# Patient Record
Sex: Female | Born: 1935 | Race: White | Hispanic: No | Marital: Married | Smoking: Former smoker
Health system: Southern US, Community
[De-identification: ages and names within clinical notes are randomized; demographics above are authoritative.]

## PROBLEM LIST (undated history)

## (undated) DIAGNOSIS — R251 Tremor, unspecified: Secondary | ICD-10-CM

## (undated) DIAGNOSIS — R413 Other amnesia: Secondary | ICD-10-CM

## (undated) DIAGNOSIS — M545 Low back pain, unspecified: Secondary | ICD-10-CM

## (undated) DIAGNOSIS — M48061 Spinal stenosis, lumbar region without neurogenic claudication: Principal | ICD-10-CM

## (undated) DIAGNOSIS — G5700 Lesion of sciatic nerve, unspecified lower limb: Secondary | ICD-10-CM

## (undated) DIAGNOSIS — E785 Hyperlipidemia, unspecified: Secondary | ICD-10-CM

## (undated) DIAGNOSIS — I10 Essential (primary) hypertension: Secondary | ICD-10-CM

## (undated) DIAGNOSIS — H269 Unspecified cataract: Secondary | ICD-10-CM

## (undated) DIAGNOSIS — R531 Weakness: Secondary | ICD-10-CM

## (undated) DIAGNOSIS — M797 Fibromyalgia: Secondary | ICD-10-CM

## (undated) HISTORY — DX: Low back pain, unspecified: M54.50

## (undated) HISTORY — PX: APPENDECTOMY: SHX54

## (undated) HISTORY — DX: Low back pain: M54.5

## (undated) HISTORY — DX: Tremor, unspecified: R25.1

## (undated) HISTORY — PX: EYE SURGERY: SHX253

## (undated) HISTORY — DX: Other amnesia: R41.3

## (undated) HISTORY — DX: Weakness: R53.1

## (undated) HISTORY — DX: Lesion of sciatic nerve, unspecified lower limb: G57.00

## (undated) HISTORY — PX: COLONOSCOPY W/ POLYPECTOMY: SHX1380

## (undated) HISTORY — PX: CATARACT EXTRACTION W/ INTRAOCULAR LENS  IMPLANT, BILATERAL: SHX1307

## (undated) HISTORY — DX: Fibromyalgia: M79.7

## (undated) HISTORY — DX: Unspecified cataract: H26.9

## (undated) HISTORY — DX: Spinal stenosis, lumbar region without neurogenic claudication: M48.061

---
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
## 1961-07-26 HISTORY — PX: ABDOMINAL HYSTERECTOMY: SHX81

## 1972-07-26 HISTORY — PX: COLON SURGERY: SHX602

## 1985-07-26 HISTORY — PX: OTHER SURGICAL HISTORY: SHX169

## 1988-07-26 HISTORY — PX: SMALL INTESTINE SURGERY: SHX150

## 1997-10-24 ENCOUNTER — Other Ambulatory Visit: Admission: RE | Admit: 1997-10-24 | Discharge: 1997-10-24 | Payer: Self-pay | Admitting: *Deleted

## 1997-11-14 ENCOUNTER — Other Ambulatory Visit: Admission: RE | Admit: 1997-11-14 | Discharge: 1997-11-14 | Payer: Self-pay | Admitting: *Deleted

## 1998-02-13 ENCOUNTER — Other Ambulatory Visit: Admission: RE | Admit: 1998-02-13 | Discharge: 1998-02-13 | Payer: Self-pay | Admitting: Internal Medicine

## 1998-02-13 ENCOUNTER — Other Ambulatory Visit: Admission: RE | Admit: 1998-02-13 | Discharge: 1998-02-13 | Payer: Self-pay | Admitting: *Deleted

## 2000-07-26 HISTORY — PX: COLON SURGERY: SHX602

## 2001-05-23 ENCOUNTER — Encounter (INDEPENDENT_AMBULATORY_CARE_PROVIDER_SITE_OTHER): Payer: Self-pay | Admitting: *Deleted

## 2001-05-23 ENCOUNTER — Ambulatory Visit (HOSPITAL_COMMUNITY): Admission: RE | Admit: 2001-05-23 | Discharge: 2001-05-23 | Payer: Self-pay | Admitting: Gastroenterology

## 2003-10-07 ENCOUNTER — Ambulatory Visit (HOSPITAL_COMMUNITY): Admission: RE | Admit: 2003-10-07 | Discharge: 2003-10-07 | Payer: Self-pay | Admitting: Specialist

## 2005-10-14 ENCOUNTER — Encounter: Admission: RE | Admit: 2005-10-14 | Discharge: 2005-11-22 | Payer: Self-pay | Admitting: *Deleted

## 2005-11-23 ENCOUNTER — Encounter: Admission: RE | Admit: 2005-11-23 | Discharge: 2006-02-21 | Payer: Self-pay | Admitting: *Deleted

## 2006-02-11 ENCOUNTER — Emergency Department (HOSPITAL_COMMUNITY): Admission: EM | Admit: 2006-02-11 | Discharge: 2006-02-11 | Payer: Self-pay | Admitting: Emergency Medicine

## 2007-06-27 ENCOUNTER — Ambulatory Visit (HOSPITAL_BASED_OUTPATIENT_CLINIC_OR_DEPARTMENT_OTHER): Admission: RE | Admit: 2007-06-27 | Discharge: 2007-06-27 | Payer: Self-pay | Admitting: Urology

## 2007-07-27 HISTORY — PX: KNEE SURGERY: SHX244

## 2007-08-18 ENCOUNTER — Ambulatory Visit: Payer: Self-pay | Admitting: Sports Medicine

## 2007-08-18 DIAGNOSIS — M19041 Primary osteoarthritis, right hand: Secondary | ICD-10-CM | POA: Insufficient documentation

## 2007-08-18 DIAGNOSIS — M19042 Primary osteoarthritis, left hand: Secondary | ICD-10-CM

## 2007-08-18 DIAGNOSIS — I10 Essential (primary) hypertension: Secondary | ICD-10-CM

## 2007-08-18 DIAGNOSIS — M216X9 Other acquired deformities of unspecified foot: Secondary | ICD-10-CM

## 2007-08-18 DIAGNOSIS — Z860100 Personal history of colon polyps, unspecified: Secondary | ICD-10-CM | POA: Insufficient documentation

## 2007-08-18 DIAGNOSIS — M2170 Unequal limb length (acquired), unspecified site: Secondary | ICD-10-CM

## 2007-10-30 ENCOUNTER — Inpatient Hospital Stay (HOSPITAL_COMMUNITY): Admission: AD | Admit: 2007-10-30 | Discharge: 2007-11-02 | Payer: Self-pay | Admitting: Orthopedic Surgery

## 2010-03-18 ENCOUNTER — Encounter: Admission: RE | Admit: 2010-03-18 | Discharge: 2010-03-18 | Payer: Self-pay | Admitting: Orthopedic Surgery

## 2010-07-17 ENCOUNTER — Encounter
Admission: RE | Admit: 2010-07-17 | Discharge: 2010-07-17 | Payer: Self-pay | Source: Home / Self Care | Attending: Orthopedic Surgery | Admitting: Orthopedic Surgery

## 2010-07-26 HISTORY — PX: LUMBAR LAMINECTOMY: SHX95

## 2010-08-19 ENCOUNTER — Encounter
Admission: RE | Admit: 2010-08-19 | Discharge: 2010-08-19 | Payer: Self-pay | Source: Home / Self Care | Attending: Orthopedic Surgery | Admitting: Orthopedic Surgery

## 2010-10-29 ENCOUNTER — Other Ambulatory Visit: Payer: Self-pay | Admitting: Orthopedic Surgery

## 2010-10-29 DIAGNOSIS — M48061 Spinal stenosis, lumbar region without neurogenic claudication: Secondary | ICD-10-CM

## 2010-11-04 ENCOUNTER — Ambulatory Visit
Admission: RE | Admit: 2010-11-04 | Discharge: 2010-11-04 | Disposition: A | Discharge status: Home / Self Care | Payer: Self-pay | Source: Ambulatory Visit | Attending: Orthopedic Surgery | Admitting: Orthopedic Surgery

## 2010-11-04 DIAGNOSIS — M48061 Spinal stenosis, lumbar region without neurogenic claudication: Secondary | ICD-10-CM

## 2010-12-08 NOTE — Op Note (Signed)
NAMECARLINA, [name]                   ACCOUNT NO.:  [id number]   MEDICAL RECORD NO.:  [id number]          PATIENT TYPE:  AMB   LOCATION:  [facility]                         FACILITY:  [facility]   PHYSICIAN:  Jamison Neighbor, M.D.  DATE OF BIRTH:  [birth year]   DATE OF PROCEDURE:  06/27/2007  DATE OF DISCHARGE:                               OPERATIVE REPORT   PREOPERATIVE DIAGNOSES:  1. Interstitial cystitis.  2. Urgency incontinence.   POSTOPERATIVE DIAGNOSES:  1. Interstitial cystitis.  2. Urgency incontinence.   PROCEDURE:  Cystoscopy, urethral calibration, hydrodistention of bladder  Marcaine and Pyridium installation, Marcaine and Kenalog injection,  Botox injections x2.   SURGEON:  Jamison Neighbor, M.D.   ANESTHESIA:  General.   COMPLICATIONS:  None.   DRAINS:  None.   BRIEF HISTORY:  This 77 year old female has had more problems with  interstitial cystitis as well as urgency incontinence.  The patient is  interested in seeing what could be done to help with the poorly  controlled urgency.  She has not responded well to IC type therapy in  terms of control of her frequency.  Anticholinergics have not worked  well and the patient was given three options.  The first was to be  considered for InterStim.  The second was to be considered for  percutaneous tibial nerve stimulation and third was to try Botox.  The  patient was opposed to the possibility of having an InterStim implant  placed.  She liked idea of percutaneous tibial nerve stimulation but  this is currently not covered by her insurance carrier and we could not  get her into a clinical trial.  The patient, however is interested in a  Botox injections.  She knows that this will have to be done at the same  time as the hydrodistention.  She is aware of potential risk for  retention.  She is willing to wear a catheter and do self-  catheterization if need be.  Full informed consent was obtained.   PROCEDURE:  After successful  induction of general anesthesia, the  patient was placed in the dorsal position, prepped with Betadine and  draped in the usual sterile fashion.  Careful bimanual examination  revealed a very modest cystocele, modest rectocele with no real vault  prolapse.  No enterocele and pretty good support for urethra.  There was  nothing suggestive of urethral diverticulum or other irregularities.  Urethra was calibrated to 32-French with female urethral sounds with no  evidence of stenosis or stricture.  The cystoscope was inserted.  The  bladder was carefully inspected.  No tumors or stones could be seen.  Both ureteral orifices normal configuration and location.  Hydrodistention of bladder was performed.  The bladder was distended at  a pressure of 100 cm of water for five minutes.  When the bladder was  drained, glomerulations could be seen throughout the bladder.  There was  one area that was slightly ulcerated.  The patient also had white patchy  areas consistent with fibrous tissue from longstanding bladder problems.  The patient had nothing  that was suspicious for carcinoma in situ or  that required biopsy.  The patient then underwent Botox injections x2  with total of 20 injections scattered throughout the bladder including  the trigone.  The bladder was drained.  A mixture of Marcaine and  Pyridium was left in the bladder Marcaine and Kenalog were injected  periurethrally.  The patient tolerated the procedure well and was taken  to recovery room in good condition.  She will be sent home with Lorcet  Plus, Pyridium Plus and doxycycline and return to see [place] in follow-up.  Will make sure that she can urinate normally before discharge and if  need be she can go home with a leg bag and catheter.      Jamison Neighbor, M.D.  Electronically Signed     RJE/MEDQ  D:  06/27/2007  T:  06/27/2007  Job:  [id number]

## 2010-12-08 NOTE — Op Note (Signed)
NAMEAGAPE, HARDIMAN                   ACCOUNT NO.:  [id number]   MEDICAL RECORD NO.:  [id number]          PATIENT TYPE:  INP   LOCATION:  2550                         FACILITY:  [facility]   PHYSICIAN:  Mila Homer. Sherlean Foot, M.D. DATE OF BIRTH:  [birth year]   DATE OF PROCEDURE:  10/30/2007  DATE OF DISCHARGE:                               OPERATIVE REPORT   SURGEON:  Mila Homer. Sherlean Foot, M.D.   ASSISTANT:  Legrand Pitts. Duffy, P.A.   ANESTHESIA:  Spinal.   PREOPERATIVE DIAGNOSIS:  Left knee osteoarthritis.   POSTOPERATIVE DIAGNOSIS:  Left knee osteoarthritis.   PROCEDURE:  Left total knee arthroplasty.   INDICATIONS FOR PROCEDURE:  The patient is a 77 year old white female  who failed conservative measures for osteoarthritis of the left knee.  Informed consent was obtained.   DESCRIPTION OF PROCEDURE:  The patient laid supine and administered  spinal anesthesia.  A Foley catheter placed and the left leg was prepped  and prepped in the usual sterile fashion.  The extremity was  exsanguinated with the Esmarch and tourniquet inflated to 350 mmHg.  A  #10 blade was used to make a midline incision approximately 6 inches  in  length.  A new blade was used to make a medial parapatellar arthrotomy  and perform a synovectomy.  The deep MCL was elevated off the medial  crest of the tibia, but not going much distally since this was a valgus  knee.  I then everted the patella and reamed down to 12 mm because it  was 21 mm thick and drilled three lug holes.  With 35 mm template in  place, we created a 21-mm thickness.  Then went into flexion and  subluxed the patella laterally.  I then used the intermedullary system  on the tibia to make a perpendicular cut to the anatomic axis of the  tibia.  I used the intramedullary system on the femur to make a 4 degree  valgus cut on the distal femur.  I marked out the epicondylar angle and  the posterior condylar angle measured 3 degrees.  I sized to a size E  pinned  it to the 3 degrees external rotation holes.  I then made the  anterior-posterior chamfer cuts through the E cutting block.  I then  placed a lamina spreader in the knee and removed the ACL, PCL, medial  and lateral menisci and posterior condylar osteophytes.  I then placed  the 2-mm spacer block in the knee and then finished the femur with a  size E finishing block, tibial size 5, tibial tray drilled and keeled.  Then trialed with a size 5 tibial sizer, femur size thin insert size 35.  Patella had good flexion/extension gap, balanced, and angle back to 125  degrees to 130 degrees and copiously irrigated.  I removed the trial  components and copiously irrigated.  I then cemented in the components,  removed excess cement with the leg in extension and I snapped in the  real  polyethylene and allowed the cement harden in extension.  I left a  Hemovac  coming out superolateral  deep to the arthrotomy.  I left a pain  catheter coming out superomedially and superficial to the arthrotomy.  I  let the tourniquet down and obtained hemostasis.  I copiously irrigated  again.  I closed the arthrotomy with figure-of-eight #1 Vicryl sutures,  deep soft tissue with buried 0 Vicryl suture, subcuticular with 2-0  Vicryl stitch and skin staples.  Dressed with Xeroform dressings,  sponges, Webril and TED stocking.   COMPLICATIONS:  None.   DRAINS:  One Hemovac and one pain catheter.   ESTIMATED BLOOD LOSS:  300 mL.   TOURNIQUET TIME:  45 minutes.           ______________________________  Mila Homer Sherlean Foot, M.D.     SDL/MEDQ  D:  10/30/2007  T:  10/30/2007  Job:  [id number]

## 2010-12-11 NOTE — Procedures (Signed)
[place]  Patient:    Kristie [name], Kristie [name] Visit Number: [id number] MRN: [id number]          Service Type: END Location: ENDO Attending Physician:  Charna Elizabeth Dictated by:   Anselmo Rod, M.D. Proc. Date: 05/23/01 Admit Date:  05/23/2001   CC:         Lilia Pro, M.D.   Procedure Report  DATE OF BIRTH:  04-Jul-1929.  PROCEDURE:  Colonoscopy with snare polypectomy x 2.  ENDOSCOPIST:  Anselmo Rod, M.D.  INSTRUMENT USED:  Olympus video colonoscope (pediatric).  INDICATION FOR PROCEDURE:  Personal history of polyps in a 77 year old white female.  Rule out recurrent polyps.  PREPROCEDURE PREPARATION:  Informed consent was procured from the patient. The patient was fasted for eight hours prior to the procedure and prepped with a bottle of magnesium citrate and a gallon of NuLytely the night prior to the procedure.  PREPROCEDURE PHYSICAL:  VITAL SIGNS:  The patient had stable vital signs.  NECK:  Supple.  CHEST:  Clear to auscultation.  S1, S2 regular.  ABDOMEN:  Soft with normal bowel sounds.  DESCRIPTION OF PROCEDURE:  The patient was placed in the left lateral decubitus position and sedated with 100 mcg of fentanyl and 10 mg of Versed intravenously.  Once the patient was adequately sedate and maintained on low-flow oxygen and continuous cardiac monitoring, the Olympus video colonoscope was advanced from the rectum to the cecum with slight difficulty secondary to a very tortuous colon.  Small internal hemorrhoids were appreciated on retroflexion in the rectum.  A small polyp was snared from 30 cm, and another sessile polyp was snared from the proximal right colon.  There was extensive left-sided diverticulosis.  No large masses or polyps were seen.  IMPRESSION: 1. A small, nonbleeding internal hemorrhoid. 2. Left-sided diverticulosis. 3. Two small polyps snared from the colon, one from the left colon and one    from  the right colon.  RECOMMENDATIONS: 1. Await pathology results. 2. Repeat colorectal cancer screening depending on the pathology results. 3. A high-fiber diet. 4. Outpatient follow-up on a p.r.n. basis. Dictated by:   Anselmo Rod, M.D. Attending Physician:  Charna Elizabeth DD:  05/23/01 TD:  05/24/01 Job: [id number]

## 2010-12-11 NOTE — Discharge Summary (Signed)
NAMEADRAINE, BIFFLE                   ACCOUNT NO.:  [id number]   MEDICAL RECORD NO.:  [id number]          PATIENT TYPE:  INP   LOCATION:  5040                         FACILITY:  [facility]   PHYSICIAN:  Erskine Squibb B. Su Hilt, P.A.  DATE OF BIRTH:  08/12/1928   DATE OF ADMISSION:  10/30/2007  DATE OF DISCHARGE:  11/02/2007                               DISCHARGE SUMMARY   ADMITTING SURGEON:  Dr. Georgena Spurling M.D.   FINAL DIAGNOSIS:  End-stage osteoarthritis, left knee.   PROCEDURE WHILE IN HOSPITAL:  Left total knee arthroplasty.   HISTORY OF PRESENT ILLNESS:  The patient is a 77 year old woman who  presents with a 2-3 year history of gradually increasing left knee pain.  No preceding injury.  Pain is constant ache which varies in severity  from short to diffuse about the joint.  No radiation.  Increased pain  with intensive stairs or prolonged use, and is adequate-to-extremely  stiff after long periods of sitting.  Decreased pain with increased  range of motion, Mobic, and rest.  The patient has reported positive  catching and near giving way as well as night pain that prevents sleep.  Cortisone gave temporary relief to the injection.  She also uses a cane  secondary to foot drop on the right leg.  After discussing risks versus  benefits of the surgery, the patient wishes to proceed with a total knee  arthroplasty for her left knee.   ALLERGIES:  The patient's medical history is significant for allergies  to DEMEROL.   PAST MEDICAL HISTORY:  She has previous medical history for,  1. Hypertension.  2. Fibromyalgia.  3. Osteopenia.  4. Anxiety.  5. Heart murmur.  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  7. Overactive bladder.   PAST SURGICAL HISTORY:  1. Tonsillectomy.  2. Thyroid cyst.  3. Hysterectomy.  4. Appendectomy.  5. Colon polyp.  6. Schwannoma.  7. Bowel obstruction.  8. Colon polyps.  9. Cataracts.  10.No complications from  surgery.   MEDICATIONS UPON ADMISSION:  1. Neurontin.  2. Mobic.  3. Zoloft.  4. Atenolol.  5. Norvasc.  6. Ambien.  7. Fosamax.  8. VESIcare.  9. Aricept.   SOCIAL HISTORY:  The patient last smoked 1 pack per day in the 1980s.  The patient has approximately 2 alcohol beverages per day.  She does not  use any other substances.   FAMILY HISTORY:  She reports family history of mother who died in her  64s with a history of heart disease, hypertension, and breast cancer.  Father died in his 31s with a history of Alzheimer.   REVIEW OF SYSTEMS:  Positive for blurred vision, cataracts, glasses,  high blood pressure, heart murmur, cancer, and partial paralysis due to  the schwannoma.   PREOPERATIVE LABS:  Including CBC, CMET, chest x-ray, EKG, PT, and PTT  were within normal limits with exception of hemoglobin of 15.2.   HOSPITAL COURSE:  On the day of admission, the patient was taken to the  operating  room at [facility] where she underwent a left total knee  arthroplasty using NexGen components from [employer] and #5 Stem tibia, a  35, 9.0-mm thickness all poly patella, a size E to the left femoral  component with a 10 mm posterior stabilized spacer.  All components  cemented.  The patient was placed on perioperative antibiotics.  She was  placed on postoperative Lovenox for prophylaxis of DVT.  She was placed  on a  Marcaine pain pump for pain control and physical therapy was begun in  the PACU using CPM.  Postop day #1, the patient was afebrile.  Hemoglobin 11.9.  Other vitals stable.  The patient complained of pain,  well controlled by pain medication.  Her Hemovac was discontinued.  Wound was clean and dry.  Marcaine pump continued for pain control and  was otherwise stable.  After her total knee, physical therapy was begun  in earnest with the physical therapist and occupational therapist.  Postoperative day 2, the patient continued to report moderate pain.  A  Foley was  discontinued without difficulty.  Hemoglobin 10.9, T-max  100.1, respirations 18, pulse 103, blood pressure 135/77.  Dressing was  changed.  Marcaine pump was discontinued.  Calf remained soft and  nontender.  She is otherwise neurovascularly intact at her baseline with  a chronic right foot drop.  Postoperative day #3, the patient's pain had  decreased to 2.  Was tolerating her diet.  T-max 101.6 but had wrenched  down to 98.6, pulse of 84, hemoglobin 10.0, WBC 7.3.  The patient was  otherwise alert and oriented with clean wound.  Calf was soft and  nontender.  She was neurovascularly intact with the exception of chronic  right foot drop.  She had made steady physical therapy progress and was  transferring independently and safely ambulating with only minimal  assist using her walker.  She was judged to be safe to return to her  home, so she was discharged home under the care of her family.  She will  continue with physical therapy which will be provided by her home health  agency.  She will continue to use her CPM and should be set up for home  use.  She will use a walker for ambulation and activity weightbearing as  tolerated with total knee precautions.  She will continue to use TED  hose and was encouraged to ambulate and exercise regularly.   MEDICATIONS AT THE TIME OF DISCHARGE:  1. VESIcare 10 mg p.o. daily.  2. Sertraline 50 mg one-half tablet daily.  3. Alendronate 70 mg every week.  4. Benicar 20 mg daily  5. Amlodipine 5 mg daily  6. Gabapentin 800 mg 3 times a day.  7. Aricept 10 mg daily.  8. Zolpidem 10 mg one-half tablet nightly.  9. Meloxicam 15 mg one-half tablet daily.  10.Vitamin D once weekly 50,000 units.  11.Primadophilus daily.  12.Multivitamins daily.  13.Tums Ultra as necessary.  14.She was given prescriptions for Lovenox 40 mg subcu daily as well      as Vicodin 1 p.o. q.4-6 h. p.r.n. pain.   At the time of discharge, she was weightbearing as tolerated.   Dressing  changes daily.  She should return to see Dr. Sherlean Foot in 2 week's time for  followup check or sooner should she have any increase in drainage that  is purulent or odoriferous, any type of pain that is not well controlled  by oral pain medications.  Dressing changes daily.  Diet is regular.  The patient will continue with CPM until she is bending easily, 0-90.  She will continue with home health PT.   FINAL DIAGNOSIS AT DISCHARGE:  End-stage arthritis of the left knee.   PROCEDURE WHILE IN HOSPITAL:  Left total knee arthroplasty.      Laural Benes. Judithe Modest  D:  11/27/2007  T:  11/28/2007  Job:  [id number]

## 2011-01-12 ENCOUNTER — Other Ambulatory Visit: Payer: Self-pay | Admitting: Orthopedic Surgery

## 2011-01-12 DIAGNOSIS — M48061 Spinal stenosis, lumbar region without neurogenic claudication: Secondary | ICD-10-CM

## 2011-01-18 ENCOUNTER — Other Ambulatory Visit: Payer: Medicare Other

## 2011-02-01 ENCOUNTER — Ambulatory Visit
Admission: RE | Admit: 2011-02-01 | Discharge: 2011-02-01 | Disposition: A | Discharge status: Home / Self Care | Payer: Medicare Other | Source: Ambulatory Visit | Attending: Orthopedic Surgery | Admitting: Orthopedic Surgery

## 2011-02-01 DIAGNOSIS — M48061 Spinal stenosis, lumbar region without neurogenic claudication: Secondary | ICD-10-CM

## 2011-04-20 LAB — COMPREHENSIVE METABOLIC PANEL
AST: 22
Calcium: 9.6
Chloride: 110
Creatinine, Ser: 0.78
Glucose, Bld: 96
Potassium: 4.1
Sodium: 143

## 2011-04-20 LAB — BASIC METABOLIC PANEL
Calcium: 8.6
Calcium: 8.6
Chloride: 102
Creatinine, Ser: 0.66
Creatinine, Ser: 0.81
GFR calc Af Amer: >60
GFR calc Af Amer: >60
GFR calc Af Amer: >60
GFR calc non Af Amer: >60
Potassium: 3.4 — ABNORMAL LOW
Sodium: 136
Sodium: 141

## 2011-04-20 LAB — DIFFERENTIAL
Basophils Absolute: 0
Basophils Relative: 1
Eosinophils Absolute: 0.1
Eosinophils Relative: 1
Lymphocytes Relative: 39
Lymphs Abs: 2.2
Monocytes Relative: 10
Neutro Abs: 2.8

## 2011-04-20 LAB — CBC
HCT: 28.9 — ABNORMAL LOW
HCT: 35.2 — ABNORMAL LOW
Hemoglobin: 10 — ABNORMAL LOW
Hemoglobin: 10.9 — ABNORMAL LOW
Hemoglobin: 15.2 — ABNORMAL HIGH
MCHC: 33.9
MCV: 92.5
MCV: 92.6
Platelets: 146 — ABNORMAL LOW
Platelets: 186
RBC: 3.14 — ABNORMAL LOW
RBC: 3.45 — ABNORMAL LOW
RDW: 13.3
RDW: 13.4
RDW: 13.7
RDW: 13.8

## 2011-04-20 LAB — CROSSMATCH

## 2011-04-20 LAB — PROTIME-INR
INR: 0.9
Prothrombin Time: 12.4

## 2011-04-20 LAB — URINALYSIS, ROUTINE W REFLEX MICROSCOPIC
Hgb urine dipstick: NEGATIVE
Ketones, ur: NEGATIVE
Nitrite: NEGATIVE
Protein, ur: NEGATIVE

## 2011-04-20 LAB — URINE CULTURE

## 2011-04-20 LAB — ABO/RH: ABO/RH(D): O POS

## 2011-05-03 LAB — POCT I-STAT 4, (NA,K, GLUC, HGB,HCT)
Glucose, Bld: 107 — ABNORMAL HIGH
Hemoglobin: 15.6 — ABNORMAL HIGH
Operator id: 114531
Sodium: 139

## 2011-05-05 ENCOUNTER — Other Ambulatory Visit: Payer: Self-pay | Admitting: Orthopedic Surgery

## 2011-05-05 DIAGNOSIS — M545 Low back pain: Secondary | ICD-10-CM

## 2011-05-05 DIAGNOSIS — M47819 Spondylosis without myelopathy or radiculopathy, site unspecified: Secondary | ICD-10-CM

## 2011-05-10 ENCOUNTER — Ambulatory Visit
Admission: RE | Admit: 2011-05-10 | Discharge: 2011-05-10 | Disposition: A | Discharge status: Home / Self Care | Payer: Medicare Other | Source: Ambulatory Visit | Attending: Orthopedic Surgery | Admitting: Orthopedic Surgery

## 2011-05-10 DIAGNOSIS — M545 Low back pain: Secondary | ICD-10-CM

## 2011-05-10 DIAGNOSIS — M47819 Spondylosis without myelopathy or radiculopathy, site unspecified: Secondary | ICD-10-CM

## 2011-05-10 MED ORDER — METHYLPREDNISOLONE ACETATE 40 MG/ML INJ SUSP (RADIOLOG
120.0000 mg | Freq: Once | INTRAMUSCULAR | Status: AC
  Administered 2011-05-10: 120 mg via EPIDURAL

## 2011-05-10 MED ORDER — IOHEXOL 180 MG/ML  SOLN
1.0000 mL | Freq: Once | INTRAMUSCULAR | Status: AC | PRN
  Administered 2011-05-10: 1 mL via EPIDURAL

## 2011-09-15 DIAGNOSIS — N3941 Urge incontinence: Secondary | ICD-10-CM | POA: Insufficient documentation

## 2012-04-13 ENCOUNTER — Encounter (HOSPITAL_COMMUNITY): Payer: Self-pay | Admitting: *Deleted

## 2012-04-13 ENCOUNTER — Emergency Department (HOSPITAL_COMMUNITY): Payer: Medicare Other

## 2012-04-13 ENCOUNTER — Inpatient Hospital Stay (HOSPITAL_COMMUNITY)
Admission: EM | Admit: 2012-04-13 | Discharge: 2012-04-16 | DRG: 536 | Disposition: A | Discharge status: Home Health | Payer: Medicare Other | Attending: Internal Medicine | Admitting: Internal Medicine

## 2012-04-13 DIAGNOSIS — M24873 Other specific joint derangements of unspecified ankle, not elsewhere classified: Secondary | ICD-10-CM

## 2012-04-13 DIAGNOSIS — R110 Nausea: Secondary | ICD-10-CM

## 2012-04-13 DIAGNOSIS — W010XXA Fall on same level from slipping, tripping and stumbling without subsequent striking against object, initial encounter: Secondary | ICD-10-CM | POA: Diagnosis present

## 2012-04-13 DIAGNOSIS — G609 Hereditary and idiopathic neuropathy, unspecified: Secondary | ICD-10-CM | POA: Diagnosis present

## 2012-04-13 DIAGNOSIS — E785 Hyperlipidemia, unspecified: Secondary | ICD-10-CM

## 2012-04-13 DIAGNOSIS — Y998 Other external cause status: Secondary | ICD-10-CM

## 2012-04-13 DIAGNOSIS — I10 Essential (primary) hypertension: Secondary | ICD-10-CM

## 2012-04-13 DIAGNOSIS — M2170 Unequal limb length (acquired), unspecified site: Secondary | ICD-10-CM

## 2012-04-13 DIAGNOSIS — M1990 Unspecified osteoarthritis, unspecified site: Secondary | ICD-10-CM

## 2012-04-13 DIAGNOSIS — E876 Hypokalemia: Secondary | ICD-10-CM

## 2012-04-13 DIAGNOSIS — Z860100 Personal history of colon polyps, unspecified: Secondary | ICD-10-CM

## 2012-04-13 DIAGNOSIS — M216X9 Other acquired deformities of unspecified foot: Secondary | ICD-10-CM

## 2012-04-13 DIAGNOSIS — S32599A Other specified fracture of unspecified pubis, initial encounter for closed fracture: Secondary | ICD-10-CM

## 2012-04-13 DIAGNOSIS — Z87891 Personal history of nicotine dependence: Secondary | ICD-10-CM

## 2012-04-13 DIAGNOSIS — Z9089 Acquired absence of other organs: Secondary | ICD-10-CM

## 2012-04-13 DIAGNOSIS — Y92009 Unspecified place in unspecified non-institutional (private) residence as the place of occurrence of the external cause: Secondary | ICD-10-CM

## 2012-04-13 DIAGNOSIS — S32509A Unspecified fracture of unspecified pubis, initial encounter for closed fracture: Principal | ICD-10-CM

## 2012-04-13 HISTORY — DX: Hyperlipidemia, unspecified: E78.5

## 2012-04-13 HISTORY — DX: Essential (primary) hypertension: I10

## 2012-04-13 LAB — CBC
MCHC: 34.3 g/dL (ref 30.0–36.0)
Platelets: 198 10*3/uL (ref 150–400)
RDW: 13.8 % (ref 11.5–15.5)
WBC: 9.9 10*3/uL (ref 4.0–10.5)

## 2012-04-13 LAB — BASIC METABOLIC PANEL
BUN: 20 mg/dL (ref 6–23)
Chloride: 101 mEq/L (ref 96–112)
GFR calc Af Amer: >90 mL/min (ref >90)
GFR calc non Af Amer: 79 mL/min — ABNORMAL LOW (ref >90)
Potassium: 3.3 mEq/L — ABNORMAL LOW (ref 3.5–5.1)
Sodium: 140 mEq/L (ref 135–145)

## 2012-04-13 MED ORDER — HYDROCODONE-ACETAMINOPHEN 5-325 MG PO TABS
1.0000 | ORAL_TABLET | Freq: Once | ORAL | Status: AC
  Administered 2012-04-13: 1 via ORAL
  Filled 2012-04-13: qty 1

## 2012-04-13 NOTE — ED Provider Notes (Signed)
History     CSN: [id number]  Arrival date & time 04/13/12  2156   First MD Initiated Contact with Patient 04/13/12 2158      Chief Complaint  Patient presents with  . Fall    (Consider location/radiation/quality/duration/timing/severity/associated sxs/prior treatment) HPI Comments: 78 year old female w a hx of HTN & OA  presents to the emergency department with a chief complaint of right hip pain status post mechanical fall.  Incident occurred approximately one hour ago when patient noticed stepped falling to the right.  Point of impact was right hip.  Patient denies any pain at rest but reports pain with weightbearing.  Patient ambulates at baseline with a cane and states that she is unable to do this since the fall.  Patient denies hitting her head, loss of consciousness, change in vision, chest pain, difficulty breathing, shortness of breath, numbness or tingling of her lower cavities, neck pain, or back pain. Pt reports taking a baby Asprin daily and occasionally tylenol for pain, but denies being on any blood thinners. Note: patient has a history of right foot paralysis.   Patient is a 78 y.o. female presenting with fall. The history is provided by the patient.  Fall Pertinent negatives include no fever and no headaches.    No past medical history on file.  No past surgical history on file.  No family history on file.  History  Substance Use Topics  . Smoking status: Not on file  . Smokeless tobacco: Not on file  . Alcohol Use: No    OB History    Grav Para Term Preterm Abortions TAB SAB Ect Mult Living                  Review of Systems  Constitutional: Negative for fever, diaphoresis and activity change.  HENT: Negative for congestion and neck pain.   Respiratory: Negative for cough.   Genitourinary: Negative for dysuria.  Musculoskeletal: Positive for gait problem. Negative for myalgias.  Skin: Negative for color change and wound.  Neurological: Negative for  headaches.  All other systems reviewed and are negative.    Allergies  Review of patient's allergies indicates no known allergies.  Home Medications   Current Outpatient Rx  Name Route Sig Dispense Refill  . ACETAMINOPHEN 325 MG PO TABS Oral Take 325 mg by mouth every 6 (six) hours as needed. For pain    . RISAQUAD PO CAPS Oral Take 1 capsule by mouth daily.    Marland Kitchen AMLODIPINE BESYLATE 10 MG PO TABS Oral Take 10 mg by mouth daily.    . ASPIRIN 81 MG PO CHEW Oral Chew 81 mg by mouth daily.    . ATENOLOL 100 MG PO TABS Oral Take 100 mg by mouth daily.    Marland Kitchen VITAMIN D 1000 UNITS PO TABS Oral Take 5,000 Units by mouth daily.    . DESMOPRESSIN ACETATE 0.2 MG PO TABS Oral Take 0.2 mg by mouth daily.    . DONEPEZIL HCL 23 MG PO TABS Oral Take 23 mg by mouth at bedtime.    Marland Kitchen GABAPENTIN 800 MG PO TABS Oral Take 800 mg by mouth 3 (three) times daily.    . IBUPROFEN 200 MG PO TABS Oral Take 200 mg by mouth every 6 (six) hours as needed. For pain    . LOSARTAN POTASSIUM 50 MG PO TABS Oral Take 50 mg by mouth daily.    . MELOXICAM 15 MG PO TABS Oral Take 7.5 mg by mouth daily.    Marland Kitchen  MIRABEGRON ER 25 MG PO TB24 Oral Take 25 mg by mouth daily.    . ADULT MULTIVITAMIN W/MINERALS CH Oral Take 1 tablet by mouth daily.    . SERTRALINE HCL 50 MG PO TABS Oral Take 50 mg by mouth daily.    Marland Kitchen SIMVASTATIN 10 MG PO TABS Oral Take 10 mg by mouth at bedtime.    . SOLIFENACIN SUCCINATE 10 MG PO TABS Oral Take by mouth daily.    Marland Kitchen ZOLPIDEM TARTRATE 10 MG PO TABS Oral Take 10 mg by mouth at bedtime as needed. For sleep      BP 120/70  Pulse 70  Temp 97.9 F (36.6 C) (Oral)  Resp 18  SpO2 95%  Physical Exam  Nursing note and vitals reviewed. Constitutional: She appears well-developed and well-nourished. No distress.  HENT:  Head: Normocephalic and atraumatic.  Eyes: Conjunctivae normal and EOM are normal.  Neck: Normal range of motion. Neck supple.  Cardiovascular:       Intact distal pulses, capillary  refill < 3 seconds  Musculoskeletal:       Pain with right leg eversion/inversion, no ttp of hips bilaterally or coccyx. All other extremities with normal ROM  Neurological:       No sensory deficit  Skin: She is not diaphoretic.       Skin intact, no tenting    ED Course  Procedures (including critical care time)   Labs Reviewed  CBC  BASIC METABOLIC PANEL   Dg Hip Complete Right  04/13/2012  *RADIOLOGY REPORT*  Clinical Data: Fall.  Right hip pain.  RIGHT HIP - COMPLETE 2+ VIEW  Comparison: With  Findings: The patient has acute or right superior and inferior pubic rami fractures.  No other fracture is identified.  Left worse than right hip osteoarthritis is seen.  Postoperative change lower lumbar spine and right pelvis noted.  IMPRESSION:  1.  Acute right superior and inferior pubic rami fractures. 2.  Left worse than right hip osteoarthritis.   Original Report Authenticated By: Bernadene Bell. Maricela Curet, M.D.     No diagnosis found.   MDM  Fall, right sided hip pain Rami Fractures  78yo F presenting with right hip pain s/p mechanical fall earlier this evening. Pt ambulates at baseline with cane, but is unable to do so d/t pain with weight bearing. She denies pain when sitting. Pt unable to ambulate and will be admitted for PT/OT. The patient appears reasonably stabilized for admission considering the current resources, flow, and capabilities available in the ED at this time, and I doubt any other [facility] requiring further screening and/or treatment in the ED prior to admission. Pt seen with Dr. Weldon Inches who agrees with plan to admit.   PCP: Dr. Thayer Headings  Orthopedic: Dr. Shella Spearing, [place] 04/13/12 2328

## 2012-04-13 NOTE — ED Notes (Signed)
Pt is from home, pt has paralysis in right foot and wears a brace. Pt fell and landed on right hip. Pt was unable to bear weight at the scene. Pain is 2/10. VSS. CBG 108

## 2012-04-13 NOTE — ED Provider Notes (Signed)
Medical screening examination/treatment/procedure(s) were conducted as a shared visit with non-physician practitioner(s) and myself.  I personally evaluated the patient during the encounter Walks with cane.  Stumbled and fell.  C/o pelvic pain.   Can't walk now.   xr + right sup and inf pubic rami fxs.   Will admit for pain control   [name], MD 04/13/12 2315

## 2012-04-14 ENCOUNTER — Encounter (HOSPITAL_COMMUNITY): Payer: Self-pay | Admitting: Internal Medicine

## 2012-04-14 DIAGNOSIS — E876 Hypokalemia: Secondary | ICD-10-CM | POA: Diagnosis present

## 2012-04-14 DIAGNOSIS — E785 Hyperlipidemia, unspecified: Secondary | ICD-10-CM

## 2012-04-14 DIAGNOSIS — S32509A Unspecified fracture of unspecified pubis, initial encounter for closed fracture: Secondary | ICD-10-CM

## 2012-04-14 DIAGNOSIS — I10 Essential (primary) hypertension: Secondary | ICD-10-CM

## 2012-04-14 MED ORDER — ACETAMINOPHEN 325 MG PO TABS
650.0000 mg | ORAL_TABLET | Freq: Four times a day (QID) | ORAL | Status: DC | PRN
  Administered 2012-04-14 – 2012-04-16 (×5): 650 mg via ORAL
  Filled 2012-04-14 (×5): qty 2

## 2012-04-14 MED ORDER — RISAQUAD PO CAPS
1.0000 | ORAL_CAPSULE | Freq: Every day | ORAL | Status: DC
  Administered 2012-04-14 – 2012-04-16 (×3): 1 via ORAL
  Filled 2012-04-14 (×3): qty 1

## 2012-04-14 MED ORDER — DESMOPRESSIN ACETATE 0.2 MG PO TABS
0.2000 mg | ORAL_TABLET | Freq: Every day | ORAL | Status: DC
Start: 2012-04-14 — End: 2012-04-16
  Administered 2012-04-14 – 2012-04-16 (×3): 0.2 mg via ORAL
  Filled 2012-04-14 (×3): qty 1

## 2012-04-14 MED ORDER — VITAMIN D3 25 MCG (1000 UNIT) PO TABS
5000.0000 [IU] | ORAL_TABLET | Freq: Every day | ORAL | Status: DC
  Administered 2012-04-14 – 2012-04-16 (×3): 5000 [IU] via ORAL
  Filled 2012-04-14 (×3): qty 5

## 2012-04-14 MED ORDER — ZOLPIDEM TARTRATE 10 MG PO TABS
10.0000 mg | ORAL_TABLET | Freq: Every day | ORAL | Status: DC
  Administered 2012-04-14 – 2012-04-15 (×2): 10 mg via ORAL
  Filled 2012-04-14 (×2): qty 1

## 2012-04-14 MED ORDER — TRAMADOL HCL 50 MG PO TABS
50.0000 mg | ORAL_TABLET | Freq: Four times a day (QID) | ORAL | Status: DC | PRN
  Administered 2012-04-14 – 2012-04-16 (×5): 50 mg via ORAL
  Filled 2012-04-14 (×5): qty 1

## 2012-04-14 MED ORDER — SODIUM CHLORIDE 0.9 % IV SOLN
INTRAVENOUS | Status: DC
Start: 2012-04-14 — End: 2012-04-16

## 2012-04-14 MED ORDER — AMLODIPINE BESYLATE 10 MG PO TABS
10.0000 mg | ORAL_TABLET | Freq: Every day | ORAL | Status: DC
  Administered 2012-04-15: 10 mg via ORAL
  Filled 2012-04-14 (×2): qty 1

## 2012-04-14 MED ORDER — SERTRALINE HCL 50 MG PO TABS
50.0000 mg | ORAL_TABLET | Freq: Every day | ORAL | Status: DC
Start: 2012-04-14 — End: 2012-04-16
  Administered 2012-04-14 – 2012-04-16 (×3): 50 mg via ORAL
  Filled 2012-04-14 (×3): qty 1

## 2012-04-14 MED ORDER — SIMVASTATIN 10 MG PO TABS
10.0000 mg | ORAL_TABLET | Freq: Every day | ORAL | Status: DC
  Administered 2012-04-14 – 2012-04-15 (×2): 10 mg via ORAL
  Filled 2012-04-14 (×4): qty 1

## 2012-04-14 MED ORDER — LOSARTAN POTASSIUM 50 MG PO TABS
50.0000 mg | ORAL_TABLET | Freq: Every day | ORAL | Status: DC
  Administered 2012-04-15: 50 mg via ORAL
  Filled 2012-04-14 (×2): qty 1

## 2012-04-14 MED ORDER — DOCUSATE SODIUM 100 MG PO CAPS
100.0000 mg | ORAL_CAPSULE | Freq: Two times a day (BID) | ORAL | Status: DC
  Administered 2012-04-14 – 2012-04-16 (×4): 100 mg via ORAL
  Filled 2012-04-14 (×4): qty 1

## 2012-04-14 MED ORDER — POTASSIUM CHLORIDE CRYS ER 20 MEQ PO TBCR
40.0000 meq | EXTENDED_RELEASE_TABLET | Freq: Once | ORAL | Status: AC
  Administered 2012-04-14: 40 meq via ORAL
  Filled 2012-04-14: qty 2

## 2012-04-14 MED ORDER — MIRABEGRON ER 25 MG PO TB24
25.0000 mg | ORAL_TABLET | Freq: Every day | ORAL | Status: DC
  Administered 2012-04-14 – 2012-04-16 (×3): 25 mg via ORAL
  Filled 2012-04-14 (×3): qty 1

## 2012-04-14 MED ORDER — INFLUENZA VIRUS VACC SPLIT PF IM SUSP
0.5000 mL | INTRAMUSCULAR | Status: AC
  Administered 2012-04-15: 0.5 mL via INTRAMUSCULAR
  Filled 2012-04-14: qty 0.5

## 2012-04-14 MED ORDER — ADULT MULTIVITAMIN W/MINERALS CH
1.0000 | ORAL_TABLET | Freq: Every day | ORAL | Status: DC
  Administered 2012-04-14 – 2012-04-16 (×3): 1 via ORAL
  Filled 2012-04-14 (×3): qty 1

## 2012-04-14 MED ORDER — ACETAMINOPHEN 650 MG RE SUPP: 650.0000 mg | Freq: Four times a day (QID) | RECTAL | Status: DC | PRN

## 2012-04-14 MED ORDER — DONEPEZIL HCL 23 MG PO TABS
23.0000 mg | ORAL_TABLET | Freq: Every day | ORAL | Status: DC
  Administered 2012-04-14 – 2012-04-15 (×2): 23 mg via ORAL
  Filled 2012-04-14 (×3): qty 1

## 2012-04-14 MED ORDER — GABAPENTIN 800 MG PO TABS
800.0000 mg | ORAL_TABLET | Freq: Three times a day (TID) | ORAL | Status: DC
  Administered 2012-04-14 – 2012-04-15 (×5): 800 mg via ORAL
  Filled 2012-04-14 (×6): qty 1

## 2012-04-14 MED ORDER — ATENOLOL 100 MG PO TABS
100.0000 mg | ORAL_TABLET | Freq: Every day | ORAL | Status: DC
  Administered 2012-04-14 – 2012-04-16 (×3): 100 mg via ORAL
  Filled 2012-04-14 (×3): qty 1

## 2012-04-14 MED ORDER — AMLODIPINE BESYLATE 10 MG PO TABS
10.0000 mg | ORAL_TABLET | Freq: Every day | ORAL | Status: DC
  Filled 2012-04-14: qty 1

## 2012-04-14 MED ORDER — SODIUM CHLORIDE 0.9 % IV SOLN
INTRAVENOUS | Status: AC
  Administered 2012-04-14: 20 mL/h via INTRAVENOUS

## 2012-04-14 MED ORDER — ASPIRIN 81 MG PO CHEW
81.0000 mg | CHEWABLE_TABLET | Freq: Every day | ORAL | Status: DC
  Administered 2012-04-14 – 2012-04-16 (×3): 81 mg via ORAL
  Filled 2012-04-14 (×3): qty 1

## 2012-04-14 MED ORDER — LOSARTAN POTASSIUM 50 MG PO TABS
50.0000 mg | ORAL_TABLET | Freq: Every day | ORAL | Status: DC
  Filled 2012-04-14: qty 1

## 2012-04-14 MED ORDER — DARIFENACIN HYDROBROMIDE ER 7.5 MG PO TB24
7.5000 mg | ORAL_TABLET | Freq: Every day | ORAL | Status: DC
  Administered 2012-04-14 – 2012-04-16 (×3): 7.5 mg via ORAL
  Filled 2012-04-14 (×3): qty 1

## 2012-04-14 MED ORDER — ONDANSETRON HCL 4 MG/2ML IJ SOLN: 4.0000 mg | Freq: Four times a day (QID) | INTRAMUSCULAR | Status: DC | PRN

## 2012-04-14 MED ORDER — ZOLPIDEM TARTRATE 5 MG PO TABS
5.0000 mg | ORAL_TABLET | Freq: Every evening | ORAL | Status: DC | PRN
  Administered 2012-04-14: 5 mg via ORAL
  Filled 2012-04-14: qty 1

## 2012-04-14 MED ORDER — ZOLPIDEM TARTRATE 10 MG PO TABS: 10.0000 mg | ORAL_TABLET | Freq: Every evening | ORAL | Status: DC | PRN

## 2012-04-14 MED ORDER — HYDROCODONE-ACETAMINOPHEN 5-325 MG PO TABS
1.0000 | ORAL_TABLET | ORAL | Status: DC | PRN
  Administered 2012-04-14: 2 via ORAL
  Filled 2012-04-14: qty 2

## 2012-04-14 MED ORDER — ONDANSETRON HCL 4 MG PO TABS
4.0000 mg | ORAL_TABLET | Freq: Four times a day (QID) | ORAL | Status: DC | PRN
  Filled 2012-04-14: qty 0.5

## 2012-04-14 NOTE — ED Provider Notes (Signed)
Medical screening examination/treatment/procedure(s) were conducted as a shared visit with non-physician practitioner(s) and myself.  I personally evaluated the patient during the encounter  [name], MD 04/14/12 1506

## 2012-04-14 NOTE — Progress Notes (Signed)
[facility] PROGRESS NOTE  CAISLEY BAXENDALE ZOX:[id number] DOB: 04/10/1929 DOA: 04/13/2012 PCP: Sheila Oats, MD  Assessment/Plan: Active Problems:  HYPERTENSION  Hyperlipidemia  Pubic bone fracture  1. Right superior and inferior pubic rami fracture sustained after mechanical fall: Discussed with orthopedic M.D. Sallye Lat Salley Slaughter, MD who look that patient's imaging studies and recommended weightbearing as tolerated, pain controlled and outpatient followup with him on discharge. Plan was discussed with patient and family and they are agreeable. We'll get PT OT to evaluate. 2. Hypertension: Resume home meds. Controlled. 3. Hyperlipidemia: Continue home meds 4. Hypokalemia: By mouth repleted last night. Follow BMP in am. 5. History of schwannoma and right foot drop and peripheral neuropathy.  Code Status: Full Family Communication: Discussed with patient, her spouse and her daughter Dr. Misty Stanley Hand at the bedside, updated care and answered questions. Disposition Plan: Home   Brief narrative: 78 year-old female with history of hypertension hyperlipidemia and history of schwannoma status post surgery with chronic right lower extremity weakness had a fall at her house today after tripping. Patient denies hitting her head or losing consciousness. Patient was brought to the ER and x-rays reveal acute fracture of the right superior and inferior pubic rami  Consultants:  Orthopedics: Dr. Salvatore Marvel  Procedures:  None  Antibiotics:  None  HPI/Subjective: Right hip/groin pain which is minimal when she lies still but worse with movement or attempting to sit or stand. This seems better than last night. Unable to sleep last night because she received half the dose of Ambien that she usually takes.  Objective: Filed Vitals:   04/14/12 0600 04/14/12 0649 04/14/12 1400 04/14/12 1515  BP: 117/73  142/75   Pulse: 74  72 71  Temp: 98.7 F (37.1 C)  98.6 F (37 C)   TempSrc:        Resp: 16  16   Height:  5\' 8"  (1.727 m)    Weight:  83.915 kg (185 lb)    SpO2: 95%  96% 97%    Intake/Output Summary (Last 24 hours) at 04/14/12 1755 Last data filed at 04/14/12 1704  Gross per 24 hour  Intake    480 ml  Output      1 ml  Net    479 ml   Filed Weights   04/14/12 0649  Weight: 83.915 kg (185 lb)    Exam:   General exam: Moderately built and nourished female, lying comfortably supine in bed and is in no obvious distress.  Respiratory system: Clear to auscultation. No increased work of breathing.  Cardiovascular system: S1 and S2 heard, regular rate and rhythm. No JVD, murmurs or pedal edema.  Gastrointestinal system: Abdomen is nondistended, soft and nontender. Normal bowel sounds heard.  Central nervous system: Alert and oriented. No focal neurological deficits.  Extremities: Chronic right foot drop. Right lower extremity movement is limited secondary to right hip/groin pain. Good peripheral pulses felt. Other limbs are grade 5 x 5 power.  Data Reviewed: Basic Metabolic Panel:  Lab 04/13/12 8119  NA 140  K 3.3*  CL 101  CO2 25  GLUCOSE 112*  BUN 20  CREATININE 0.70  CALCIUM 9.4  MG --  PHOS --   Liver Function Tests: No results found for this basename: AST:5,ALT:5,ALKPHOS:5,BILITOT:5,PROT:5,ALBUMIN:5 in the last 168 hours No results found for this basename: LIPASE:5,AMYLASE:5 in the last 168 hours No results found for this basename: AMMONIA:5 in the last 168 hours CBC:  Lab 04/13/12 2318  WBC 9.9  NEUTROABS --  HGB 15.4*  HCT 44.9  MCV 91.4  PLT 198   Cardiac Enzymes: No results found for this basename: CKTOTAL:5,CKMB:5,CKMBINDEX:5,TROPONINI:5 in the last 168 hours BNP (last 3 results) No results found for this basename: PROBNP:3 in the last 8760 hours CBG: No results found for this basename: GLUCAP:5 in the last 168 hours  No results found for this or any previous visit (from the past 240 hour(s)).   Studies: Dg Hip Complete  Right  April 14, 2012  *RADIOLOGY REPORT*  Clinical Data: Fall.  Right hip pain.  RIGHT HIP - COMPLETE 2+ VIEW  Comparison: With  Findings: The patient has acute or right superior and inferior pubic rami fractures.  No other fracture is identified.  Left worse than right hip osteoarthritis is seen.  Postoperative change lower lumbar spine and right pelvis noted.  IMPRESSION:  1.  Acute right superior and inferior pubic rami fractures. 2.  Left worse than right hip osteoarthritis.   Original Report Authenticated By: Bernadene Bell. D'ALESSIO, M.D.     Scheduled Meds:    . sodium chloride   Intravenous STAT  . acidophilus  1 capsule Oral Daily  . amLODipine  10 mg Oral Daily  . aspirin  81 mg Oral Daily  . atenolol  100 mg Oral Daily  . cholecalciferol  5,000 Units Oral Daily  . darifenacin  7.5 mg Oral Daily  . desmopressin  0.2 mg Oral Daily  . docusate sodium  100 mg Oral BID  . donepezil  23 mg Oral QHS  . gabapentin  800 mg Oral TID  . HYDROcodone-acetaminophen  1 tablet Oral Once  . influenza  inactive virus vaccine  0.5 mL Intramuscular Tomorrow-1000  . losartan  50 mg Oral Daily  . mirabegron ER  25 mg Oral Daily  . multivitamin with minerals  1 tablet Oral Daily  . potassium chloride  40 mEq Oral Once  . sertraline  50 mg Oral Daily  . simvastatin  10 mg Oral QHS  . zolpidem  10 mg Oral QHS   Continuous Infusions:    . sodium chloride         Copelyn Widmer  [facility] Pager [phone]. If 8PM-8AM, please contact night-coverage at [email or web address], password [facility] 04/14/2012, 5:55 PM  LOS: 1 day

## 2012-04-14 NOTE — H&P (Signed)
Kristie [name] is an 78 y.o. female.  Patient was seen and examined on April 14, 2012 at 12:30 AM. PCP - Dr. Shary Decamp. Orthopedic - Dr. Sherry Ruffing with [facility].  Chief Complaint: Fall with right hip pain. HPI: 78 year-old female with history of hypertension hyperlipidemia and history of schwannoma status post surgery with chronic right lower extremity weakness had a fall at her house today after tripping. Patient denies hitting her head or losing consciousness. Patient was brought to the ER and x-rays reveal acute fracture of the right superior and inferior pubic rami. Patient has been admitted for further management as patient has severe pain on moving. Patient otherwise denies any chest pain or shortness of breath nausea vomiting or abdominal pain or diarrhea. Patient's pain is controlled with Vicodin tablet.  Past Medical History  Diagnosis Date  . Hypertension   . Hyperlipidemia     Past Surgical History  Procedure Date  . Appendectomy   . Abdominal hysterectomy   . Tonsillectomy   . Knee surgery     Family History  Problem Relation Age of Onset  . Breast cancer Mother   . Dementia Father    Social History:  reports that she has quit smoking. She does not have any smokeless tobacco history on file. She reports that she does not drink alcohol. Her drug history not on file.  Allergies: No Known Allergies   (Not in a hospital admission)  Results for orders placed during the hospital encounter of 04/13/12 (from the past 48 hour(s))  CBC     Status: Abnormal   Collection Time   04/13/12 11:18 PM      Component Value Range Comment   WBC 9.9  4.0 - 10.5 K/uL    RBC 4.91  3.87 - 5.11 MIL/uL    Hemoglobin 15.4 (*) 12.0 - 15.0 g/dL    HCT 40.9  81.1 - 91.4 %    MCV 91.4  78.0 - 100.0 fL    MCH 31.4  26.0 - 34.0 pg    MCHC 34.3  30.0 - 36.0 g/dL    RDW 78.2  95.6 - 21.3 %    Platelets 198  150 - 400 K/uL   BASIC METABOLIC PANEL     Status:  Abnormal   Collection Time   04/13/12 11:18 PM      Component Value Range Comment   Sodium 140  135 - 145 mEq/L    Potassium 3.3 (*) 3.5 - 5.1 mEq/L    Chloride 101  96 - 112 mEq/L    CO2 25  19 - 32 mEq/L    Glucose, Bld 112 (*) 70 - 99 mg/dL    BUN 20  6 - 23 mg/dL    Creatinine, Ser 0.86  0.50 - 1.10 mg/dL    Calcium 9.4  8.4 - 57.8 mg/dL    GFR calc non Af Amer 79 (*) >90 mL/min    GFR calc Af Amer >90  >90 mL/min    Dg Hip Complete Right  04/13/2012  *RADIOLOGY REPORT*  Clinical Data: Fall.  Right hip pain.  RIGHT HIP - COMPLETE 2+ VIEW  Comparison: With  Findings: The patient has acute or right superior and inferior pubic rami fractures.  No other fracture is identified.  Left worse than right hip osteoarthritis is seen.  Postoperative change lower lumbar spine and right pelvis noted.  IMPRESSION:  1.  Acute right superior and inferior pubic rami fractures. 2.  Left worse than right hip osteoarthritis.   Original Report Authenticated By: Bernadene Bell. Maricela Curet, M.D.     Review of Systems  Constitutional: Negative.   HENT: Negative.   Eyes: Negative.   Respiratory: Negative.   Cardiovascular: Negative.   Gastrointestinal: Negative.   Genitourinary: Negative.   Musculoskeletal:       Fall with right hip pain.  Skin: Negative.   Neurological: Negative.   Endo/Heme/Allergies: Negative.     Blood pressure 128/73, pulse 83, temperature 97.9 F (36.6 C), temperature source Oral, resp. rate 16, SpO2 96.00%. Physical Exam  Constitutional: She is oriented to person, place, and time. She appears well-developed and well-nourished. No distress.  HENT:  Head: Normocephalic and atraumatic.  Right Ear: External ear normal.  Left Ear: External ear normal.  Nose: Nose normal.  Mouth/Throat: Oropharynx is clear and moist. No oropharyngeal exudate.  Eyes: Conjunctivae normal are normal. Pupils are equal, round, and reactive to light.  Neck: Normal range of motion. Neck supple.    Cardiovascular: Normal rate and regular rhythm.   Respiratory: Effort normal and breath sounds normal. No respiratory distress. She has no wheezes.  GI: Soft. Bowel sounds are normal. She exhibits no distension. There is no tenderness. There is no rebound.  Musculoskeletal:       Pain on moving right lower extremity.  Neurological: She is alert and oriented to person, place, and time.       Has chronic right lower extremity weakness from schwannoma   Skin: Skin is warm and dry. She is not diaphoretic.     Assessment/Plan #1. Right-sided superior and inferior pubic rami fracture after mechanical fall - continue with Vicodin when necessary for pain relief. Consult physical therapy. Patient's orthopedic Dr. Dr. Sherry Ruffing may be consulted in a.m. for further opinion. #2. Hypertension - continue present medications. #3. Hyperlipidemia - continue present medications. #4. History of schwannoma with chronic right lower extremity weakness.  Patient is originally scheduled to move to independent living facility[facility] within the next few days when patient had this fall.  CODE STATUS - full code.  Maribelle Hopple N. 04/14/2012, 12:53 AM

## 2012-04-14 NOTE — Evaluation (Signed)
Physical Therapy Evaluation Patient Details Name: Kristie [name] MRN: [id number] DOB: 01-30-1929 Today's Date: 04/14/2012 Time: 2956-2130 PT Time Calculation (min): 46 min  PT Assessment / Plan / Recommendation Clinical Impression  Pt is an 78 y.o. female s/p double pubic rami fx.  Patient reports significant pain with mobility.  Patient demonstrates deficits in functional mobility at this time patient is unable to transfer or ambulate without assist secondary to pain, weakness, sensory deficits, and prior co morbidities.  Patients hx of falls and current fx's present concerns for patients safety with ambulation and stair negotiation. At this time feel patient will require w/c for mobilty and will not ba able to navigate stairs up to bedroom.  Patient will need arrangements for bed on 1st floor in order to ensure safety and function.  Pt will benefit from continued skilled physical therapy to address deficits stated in functional mobility at this time. Rec continued home PT at discharge. Will continue to see acutely to work on transfers and activity tolerance to minimize burden of care in preparation for discharge.    PT Assessment  Patient needs continued PT services    Follow Up Recommendations  Home health PT;Supervision/Assistance - 24 hour    Barriers to Discharge Inaccessible home environment Stairs to enter home, bed on 2nd story    Equipment Recommendations  Hospital bed;Wheelchair (measurements);Other (comment) (Pt cannot get up to second floor bedrooom at currrent level.)    Recommendations for Other Services     Frequency Min 4X/week    Precautions / Restrictions Precautions Precautions: Fall Required Braces or Orthoses: Other Brace/Splint Other Brace/Splint: Pt has AFO for right foot drop Restrictions Weight Bearing Restrictions: No Other Position/Activity Restrictions: WBAT   Pertinent Vitals/Pain 9/10 with movement and WBing      Mobility  Bed Mobility Bed Mobility:  Supine to Sit;Sit to Supine Supine to Sit: HOB elevated;1: +2 Total assist Supine to Sit: Patient Percentage: 60% Sitting - Scoot to Edge of Bed: 1: +2 Total assist;3: Mod assist Sitting - Scoot to Edge of Bed: Patient Percentage: 60% Sit to Supine: HOB flat;1: +2 Total assist Sit to Supine: Patient Percentage: 60% Details for Bed Mobility Assistance: Assist for hip rotation, trunk support and RLE movement Transfers Transfers: Sit to Stand;Stand to Sit Sit to Stand: 1: +2 Total assist;From bed;With upper extremity assist Sit to Stand: Patient Percentage: 60% Stand to Sit: 1: +2 Total assist;4: Min assist;To bed Details for Transfer Assistance: Patient required assist to stand and VCs for hand placement  Ambulation/Gait Ambulation/Gait Assistance: 1: +2 Total assist;4: Min assist Ambulation/Gait: Patient Percentage: 80% Ambulation Distance (Feet): 4 Feet Assistive device: Rolling walker Ambulation/Gait Assistance Details: Assist for advancement of RLE and UE support with bilateral guarding Gait Pattern: Step-to pattern;Decreased stride length;Decreased stance time - right;Right foot flat;Shuffle;Antalgic;Narrow base of support General Gait Details: Patient required max encouragement to bear weight through RLE.  Patient has difficulty with ambulation and is unable to advance RLE without assist. Stairs: No    Exercises     PT Diagnosis: Difficulty walking;Abnormality of gait;Generalized weakness;Acute pain  PT Problem List: Decreased strength;Decreased range of motion;Decreased activity tolerance;Decreased balance;Decreased mobility;Pain PT Treatment Interventions: DME instruction;Gait training;Functional mobility training;Therapeutic activities;Therapeutic exercise;Wheelchair mobility training   PT Goals Acute Rehab PT Goals PT Goal Formulation: With patient/family Time For Goal Achievement: 04/18/12 Potential to Achieve Goals: Good Pt will Transfer Bed to Chair/Chair to Bed: with  min assist PT Transfer Goal: Bed to Chair/Chair to Bed - Progress: Goal set today  Visit Information  Last PT Received On: 04/14/12 Assistance Needed: +2    Subjective Data  Subjective: i'm in a lot of pain Patient Stated Goal: to go home   Prior Functioning  Home Living Lives With: Spouse Available Help at Discharge: Family;Available 24 hours/day (Spouse ) Type of Home: House Home Access: Stairs to enter [employer] of Steps: 2 Entrance Stairs-Rails: None Home Layout: Able to live on main level with bedroom/bathroom Bathroom Shower/Tub: Health visitor: Standard Home Adaptive Equipment: Bedside commode/3-in-1;Walker - rolling;Straight cane Prior Function Level of Independence: Independent with assistive device(s) Able to Take Stairs?: Yes Driving: Yes Vocation: Retired Dominant Hand: Right    Cognition  Overall Cognitive Status: Appears within functional limits for tasks assessed/performed Arousal/Alertness: Awake/alert Orientation Level: Appears intact for tasks assessed;Oriented X4 / Intact Behavior During Session: [facility] for tasks performed    Extremity/Trunk Assessment Right Upper Extremity Assessment RUE ROM/Strength/Tone: Within functional levels RUE Sensation: History of peripheral neuropathy RUE Coordination: WFL - gross/fine motor Left Upper Extremity Assessment LUE ROM/Strength/Tone: Within functional levels LUE Sensation: History of peripheral neuropathy LUE Coordination: WFL - gross/fine motor Right Lower Extremity Assessment RLE ROM/Strength/Tone: Deficits;Unable to fully assess;Due to pain RLE Sensation: Deficits RLE Sensation Deficits: proprioceptive deficits and right foot paralysis RLE Coordination: Deficits RLE Coordination Deficits: drop foot RLE Left Lower Extremity Assessment LLE ROM/Strength/Tone: [facility] for tasks assessed;Deficits;Due to pain Trunk Assessment Trunk Assessment: Normal   Balance Balance Balance Assessed:  Yes Static Sitting Balance Static Sitting - Balance Support: Right upper extremity supported;Left upper extremity supported Static Sitting - Level of Assistance: 5: Stand by assistance Static Standing Balance Static Standing - Balance Support: Right upper extremity supported;Left upper extremity supported Static Standing - Level of Assistance: 3: Mod assist  End of Session PT - End of Session Equipment Utilized During Treatment: Gait belt Activity Tolerance: Patient limited by pain Patient left: in bed;with call bell/phone within reach;with family/visitor present Nurse Communication: Mobility status  GP     Fabio Asa 04/14/2012, 4:14 PM Charlotte Crumb, PT DPT  [phone]

## 2012-04-14 NOTE — Evaluation (Signed)
Occupational Therapy Evaluation Patient Details Name: Kristie [name] MRN: [id number] DOB: [birth year] Today's Date: 04/14/2012 Time: 9147-8295 OT Time Calculation (min): 48 min  OT Assessment / Plan / Recommendation Clinical Impression  Pleasant 78 yr old female admitted after fall resulting in pelvic fracture.  Currently reports significant pain in her right pelvis resutling in moderate impairments with selfcare and mobility.  Feel she will benefit from acute OT to help increase overall activity tolerance and independence with basic selfcare tasks and functional transfers.  Feel based on current need of assistance for bed mobility, transfers and selfcare that pt will need 24 hour assist at discharge.  If discharge home recommend HHOT to continue working torward modified independent level. .  If pt progresses slowly may need short term SNF for rehab prior to home as well.    OT Assessment  Patient needs continued OT Services    Follow Up Recommendations  Home health OT    Barriers to Discharge Inaccessible home environment;Decreased caregiver support Pt may need greater assist than husband can perform.  Equipment Recommendations  Hospital bed;Wheelchair (measurements);Other (comment) (Pt cannot get up to second floor bedrooom at currrent level.)       Frequency  Min 2X/week    Precautions / Restrictions Precautions Precautions: Fall Required Braces or Orthoses: Other Brace/Splint Other Brace/Splint: Pt has AFO for right foot drop Restrictions Weight Bearing Restrictions: No Other Position/Activity Restrictions: WBAT   Pertinent Vitals/Pain Pain 4/10 at rest in the right pelvis, increasing to 9/10 with transitions    ADL  Eating/Feeding: Simulated;Independent Where Assessed - Eating/Feeding: Bed level Grooming: Simulated;Set up Where Assessed - Grooming: Unsupported sitting Upper Body Bathing: Simulated;Set up Where Assessed - Upper Body Bathing: Unsupported sitting Lower Body  Bathing: Simulated;+2 Total assistance Lower Body Bathing: Patient Percentage: 40% Where Assessed - Lower Body Bathing: Supported sit to stand Upper Body Dressing: Simulated;Set up Where Assessed - Upper Body Dressing: Unsupported sitting Lower Body Dressing: Performed;+2 Total assistance Lower Body Dressing: Patient Percentage: 40% Where Assessed - Lower Body Dressing: Supported sit to stand Toilet Transfer: Simulated;+2 Total assistance Toilet Transfer: Patient Percentage: 60% Toilet Transfer Method: Surveyor, minerals: Other (comment) (At EOB pt declined need to toilet.) Toileting - Clothing Manipulation and Hygiene: Simulated;+2 Total assistance Toileting - Clothing Manipulation and Hygiene: Patient Percentage: 40% Where Assessed - Glass blower/designer Manipulation and Hygiene: Other (comment) (sit to stand at EOB) Tub/Shower Transfer Method: Not assessed Equipment Used: Rolling walker;Gait belt Transfers/Ambulation Related to ADLs: Pp currently needs total +2 (pt 40%) for sit to stand and taking a few steps forward and backwards at the edge of the bed with the RW. ADL Comments: Pt with increased pain limiting all mobility including transitions from supine to sitting.  Pt needed HOB elevated and 2 therapists assisting to tansition to EOB, perform sit to stand, and to take a few steps.  Exhibits limited flexibility for LB selfcare and will likely need AE for dressing tasks.    OT Diagnosis: Generalized weakness;Acute pain  OT Problem List: Decreased strength;Decreased activity tolerance;Decreased knowledge of use of DME or AE;Impaired balance (sitting and/or standing);Pain OT Treatment Interventions: Self-care/ADL training;DME and/or AE instruction;Balance training;Patient/family education;Therapeutic activities   OT Goals Acute Rehab OT Goals OT Goal Formulation: With patient ADL Goals Pt Will Perform Lower Body Bathing: with min assist;Sit to stand from bed;with  adaptive equipment ADL Goal: Lower Body Bathing - Progress: Goal set today Pt Will Perform Lower Body Dressing: with min assist;Sit to stand from bed;with  adaptive equipment ADL Goal: Lower Body Dressing - Progress: Goal set today Pt Will Transfer to Toilet: 3-in-1;with DME;with min assist;Stand pivot transfer ADL Goal: Toilet Transfer - Progress: Goal set today Pt Will Perform Toileting - Clothing Manipulation: with min assist;Sitting on 3-in-1 or toilet;Standing ADL Goal: Toileting - Clothing Manipulation - Progress: Goal set today Pt Will Perform Toileting - Hygiene: Sit to stand from 3-in-1/toilet;with min assist ADL Goal: Toileting - Hygiene - Progress: Goal set today  Visit Information  Last OT Received On: 04/14/12 Assistance Needed: +2    Subjective Data  Subjective: "I usually handle pain pretty well." Patient Stated Goal: Pt wants to go home if possilbe but is open to short term rehab.   Prior Functioning  Vision/Perception  Home Living Lives With: Spouse Type of Home: House Home Access: Stairs to enter Secretary/administrator of Steps: 2 Entrance Stairs-Rails: None Home Layout: Able to live on main level with bedroom/bathroom Bathroom Shower/Tub: Health visitor: Standard Home Adaptive Equipment: Bedside commode/3-in-1;Walker - rolling;Straight cane Prior Function Level of Independence: Independent with assistive device(s) Able to Take Stairs?: Yes Driving: Yes Dominant Hand: Right   Vision - Assessment Eye Alignment: Within Functional Limits Vision Assessment: Vision not tested Perception Perception: Within Functional Limits Praxis Praxis: Intact  Cognition  Overall Cognitive Status: Appears within functional limits for tasks assessed/performed Arousal/Alertness: Awake/alert Orientation Level: Appears intact for tasks assessed;Oriented X4 / Intact Behavior During Session: [facility] for tasks performed    Extremity/Trunk Assessment Right Upper  Extremity Assessment RUE ROM/Strength/Tone: Within functional levels RUE Sensation: History of peripheral neuropathy RUE Coordination: WFL - gross/fine motor Left Upper Extremity Assessment LUE ROM/Strength/Tone: Within functional levels LUE Sensation: History of peripheral neuropathy LUE Coordination: WFL - gross/fine motor Trunk Assessment Trunk Assessment: Normal   Mobility  Shoulder Instructions  Bed Mobility Bed Mobility: Supine to Sit;Sit to Supine Supine to Sit: HOB elevated;1: +2 Total assist Supine to Sit: Patient Percentage: 60% Sit to Supine: HOB flat;1: +2 Total assist Sit to Supine: Patient Percentage: 60% Transfers Transfers: Sit to Stand Sit to Stand: 1: +2 Total assist;From bed;With upper extremity assist Sit to Stand: Patient Percentage: 60%          Balance Balance Balance Assessed: Yes Static Sitting Balance Static Sitting - Balance Support: Right upper extremity supported;Left upper extremity supported Static Sitting - Level of Assistance: 5: Stand by assistance Static Standing Balance Static Standing - Balance Support: Right upper extremity supported;Left upper extremity supported Static Standing - Level of Assistance: 3: Mod assist   End of Session OT - End of Session Equipment Utilized During Treatment: Gait belt Activity Tolerance: Patient limited by pain;Patient limited by fatigue Patient left: in bed;with call bell/phone within reach;with family/visitor present Nurse Communication: Mobility status  GO Functional Assessment Tool Used: clinical judgement Functional Limitation: Self care Self Care Current Status (W0981): At least 80 percent but less than 100 percent impaired, limited or restricted Self Care Goal Status (X9147): At least 1 percent but less than 20 percent impaired, limited or restricted   Angad Nabers OTR/L Pager number [phone] 04/14/2012, 4:13 PM

## 2012-04-15 DIAGNOSIS — R110 Nausea: Secondary | ICD-10-CM | POA: Diagnosis not present

## 2012-04-15 DIAGNOSIS — E876 Hypokalemia: Secondary | ICD-10-CM

## 2012-04-15 LAB — BASIC METABOLIC PANEL
CO2: 26 mEq/L (ref 19–32)
Calcium: 9.2 mg/dL (ref 8.4–10.5)
Creatinine, Ser: 0.5 mg/dL (ref 0.50–1.10)
GFR calc non Af Amer: 88 mL/min — ABNORMAL LOW (ref >90)
Glucose, Bld: 151 mg/dL — ABNORMAL HIGH (ref 70–99)

## 2012-04-15 LAB — URINALYSIS, ROUTINE W REFLEX MICROSCOPIC
Glucose, UA: 100 mg/dL — AB
Hgb urine dipstick: NEGATIVE
Ketones, ur: NEGATIVE mg/dL
Protein, ur: NEGATIVE mg/dL

## 2012-04-15 MED ORDER — BISACODYL 10 MG RE SUPP
10.0000 mg | Freq: Every day | RECTAL | Status: DC | PRN
  Administered 2012-04-15: 10 mg via RECTAL
  Filled 2012-04-15: qty 1

## 2012-04-15 MED ORDER — GABAPENTIN 400 MG PO CAPS
800.0000 mg | ORAL_CAPSULE | Freq: Three times a day (TID) | ORAL | Status: DC
  Administered 2012-04-15 – 2012-04-16 (×2): 800 mg via ORAL
  Filled 2012-04-15 (×4): qty 2

## 2012-04-15 NOTE — Progress Notes (Signed)
   CARE MANAGEMENT NOTE 04/15/2012  Patient:  Kristie [name], Kristie [name]   Account Number:  [id number]  Date Initiated:  04/14/2012  Documentation initiated by:  Ronny Flurry  Subjective/Objective Assessment:   Mechanial fall at home    acute fracture of the right superior and inferior pubic rami     Action/Plan:   lives at home with husband, Kristie [name]   Anticipated DC Date:  04/15/2012   Anticipated DC Plan:  HOME W HOME HEALTH SERVICES      DC Planning Services  CM consult      [facility] Choice  HOME HEALTH   Choice offered to / List presented to:  C-1 Patient   DME arranged  HOSPITAL BED  WHEELCHAIR - MANUAL  OTHER - SEE COMMENT      DME agency  [facility]     HH arranged  HH-2 PT  HH-3 OT      [facility] agency  [facility]   Status of service:  In process, will continue to follow Medicare Important Message given?   (If response is "NO", the following Medicare IM given date fields will be blank) Date Medicare IM given:   Date Additional Medicare IM given:    Discharge Disposition:    Per UR Regulation:    If discussed at Long Length of Stay Meetings, dates discussed:    Comments:  04/15/2012 1100 NCM spoke to pt and husband. States she prefers [place] for [facility]. States she needs wheelchair and hospital bed for home. PT recommended DME for home. Orders and required Medicare documentation to qualify for DME faxed to [facility]. Spoke to Kristie [name], rep with [employer]. They will contact pt or family member to schedule a delivery time for DME. NCM made aware pt scheduled for d/c home on 04/16/2012. Will provided list of private duty agencies to family. Will leave note for MD. Pt is requesting an HH aide to assist with bathing. Isidoro Donning RN CCM Case Mgmt phone [phone]   Patient is originally scheduled to move to independent living facility[facility] within the next few days when patient had this fall.

## 2012-04-15 NOTE — Progress Notes (Signed)
PT PROGRESS NOTE   04/15/12 1100  PT Visit Information  Last PT Received On 04/15/12  Assistance Needed +1  PT Time Calculation  PT Start Time 1146  PT Stop Time 1229  PT Time Calculation (min) 43 min  Subjective Data  Subjective I am discouraged about how poorly I did yesterday  Patient Stated Goal To go home.   Precautions  Precautions Fall  Required Braces or Orthoses Other Brace/Splint  Other Brace/Splint Pt has AFO for right foot drop  Restrictions  Weight Bearing Restrictions No  Other Position/Activity Restrictions WBAT  Cognition  Overall Cognitive Status Appears within functional limits for tasks assessed/performed  Arousal/Alertness Awake/alert  Orientation Level Appears intact for tasks assessed;Oriented X4 / Intact  Behavior During Session [facility] for tasks performed  Bed Mobility  Bed Mobility Supine to Sit;Sitting - Scoot to Edge of Bed  Supine to Sit 4: Min assist;HOB flat  Supine to Sit: Patient Percentage 90%  Sitting - Scoot to Edge of Bed 4: Min assist  Sitting - Scoot to Carmen of Bed: Patient Percentage 90%  Details for Bed Mobility Assistance Step by step cues for proper technique. Instructed pt to use leg lifter.  Min assist for R LE.   Transfers  Transfers Stand Pivot Transfers;Sit to Stand;Stand to Sit  Sit to Stand 4: Min assist;From bed;With upper extremity assist  Sit to Stand: Patient Percentage 80%  Stand to Sit 3: Mod assist;To chair/3-in-1;With upper extremity assist  Stand Pivot Transfers 4: Min assist  Details for Transfer Assistance Cueing for technique, assist to initiate standing and control descent to chair. Cues for hand placement and R LE positioning to minimize pain.   Ambulation/Gait  Ambulation/Gait Assistance 3: Mod assist  Ambulation/Gait: Patient Percentage 80%  Ambulation Distance (Feet) 8 Feet  Assistive device Rolling walker  Ambulation/Gait Assistance Details Cues for gait sequencing and use of RW.  Pt limited by pain in R hip.     Gait Pattern Step-to pattern;Decreased stride length;Decreased stance time - right;Right foot flat;Shuffle;Antalgic;Narrow base of support  General Gait Details Pt required less encouragement to bear weight on R LE and able to advance bilateral LE without assistance.   Stairs No  Balance  Balance Assessed No  PT - End of Session  Equipment Utilized During Treatment Gait belt  Activity Tolerance Patient tolerated treatment well  Patient left in chair;with call bell/phone within reach;with family/visitor present  Nurse Communication Mobility status  PT - Assessment/Plan  Comments on Treatment Session educated pt and family on proper bed mobility, transfer and gait techniques.  Pt pain increases with sudden movements of RLE.   Instructed family to provide assistance without causing pain.  Pt tolerated today's session very well and was encouraged about her ability to return to home.    PT Plan Discharge plan remains appropriate;Frequency remains appropriate  PT Frequency Min 4X/week  Follow Up Recommendations Home health PT;Supervision/Assistance - 24 hour  Equipment Recommended Hospital bed;Wheelchair (measurements);Other (comment)  Acute Rehab PT Goals  PT Goal Formulation With patient/family  Time For Goal Achievement 04/18/12  Potential to Achieve Goals Good  Pt will Transfer Bed to Chair/Chair to Bed with min assist  PT Transfer Goal: Bed to Chair/Chair to Bed - Progress Progressing toward goal  Pt will Ambulate 1 - 15 feet;with min assist;with rolling walker  PT Goal: Ambulate - Progress Goal set today  Pt will Go Up / Down Stairs 1-2 stairs;with mod assist;with rolling walker  PT Goal: Up/Down Stairs - Progress Goal  set today  PT General Charges  $$ ACUTE PT VISIT 1 Procedure  PT Treatments  $Gait Training 8-22 mins  $Therapeutic Activity 23-37 mins   Prestina Raigoza L. Tannis Burstein DPT [phone]

## 2012-04-15 NOTE — Progress Notes (Signed)
[facility] PROGRESS NOTE  Kristie [name] XBJ:[id number] DOB: 1929/06/19 DOA: 04/13/2012 PCP: Sheila Oats, MD  Assessment/Plan: Active Problems:  HYPERTENSION  Hyperlipidemia  Pubic bone fracture  Hypokalemia  1. Right superior and inferior pubic rami fracture sustained after mechanical fall: Discussed with orthopedic M.D. Sallye Lat Salley Slaughter, MD who looked at patient's imaging studies and recommended weightbearing as tolerated, pain control and outpatient followup with him on discharge. Plan was discussed with patient and family and they are agreeable. PT and OT have evaluated her and recommend home health PT and OT, wheelchair and hospital bed. Pain is slightly better. 2. Nausea:? Secondary to pain medications. No vomiting. Monitor. Diet as tolerated. UA unremarkable. 3. Hypertension: Resumed home meds. Controlled. 4. Hypokalemia: Repleted. 5. Hyperlipidemia: Continue home meds 6. History of schwannoma and right foot drop and peripheral neuropathy.  Code Status: Full Family Communication: Discussed with patient, her spouse and her daughter Dr. Misty Stanley Hand at the bedside (9/20 and 9/21), updated care and answered questions. Disposition Plan: Home on 9/22   Brief narrative: 78 year-old female with history of hypertension hyperlipidemia and history of schwannoma status post surgery with chronic right lower extremity weakness had a fall at her house today after tripping. Patient denies hitting her head or losing consciousness. Patient was brought to the ER and x-rays reveal acute fracture of the right superior and inferior pubic rami  Consultants:  Orthopedics: Dr. Salvatore Marvel  Procedures:  None  Antibiotics:  None  HPI/Subjective: Right hip pain is slightly better. Nausea since early this morning. No vomiting. Did not eat much at breakfast. Overall does not feel too well. Passing flatus. No BMs. No abdominal pain.  Objective: Filed Vitals:   04/14/12 1515 04/14/12  2021 04/15/12 0952 04/15/12 1336  BP:  133/69 150/76 135/62  Pulse: 71 69 74 65  Temp:  98 F (36.7 C) 97.7 F (36.5 C) 98.2 F (36.8 C)  TempSrc:  Oral Oral   Resp:  15 18 18   Height:      Weight:      SpO2: 97% 99% 96% 95%    Intake/Output Summary (Last 24 hours) at 04/15/12 1535 Last data filed at 04/14/12 1704  Gross per 24 hour  Intake    240 ml  Output      0 ml  Net    240 ml   Filed Weights   04/14/12 0649  Weight: 83.915 kg (185 lb)    Exam:   General exam: Moderately built and nourished female, lying comfortably supine in bed and is in no obvious distress. Pleasant and cheerful  Respiratory system: Clear to auscultation. No increased work of breathing.  Cardiovascular system: S1 and S2 heard, regular rate and rhythm. No JVD, murmurs or pedal edema.  Gastrointestinal system: Abdomen is nondistended, soft and nontender. Normal bowel sounds heard.  Central nervous system: Alert and oriented. No focal neurological deficits.  Extremities: Chronic right foot drop. Right lower extremity movement is limited secondary to right hip/groin pain. Good peripheral pulses felt. Other limbs are grade 5 x 5 power.  Data Reviewed: Basic Metabolic Panel:  Lab 04/15/12 3086 04/13/12 2318  NA 136 140  K 3.5 3.3*  CL 103 101  CO2 26 25  GLUCOSE 151* 112*  BUN 16 20  CREATININE 0.50 0.70  CALCIUM 9.2 9.4  MG -- --  PHOS -- --   Liver Function Tests: No results found for this basename: AST:5,ALT:5,ALKPHOS:5,BILITOT:5,PROT:5,ALBUMIN:5 in the last 168 hours No results found for this basename: LIPASE:5,AMYLASE:5  in the last 168 hours No results found for this basename: AMMONIA:5 in the last 168 hours CBC:  Lab 04/13/12 2318  WBC 9.9  NEUTROABS --  HGB 15.4*  HCT 44.9  MCV 91.4  PLT 198   Cardiac Enzymes: No results found for this basename: CKTOTAL:5,CKMB:5,CKMBINDEX:5,TROPONINI:5 in the last 168 hours BNP (last 3 results) No results found for this basename:  PROBNP:3 in the last 8760 hours CBG: No results found for this basename: GLUCAP:5 in the last 168 hours  No results found for this or any previous visit (from the past 240 hour(s)).   Studies: Dg Hip Complete Right  04/13/2012  *RADIOLOGY REPORT*  Clinical Data: Fall.  Right hip pain.  RIGHT HIP - COMPLETE 2+ VIEW  Comparison: With  Findings: The patient has acute or right superior and inferior pubic rami fractures.  No other fracture is identified.  Left worse than right hip osteoarthritis is seen.  Postoperative change lower lumbar spine and right pelvis noted.  IMPRESSION:  1.  Acute right superior and inferior pubic rami fractures. 2.  Left worse than right hip osteoarthritis.   Original Report Authenticated By: Bernadene Bell. D'ALESSIO, M.D.     Scheduled Meds:    . acidophilus  1 capsule Oral Daily  . amLODipine  10 mg Oral QHS  . aspirin  81 mg Oral Daily  . atenolol  100 mg Oral Daily  . cholecalciferol  5,000 Units Oral Daily  . darifenacin  7.5 mg Oral Daily  . desmopressin  0.2 mg Oral Daily  . docusate sodium  100 mg Oral BID  . donepezil  23 mg Oral QHS  . gabapentin  800 mg Oral TID  . influenza  inactive virus vaccine  0.5 mL Intramuscular Tomorrow-1000  . losartan  50 mg Oral QHS  . mirabegron ER  25 mg Oral Daily  . multivitamin with minerals  1 tablet Oral Daily  . sertraline  50 mg Oral Daily  . simvastatin  10 mg Oral QHS  . zolpidem  10 mg Oral QHS  . DISCONTD: amLODipine  10 mg Oral Daily  . DISCONTD: losartan  50 mg Oral Daily   Continuous Infusions:    . sodium chloride         Tkai Serfass  [facility] Pager [phone]. If 8PM-8AM, please contact night-coverage at [email or web address], password [facility] 04/15/2012, 3:35 PM  LOS: 2 days

## 2012-04-16 MED ORDER — TRAMADOL HCL 50 MG PO TABS: 50.0000 mg | ORAL_TABLET | Freq: Four times a day (QID) | ORAL | Status: DC | PRN

## 2012-04-16 MED ORDER — DSS 100 MG PO CAPS: 100.0000 mg | ORAL_CAPSULE | Freq: Two times a day (BID) | ORAL | Status: DC

## 2012-04-16 NOTE — Discharge Summary (Signed)
Physician Discharge Summary  Kristie [name] UJW:[id number] DOB: 1929-02-01 DOA: 04/13/2012  PCP: Shary Decamp, M.D.  Primary Neurologist: Dr. Avie Echevaria  Admit date: 04/13/2012 Discharge date: 04/16/2012  Recommendations for Outpatient Follow-up:  1. Followup with Dr. Salvatore Marvel, Orthopedics in one week from hospital discharge. Family is advised to call on Monday for an appointment.  Discharge Diagnoses:  Active Problems:  HYPERTENSION  Hyperlipidemia  Pubic bone fracture  Hypokalemia  Nausea   Discharge Condition: Improved and stable.  Diet recommendation: Heart healthy diet.  Filed Weights   04/14/12 0649  Weight: 83.915 kg (185 lb)    History of present illness:  78 year-old female with history of hypertension hyperlipidemia and history of schwannoma status post surgery with chronic right lower extremity weakness had a fall at her house today after tripping. Patient denies hitting her head or losing consciousness. Patient was brought to the ER and x-rays reveal acute fracture of the right superior and inferior pubic rami   Hospital Course:  1. Right superior and inferior pubic rami fracture sustained after mechanical fall: Discussed with orthopedic M.D. Sallye Lat Salley Slaughter, MD who looked at patient's imaging studies and recommended weightbearing as tolerated, pain control and outpatient followup with him on discharge. Plan was discussed with patient and family and they were agreeable. PT and OT have evaluated her and recommend home health PT and OT, wheelchair and hospital bed. Pain has improved. 2. Nausea:? Secondary to pain medications. Resolved. UA unremarkable. 3. Hypertension: Resumed home meds. Controlled. 4. Hypokalemia: Repleted. 5. Hyperlipidemia: Continue home meds 6. History of schwannoma and right foot drop and peripheral neuropathy. Patient follows with Dr. Avie Echevaria.  Procedures:  None  Consultations:  Phone consultation with Dr. Salvatore Marvel,  Orthopedics  Discharge Exam:  Complaints Patient indicates that her right hip pain is significantly better. No further nausea. Tolerated diet well. Had a BM. No other complaints.   Filed Vitals:   04/15/12 0952 04/15/12 1336 04/15/12 2007 04/16/12 0628  BP: 150/76 135/62 153/86 148/68  Pulse: 74 65 100 90  Temp: 97.7 F (36.5 C) 98.2 F (36.8 C) 98.5 F (36.9 C) 98.1 F (36.7 C)  TempSrc: Oral  Oral Oral  Resp: 18 18 18 16   Height:      Weight:      SpO2: 96% 95% 95% 97%    General exam: Moderately built and nourished female, lying comfortably supine in bed and is in no obvious distress. Pleasant and cheerful  Respiratory system: Clear to auscultation. No increased work of breathing.  Cardiovascular system: S1 and S2 heard, regular rate and rhythm. No JVD, murmurs or pedal edema.  Gastrointestinal system: Abdomen is nondistended, soft and nontender. Normal bowel sounds heard.  Central nervous system: Alert and oriented. No focal neurological deficits.  Extremities: Chronic right foot drop. Able to move her right lower extremity well with minimal pain. Good peripheral pulses felt. Other limbs are grade 5 x 5 power.  Discharge Instructions      Discharge Orders    Future Orders Please Complete By Expires   Diet - low sodium heart healthy      Discharge instructions      Comments:   Weight bearing as tolerated on Right Leg.   Increase activity slowly      Call MD for:  severe uncontrolled pain          Medication List     As of 04/16/2012 11:54 AM    STOP taking these medications  ibuprofen 200 MG tablet   Commonly known as: ADVIL,MOTRIN      TAKE these medications         acetaminophen 325 MG tablet   Commonly known as: TYLENOL   Take 325 mg by mouth every 6 (six) hours as needed. For pain      acidophilus Caps   Take 1 capsule by mouth daily.      amLODipine 10 MG tablet   Commonly known as: NORVASC   Take 10 mg by mouth daily.      ARICEPT 23  MG Tabs tablet   Generic drug: donepezil   Take 23 mg by mouth at bedtime.      aspirin 81 MG chewable tablet   Chew 81 mg by mouth daily.      atenolol 100 MG tablet   Commonly known as: TENORMIN   Take 100 mg by mouth daily.      cholecalciferol 1000 UNITS tablet   Commonly known as: VITAMIN D   Take 5,000 Units by mouth daily.      desmopressin 0.2 MG tablet   Commonly known as: DDAVP   Take 0.2 mg by mouth daily.      DSS 100 MG Caps   Take 100 mg by mouth 2 (two) times daily.      gabapentin 800 MG tablet   Commonly known as: NEURONTIN   Take 800 mg by mouth 3 (three) times daily.      losartan 50 MG tablet   Commonly known as: COZAAR   Take 50 mg by mouth daily.      meloxicam 15 MG tablet   Commonly known as: MOBIC   Take 7.5 mg by mouth daily.      multivitamin with minerals Tabs   Take 1 tablet by mouth daily.      MYRBETRIQ 25 MG Tb24   Generic drug: mirabegron ER   Take 25 mg by mouth daily.      sertraline 50 MG tablet   Commonly known as: ZOLOFT   Take 50 mg by mouth daily.      simvastatin 10 MG tablet   Commonly known as: ZOCOR   Take 10 mg by mouth at bedtime.      solifenacin 10 MG tablet   Commonly known as: VESICARE   Take by mouth daily.      traMADol 50 MG tablet   Commonly known as: ULTRAM   Take 1 tablet (50 mg total) by mouth every 6 (six) hours as needed for pain.      zolpidem 10 MG tablet   Commonly known as: AMBIEN   Take 10 mg by mouth at bedtime as needed. For sleep        Follow-up Information    Follow up with [facility]. (Home Health Physical Therapy and Occupational Therapy)    Contact information:   [phone]      Follow up with Nilda Simmer, MD. Schedule an appointment as soon as possible for a visit in 1 week.   Contact information:   [facility] [address] [place] [address] [phone]           The results of significant diagnostics from this  hospitalization (including imaging, microbiology, ancillary and laboratory) are listed below for reference.    Significant Diagnostic Studies: Dg Hip Complete Right  04/13/2012  *RADIOLOGY REPORT*  Clinical Data: Fall.  Right hip pain.  RIGHT HIP - COMPLETE 2+ VIEW  Comparison: With  Findings: The patient has acute or right superior and inferior pubic rami fractures.  No other fracture is identified.  Left worse than right hip osteoarthritis is seen.  Postoperative change lower lumbar spine and right pelvis noted.  IMPRESSION:  1.  Acute right superior and inferior pubic rami fractures. 2.  Left worse than right hip osteoarthritis.   Original Report Authenticated By: Bernadene Bell. Maricela Curet, M.D.     Microbiology: No results found for this or any previous visit (from the past 240 hour(s)).   Labs: Basic Metabolic Panel:  Lab 04/15/12 0960 04/13/12 2318  NA 136 140  K 3.5 3.3*  CL 103 101  CO2 26 25  GLUCOSE 151* 112*  BUN 16 20  CREATININE 0.50 0.70  CALCIUM 9.2 9.4  MG -- --  PHOS -- --   Liver Function Tests: No results found for this basename: AST:5,ALT:5,ALKPHOS:5,BILITOT:5,PROT:5,ALBUMIN:5 in the last 168 hours No results found for this basename: LIPASE:5,AMYLASE:5 in the last 168 hours No results found for this basename: AMMONIA:5 in the last 168 hours CBC:  Lab 04/13/12 2318  WBC 9.9  NEUTROABS --  HGB 15.4*  HCT 44.9  MCV 91.4  PLT 198   Cardiac Enzymes: No results found for this basename: CKTOTAL:5,CKMB:5,CKMBINDEX:5,TROPONINI:5 in the last 168 hours BNP: BNP (last 3 results) No results found for this basename: PROBNP:3 in the last 8760 hours CBG: No results found for this basename: GLUCAP:5 in the last 168 hours  Time coordinating discharge: less than 30 minutes  Signed:  HONGALGI,ANAND  [facility] 04/16/2012, 11:54 AM

## 2012-04-16 NOTE — Progress Notes (Signed)
   CARE MANAGEMENT NOTE 04/16/2012  Patient:  Kristie [name], Kristie [name]   Account Number:  [id number]  Date Initiated:  04/14/2012  Documentation initiated by:  Ronny Flurry  Subjective/Objective Assessment:   Mechanial fall at home    acute fracture of the right superior and inferior pubic rami     Action/Plan:   lives at home with husband, Milas Gain   Anticipated DC Date:  04/15/2012   Anticipated DC Plan:  HOME W HOME HEALTH SERVICES      DC Planning Services  CM consult      [facility] Choice  HOME HEALTH   Choice offered to / List presented to:  C-1 Patient   DME arranged  HOSPITAL BED  WHEELCHAIR - MANUAL  OTHER - SEE COMMENT      DME agency  [facility]     HH arranged  HH-2 PT  HH-3 OT  HH-4 NURSE'S AIDE      HH agency  [facility]   Status of service:  Completed, signed off Medicare Important Message given?   (If response is "NO", the following Medicare IM given date fields will be blank) Date Medicare IM given:   Date Additional Medicare IM given:    Discharge Disposition:  HOME W HOME HEALTH SERVICES  Per UR Regulation:    If discussed at Long Length of Stay Meetings, dates discussed:    Comments:  04/16/2013 1500 Faxed order for [facility] aide to [place]. Left message on vm, for rep Jacki Cones to add aide to [facility]. Made rep aware pt was d/c home today.  Provided family with list of private duty agencies. Pt states she was getting up better and moving around. Isidoro Donning RN CCM Case Mgmt phone [phone]   04/15/2012 1630 Faxed confirmation of orders paperwork for DME to [facility]. Faxed orders for [facility] and F2F to [facility]. Isidoro Donning RN CCM Case Mgmt phone [phone]  04/15/2012 1100 NCM spoke to pt and husband. States she prefers [place] for [facility]. States she needs wheelchair and hospital bed for home. PT recommended DME for home. Orders and required Medicare documentation to qualify for DME faxed to [facility]. Spoke to [place], rep with [facility]. They will contact pt  or family member to schedule a delivery time for DME. NCM made aware pt scheduled for d/c home on 04/16/2012. Will provided list of private duty agencies to family. Will leave note for MD. Pt is requesting an HH aide to assist with bathing. Isidoro Donning RN CCM Case Mgmt phone [phone]   Patient is originally scheduled to move to independent living facility, [facility] within the next few days when patient had this fall.

## 2012-04-16 NOTE — Progress Notes (Signed)
Physical Therapy Treatment Patient Details Name: Kristie [name] MRN: [id number] DOB: January 03, 1929 Today's Date: 04/16/2012 Time: 4098-1191 PT Time Calculation (min): 47 min  PT Assessment / Plan / Recommendation Comments on Treatment Session  Pt's husband and daughter present for entire session.  All feel prepared for DC home today    Follow Up Recommendations  Home health PT;Supervision/Assistance - 24 hour    Barriers to Discharge        Equipment Recommendations  Hospital bed;Wheelchair (measurements);Other (comment)    Recommendations for Other Services    Frequency Min 4X/week   Plan Discharge plan remains appropriate;Frequency remains appropriate    Precautions / Restrictions Precautions Precautions: Fall Required Braces or Orthoses: Other Brace/Splint Other Brace/Splint: Pt has AFO for right foot drop Restrictions Weight Bearing Restrictions: No Other Position/Activity Restrictions: WBAT   Pertinent Vitals/Pain 4/10 RLE pain with mobility    Mobility  Bed Mobility Bed Mobility: Supine to Sit;Sit to Supine Supine to Sit: 5: Supervision;Other (comment);HOB elevated (with use of leg lifter) Sitting - Scoot to Edge of Bed: 4: Min assist Sit to Supine: 4: Min assist;HOB flat (Assist for RLE) Details for Bed Mobility Assistance:  (CUes for easiest technique) Transfers Transfers: Sit to Stand;Stand to Sit;Stand Pivot Transfers Sit to Stand: 4: Min assist;With upper extremity assist;From bed Stand to Sit: 5: Supervision;With upper extremity assist;To bed;To chair/3-in-1 Stand Pivot Transfers: 4: Min guard Details for Transfer Assistance: Cues for technique and placement of hands and LE's Ambulation/Gait Ambulation/Gait Assistance: 4: Min guard Ambulation Distance (Feet): 15 Feet Assistive device: Rolling walker Gait Pattern: Step-to pattern;Decreased stride length Gait velocity: decreased speed General Gait Details: much improved from previous days.   Stairs:  No Wheelchair Mobility Wheelchair Mobility: No (Instructed pt/family on WC use)    Exercises     PT Diagnosis:    PT Problem List:   PT Treatment Interventions:     PT Goals Acute Rehab PT Goals PT Goal Formulation: With patient/family Time For Goal Achievement: 04/18/12 Potential to Achieve Goals: Good Pt will Transfer Bed to Chair/Chair to Bed: with min assist PT Transfer Goal: Bed to Chair/Chair to Bed - Progress: Met Pt will Ambulate: 1 - 15 feet;with min assist;with rolling walker PT Goal: Ambulate - Progress: Met Pt will Go Up / Down Stairs: 1-2 stairs;with mod assist;with rolling walker PT Goal: Up/Down Stairs - Progress: Not met  Visit Information  Last PT Received On: 04/16/12 Assistance Needed: +1    Subjective Data  Subjective: I'm doing so muxh better   Cognition  Overall Cognitive Status: Appears within functional limits for tasks assessed/performed Arousal/Alertness: Awake/alert Orientation Level: Appears intact for tasks assessed;Oriented X4 / Intact Behavior During Session: [facility] for tasks performed    Balance     End of Session PT - End of Session Equipment Utilized During Treatment: Gait belt;Right ankle foot orthosis Activity Tolerance: Patient tolerated treatment well Patient left: in bed;with call bell/phone within reach;with family/visitor present   GP     [name] 04/16/2012, 12:00 PM [name], PT  [phone] 04/16/2012

## 2012-10-09 ENCOUNTER — Other Ambulatory Visit: Payer: Self-pay

## 2013-01-04 ENCOUNTER — Telehealth: Payer: Self-pay | Admitting: Neurology

## 2013-01-04 NOTE — Telephone Encounter (Signed)
Patient is calling to make her annual f/u.  Prior patient of Dr. Imagene Gurney.  I'll be glad to schedule if I know which Neurologist or NP to schedule her with.

## 2013-01-04 NOTE — Telephone Encounter (Signed)
Printed out to go to scheduling.

## 2013-02-15 ENCOUNTER — Other Ambulatory Visit: Payer: Self-pay | Admitting: Diagnostic Neuroimaging

## 2013-02-15 NOTE — Telephone Encounter (Signed)
FOrmer Love patient assigned to Dr Terrace Arabia.  Has appt in Oct

## 2013-04-24 ENCOUNTER — Encounter: Payer: Self-pay | Admitting: Neurology

## 2013-04-24 DIAGNOSIS — R531 Weakness: Secondary | ICD-10-CM | POA: Insufficient documentation

## 2013-04-27 ENCOUNTER — Ambulatory Visit (INDEPENDENT_AMBULATORY_CARE_PROVIDER_SITE_OTHER): Payer: Medicare Other | Admitting: Neurology

## 2013-04-27 ENCOUNTER — Encounter: Payer: Self-pay | Admitting: Neurology

## 2013-04-27 VITALS — BP 138/81 | HR 63 | Ht 68.0 in | Wt 187.0 lb

## 2013-04-27 DIAGNOSIS — R269 Unspecified abnormalities of gait and mobility: Secondary | ICD-10-CM | POA: Insufficient documentation

## 2013-04-27 DIAGNOSIS — M216X9 Other acquired deformities of unspecified foot: Secondary | ICD-10-CM

## 2013-04-27 DIAGNOSIS — M21371 Foot drop, right foot: Secondary | ICD-10-CM | POA: Insufficient documentation

## 2013-04-27 MED ORDER — DONEPEZIL HCL 10 MG PO TABS: 10.0000 mg | ORAL_TABLET | Freq: Two times a day (BID) | ORAL | Status: DC

## 2013-04-27 NOTE — Progress Notes (Signed)
[facility]  PATIENT: Kristie [name] DOB: 1929/02/27  HISTORICAL  Kristie [name] is a 79 years old right-handed Caucasian female, patient of Dr. Sandria Manly, followup for right foot drop, lumbar radiculopathy, gait difficulty mild memory trouble,  She had a past medical history of right pelvic schwannoma, had surgery in 1988, she has suffered right distal leg weakness, atrophy postsurgically, She also had a history of lumbar stenosis, underwent L4-5 decompression surgery by Dr Laural Benes at [facility], she has mild left foot drop from the surgery,  at baseline, she ambulated with her right foot orthotics, which works very well for her, using cane or walker, she denies significant low back pain,   She also had a history of mild memory trouble, she lives [facility] at [facility], still driving, she tends to forget people's, book's name, still independent of living, her mother suffered Alzheimer's disease, she has been taking Aricept at 23 mg every day, there was no significant change of her memory complaints   Per record, CAT scan of the pelvic showed stable CT of the pelvis,  no evidence of recurrent tumor.  Laboratory evaluation previously showed a normal protein electrophoresis, ACE, TSH, CBC, CMP, B12, heavy-metal,    today she comes seen wants to refill her Aricept, she is also taking gabapentin 800 mg 3 times a day for bilateral lower extremity paresthesia, which was refilled by her primary care doctor  REVIEW OF SYSTEMS: Full 14 system review of systems performed and notable only for achy muscles, weakness, insomnia  ALLERGIES: No Known Allergies  HOME MEDICATIONS: Outpatient Prescriptions Prior to Visit  Medication Sig Dispense Refill  . acidophilus (RISAQUAD) CAPS Take 1 capsule by mouth daily.      Marland Kitchen amLODipine (NORVASC) 10 MG tablet Take 10 mg by mouth daily.      Marland Kitchen ARICEPT 23 MG TABS tablet TAKE 1 TABLET ONCE DAILY.  30 tablet  3  . aspirin 81 MG chewable tablet  Chew 81 mg by mouth daily.      Marland Kitchen atenolol (TENORMIN) 100 MG tablet Take 100 mg by mouth daily.      . cholecalciferol (VITAMIN D) 1000 UNITS tablet Take 5,000 Units by mouth daily.      Marland Kitchen gabapentin (NEURONTIN) 800 MG tablet Take 800 mg by mouth 3 (three) times daily.      Marland Kitchen losartan (COZAAR) 50 MG tablet Take 50 mg by mouth daily.      . meloxicam (MOBIC) 15 MG tablet Take 7.5 mg by mouth daily.      . mirabegron ER (MYRBETRIQ) 25 MG TB24 Take 25 mg by mouth daily.      . Multiple Vitamin (MULTIVITAMIN WITH MINERALS) TABS Take 1 tablet by mouth daily.      . sertraline (ZOLOFT) 50 MG tablet Take 50 mg by mouth daily.      . simvastatin (ZOCOR) 10 MG tablet Take 10 mg by mouth at bedtime.      . solifenacin (VESICARE) 10 MG tablet Take by mouth daily.      Marland Kitchen zolpidem (AMBIEN) 10 MG tablet Take 10 mg by mouth at bedtime as needed. For sleep      . acetaminophen (TYLENOL) 325 MG tablet Take 325 mg by mouth every 6 (six) hours as needed. For pain      . desmopressin (DDAVP) 0.2 MG tablet Take 0.2 mg by mouth daily.      Marland Kitchen docusate sodium 100 MG CAPS Take 100 mg by mouth 2 (  two) times daily.      . traMADol (ULTRAM) 50 MG tablet Take 1 tablet (50 mg total) by mouth every 6 (six) hours as needed for pain.  20 tablet  0   No facility-administered medications prior to visit.    PAST MEDICAL HISTORY: Past Medical History  Diagnosis Date  . Hypertension   . Hyperlipidemia   . Memory loss   . Low back pain   . Tremor   . Weakness   . Lesion of sciatic nerve     PAST SURGICAL HISTORY: Past Surgical History  Procedure Laterality Date  . Appendectomy    . Abdominal hysterectomy    . Tonsillectomy    . Knee surgery      FAMILY HISTORY: Family History  Problem Relation Age of Onset  . Breast cancer Mother   . Dementia Father     SOCIAL HISTORY:    PHYSICAL EXAM   Filed Vitals:   04/27/13 1533  BP: 138/81  Pulse: 63  Height: 5\' 8"  (1.727 m)  Weight: 187 lb (84.823 kg)    Body mass index is 28.44 kg/(m^2).   Generalized: In no acute distress  Neck: Supple, no carotid bruits   Cardiac: Regular rate rhythm  Pulmonary: Clear to auscultation bilaterally  Musculoskeletal: No deformity  Neurological examination  Mentation: Alert oriented to time, place, history taking, and causual conversation. Montreal cognitive assessment (MOCA) was 30/30  Cranial nerve II-XII: Pupils were equal round reactive to light extraocular movements were full, visual field were full on confrontational test. facial sensation and strength were normal. hearing was intact to finger rubbing bilaterally. Uvula tongue midline.  head turning and shoulder shrug and were normal and symmetric.Tongue protrusion into cheek strength was normal.  Motor: Right ankle dorsiflexion 0, plantar flexion 0, left ankle dorsiflexion 4, proximal lower extremity motor strength is normal  Sensory:  length dependent decreased  fine touch, pinprick, to midshin level,.  Coordination: Normal finger to nose, heel-to-shin bilaterally there was no truncal ataxia  Gait:  dragging right leg, mild left foot drop  Romberg signs: Negative  Deep tendon reflexes: Brachioradialis 2/2, biceps 2/2, triceps 2/2, patellar 2/2, Achilles absent, plantar responses were flexor bilaterally.   DIAGNOSTIC DATA (LABS, IMAGING, TESTING) - I reviewed patient records, labs, notes, testing and imaging myself where available.  Lab Results  Component Value Date   WBC 9.9 04/13/2012   HGB 15.4* 04/13/2012   HCT 44.9 04/13/2012   MCV 91.4 04/13/2012   PLT 198 04/13/2012      Component Value Date/Time   NA 136 04/15/2012 0520   K 3.5 04/15/2012 0520   CL 103 04/15/2012 0520   CO2 26 04/15/2012 0520   GLUCOSE 151* 04/15/2012 0520   BUN 16 04/15/2012 0520   CREATININE 0.50 04/15/2012 0520   CALCIUM 9.2 04/15/2012 0520   PROT 6.0 10/25/2007 1515   ALBUMIN 3.7 10/25/2007 1515   AST 22 10/25/2007 1515   ALT 20 10/25/2007 1515   ALKPHOS 41  10/25/2007 1515   BILITOT 0.7 10/25/2007 1515   GFRNONAA 88* 04/15/2012 0520   GFRAA >90 04/15/2012 0520    ASSESSMENT AND PLAN   79 years old Caucasian female, with past medical history of right pelvic schwannoma, status post resection, with right foot drop, also history of lumbar stenosis, status post decompression she wear a right ankle orthotics,, mild left ankle dorsi flexion weakness,She has profound gait difficulty is also taking Aricept 23 mg every day concerning of mild memory trouble, mother  has suffered Alzheimer's disease  1 I will refill her donepezil 10 mg twice a day. 2 return to clinic as needed.             Levert Feinstein, M.D. Ph.D.  [facility] [address], [address] [place], [place] [address] [phone]

## 2013-10-21 DIAGNOSIS — R490 Dysphonia: Secondary | ICD-10-CM | POA: Insufficient documentation

## 2014-02-18 LAB — CBC AND DIFFERENTIAL
HEMATOCRIT: 46 % (ref 36–46)
Hemoglobin: 16 g/dL (ref 12.0–16.0)
Platelets: 265 10*3/uL (ref 150–399)
WBC: 6 10^3/mL

## 2014-02-18 LAB — TSH: TSH: 1.81 u[IU]/mL (ref 0.41–5.90)

## 2014-02-18 LAB — HEPATIC FUNCTION PANEL
ALT: 16 U/L (ref 7–35)
AST: 19 U/L (ref 13–35)
Alkaline Phosphatase: 59 U/L (ref 25–125)
BILIRUBIN, TOTAL: 0.6 mg/dL

## 2014-02-18 LAB — BASIC METABOLIC PANEL
BUN: 15 mg/dL (ref 4–21)
Creatinine: 0.6 mg/dL (ref 0.5–1.1)
GLUCOSE: 111 mg/dL
POTASSIUM: 4.5 mmol/L (ref 3.4–5.3)
SODIUM: 138 mmol/L (ref 137–147)

## 2014-02-18 LAB — LIPID PANEL
Cholesterol: 189 mg/dL (ref 0–200)
HDL: 61 mg/dL (ref 35–70)
LDL CALC: 96 mg/dL
Triglycerides: 161 mg/dL — AB (ref 40–160)

## 2014-04-25 ENCOUNTER — Non-Acute Institutional Stay: Payer: Medicare Other | Admitting: Internal Medicine

## 2014-04-25 VITALS — BP 148/84 | HR 68 | Temp 98.3°F | Ht 68.0 in | Wt 181.0 lb

## 2014-04-25 DIAGNOSIS — I10 Essential (primary) hypertension: Secondary | ICD-10-CM

## 2014-04-25 DIAGNOSIS — M797 Fibromyalgia: Secondary | ICD-10-CM

## 2014-04-25 DIAGNOSIS — M21371 Foot drop, right foot: Secondary | ICD-10-CM

## 2014-04-25 DIAGNOSIS — M48061 Spinal stenosis, lumbar region without neurogenic claudication: Secondary | ICD-10-CM

## 2014-04-25 DIAGNOSIS — M4806 Spinal stenosis, lumbar region: Secondary | ICD-10-CM

## 2014-04-25 DIAGNOSIS — N3946 Mixed incontinence: Secondary | ICD-10-CM

## 2014-04-25 DIAGNOSIS — G4700 Insomnia, unspecified: Secondary | ICD-10-CM | POA: Insufficient documentation

## 2014-04-25 DIAGNOSIS — R32 Unspecified urinary incontinence: Secondary | ICD-10-CM | POA: Insufficient documentation

## 2014-04-25 DIAGNOSIS — M810 Age-related osteoporosis without current pathological fracture: Secondary | ICD-10-CM

## 2014-04-25 HISTORY — DX: Spinal stenosis, lumbar region without neurogenic claudication: M48.061

## 2014-04-25 HISTORY — DX: Fibromyalgia: M79.7

## 2014-04-25 NOTE — Progress Notes (Signed)
Patient ID: Kristie [name], female   DOB: 06/27/1929, 80 y.o.   MRN: [id number]    HISTORY AND PHYSICAL  Location:  [place] Place of Service: Clinic (12)   Extended Emergency Contact Information Primary Emergency Contact: Gruendler,Joseph Address: [address], [place] Phone: [phone] Relation: Spouse   Chief Complaint  Patient presents with  . Medical Management of Chronic Issues    New Patient: Primary doctor is Dr. Noah Delaine, had physical 02/18/14. Wants to talk with Dr. Nyoka Cowden to see if she wants to switch doctors. Wants to review medications, talk about [place].     HPI:  Spinal stenosis of lumbar region: mild low back discomfort  Fibromyalgia: controlled. Follwed by Dr. 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 Hypertension   . Hyperlipidemia   . Memory loss   . Low back pain   . Tremor   . Weakness   . Lesion of sciatic nerve   . Spinal stenosis of lumbar region 04/25/2014  . Fibromyalgia 04/25/2014    Past Surgical History  Procedure Laterality Date  . Appendectomy    . Abdominal hysterectomy  1963  . Tonsillectomy  1940  . Knee surgery  2009  . Colonoscopy w/ polypectomy  1974and 2002  . Cataract extraction w/ intraocular lens  implant, bilateral  2005 and 2007  . Lumbar laminectomy  2012    L4-5 with spinous process fixation (06/22/11) Dr. Tommi Emery  . Schwanoma  1987    residual right foot drop and right leg pain       History   Social History  . Marital Status: Married    Spouse Name: N/A    Number of Children: 4  . Years of Education: N/A   Occupational History  . retired [place]      retired   Social History Main Topics    . Smoking status: Former Smoker    Quit date: 04/25/1974  . Smokeless tobacco: Never Used     Comment: Quit thirty years ago.  . Alcohol Use: 1.2 oz/week    2 Glasses of wine per week     Comment: 2 glasses of wine daily  . Drug Use: No  . Sexual Activity: Not on file   Other Topics Concern  . Not on file   Social History Narrative   Patient is retired and lives with her husband at [place] since 2013   Patient  is right handed.   Former smoker for 20 years   Exercise none                           reports that she quit smoking about 40 years ago. She has never used smokeless tobacco. She reports that she drinks about 1.2 ounces of alcohol per week. She reports that she does not use illicit drugs.  Immunization History  Administered Date(s) Administered  . Influenza Split 04/15/2012    No Known Allergies  Medications: Patient's Medications  New Prescriptions   No medications on file  Previous Medications   ACETAMINOPHEN (TYLENOL) 500 MG TABLET    Take 500 mg by mouth. Take 2  tablets at night   ACIDOPHILUS (RISAQUAD) CAPS    Take 1 capsule by mouth daily.   AMLODIPINE (NORVASC) 10 MG TABLET    Take 10 mg by mouth daily.   ASPIRIN 81 MG CHEWABLE TABLET    Chew 81 mg by mouth daily.   ATENOLOL (TENORMIN) 100 MG TABLET    Take 100 mg by mouth daily.   CHOLECALCIFEROL (VITAMIN D) 1000 UNITS TABLET    Take by mouth daily. Take 2,000 units daily   DONEPEZIL (ARICEPT) 10 MG TABLET    Take 1 tablet (10 mg total) by mouth 2 (two) times daily.   FLUTICASONE (FLONASE) 50 MCG/ACT NASAL SPRAY    2 sprays in each nostril once daily   GABAPENTIN (NEURONTIN) 800 MG TABLET    Take 800 mg by mouth 3 (three) times daily.   HYDROCODONE-ACETAMINOPHEN (NORCO/VICODIN) 5-325 MG PER TABLET    Take 1/2 - 1 tablet at bedtime as needed for pain   LOSARTAN (COZAAR) 50 MG TABLET    Take 50 mg by mouth daily.   MAGNESIUM 250 MG TABS    Take by mouth. Take one tablet daily    MIRABEGRON ER (MYRBETRIQ) 25 MG TB24    Take 25 mg by mouth daily.   MULTIPLE VITAMIN (MULTIVITAMIN WITH MINERALS) TABS    Take 1 tablet by mouth daily.   SERTRALINE (ZOLOFT) 50 MG TABLET    Take 50 mg by mouth daily.   SIMVASTATIN (ZOCOR) 10 MG TABLET    Take 10 mg by mouth at bedtime.   SOLIFENACIN (VESICARE) 10 MG TABLET    Take by mouth daily.   ZOLPIDEM (AMBIEN) 10 MG TABLET    Take 10 mg by mouth at bedtime as needed. For sleep  Modified Medications   No medications on file  Discontinued Medications   ACETAMINOPHEN (TYLENOL) 325 MG TABLET    Take 325 mg by mouth every 6 (six) hours as needed. For pain   MELOXICAM (MOBIC) 15 MG TABLET    Take 7.5 mg by mouth daily.     Review of Systems  Constitutional: Negative for fever, chills, diaphoresis, activity change, appetite change, fatigue and unexpected weight change.  HENT: Negative for congestion, ear discharge, ear pain, hearing loss, postnasal drip, rhinorrhea, sore throat, tinnitus, trouble swallowing and voice change.   Eyes: Negative for pain, redness, itching and visual disturbance.  Respiratory: Negative for cough, choking, shortness of breath and wheezing.   Cardiovascular: Negative for chest pain, palpitations and leg swelling.  Gastrointestinal: Negative for nausea, abdominal pain, diarrhea, constipation and abdominal distention.  Endocrine: Negative for cold intolerance, heat intolerance, polydipsia, polyphagia and polyuria.  Genitourinary: Negative for dysuria, urgency, frequency, hematuria, flank pain, vaginal discharge, difficulty urinating and pelvic pain.  Musculoskeletal: Positive for gait problem (cane). Negative for arthralgias, back pain, myalgias, neck pain and neck stiffness.       Right foot drop  Skin: Negative for color change, pallor and rash.  Allergic/Immunologic: Negative.   Neurological: Negative for dizziness, tremors, seizures, syncope, weakness, numbness and headaches.  Hematological: Negative for  adenopathy. Does not bruise/bleed easily.  Psychiatric/Behavioral: Negative for suicidal ideas, hallucinations, behavioral problems, confusion, sleep disturbance, dysphoric mood and agitation. The patient is not nervous/anxious and is not hyperactive.     Filed Vitals:   04/25/14 1352  BP: 148/84  Pulse: 68  Temp: 98.3 F (36.8 C)  TempSrc: Oral  Height: 5\' 8"  (1.727 m)  Weight: 181 lb (82.101 kg)   Body mass index is  27.53 kg/(m^2).  Physical Exam  Constitutional: She is oriented to person, place, and time. She appears well-developed and well-nourished. No distress.  HENT:  Right Ear: External ear normal.  Left Ear: External ear normal.  Nose: Nose normal.  Mouth/Throat: Oropharynx is clear and moist. No oropharyngeal exudate.  Eyes: Conjunctivae and EOM are normal. Pupils are equal, round, and reactive to light. No scleral icterus.  Neck: No JVD present. No tracheal deviation present. No thyromegaly present.  Cardiovascular: Normal rate, regular rhythm, normal heart sounds and intact distal pulses.  Exam reveals no gallop and no friction rub.   No murmur heard. Pulmonary/Chest: Effort normal. No respiratory distress. She has no wheezes. She has no rales. She exhibits no tenderness.  Abdominal: She exhibits no distension and no mass. There is no tenderness.  Musculoskeletal: Normal range of motion. She exhibits no edema and no tenderness.  RIGHT afo FOR FOOT DROP  Lymphadenopathy:    She has no cervical adenopathy.  Neurological: She is alert and oriented to person, place, and time. No cranial nerve deficit. Coordination normal.  Skin: No rash noted. She is not diaphoretic. No erythema. No pallor.  Psychiatric: She has a normal mood and affect. Her behavior is normal. Judgment and thought content normal.     Labs reviewed: Nursing Home on 04/25/2014  Component Date Value Ref Range Status  . Hemoglobin 02/18/2014 16.0  12.0 - 16.0 g/dL Final  . HCT 02/18/2014 46  36 - 46 %  Final  . Platelets 02/18/2014 265  150 - 399 K/L Final  . WBC 02/18/2014 6.0   Final  . Glucose 02/18/2014 111   Final  . BUN 02/18/2014 15  4 - 21 mg/dL Final  . Creatinine 02/18/2014 0.6  0.5 - 1.1 mg/dL Final  . Potassium 02/18/2014 4.5  3.4 - 5.3 mmol/L Final  . Sodium 02/18/2014 138  137 - 147 mmol/L Final  . Triglycerides 02/18/2014 161* 40 - 160 mg/dL Final  . Cholesterol 02/18/2014 189  0 - 200 mg/dL Final  . HDL 02/18/2014 61  35 - 70 mg/dL Final  . LDL Cholesterol 02/18/2014 96   Final  . Alkaline Phosphatase 02/18/2014 59  25 - 125 U/L Final  . ALT 02/18/2014 16  7 - 35 U/L Final  . AST 02/18/2014 19  13 - 35 U/L Final  . Bilirubin, Total 02/18/2014 0.6   Final  . TSH 02/18/2014 1.81  0.41 - 5.90 uIU/mL Final    Assessment/Plan  1. Spinal stenosis of lumbar region Some back discomfort  2. Fibromyalgia Followed by Dr. Estanislado Pandy  3. Mixed incontinence stable  4. Senile osteoporosis observe  5. Insomnia Some relief from Ambien  6. Essential hypertension controlled  7. Right foot drop Using AFO regularly

## 2014-09-12 ENCOUNTER — Encounter: Payer: Self-pay | Admitting: Internal Medicine

## 2016-06-24 DIAGNOSIS — N390 Urinary tract infection, site not specified: Secondary | ICD-10-CM | POA: Insufficient documentation

## 2016-07-01 ENCOUNTER — Other Ambulatory Visit: Payer: Self-pay | Admitting: Rheumatology

## 2016-07-01 ENCOUNTER — Other Ambulatory Visit: Payer: Self-pay | Admitting: *Deleted

## 2016-07-01 NOTE — Telephone Encounter (Signed)
Last Visit: 04/20/16  Next Visit due January/Febuary 2018 Labs: 01/14/16 WNL  Okay to refill Ambien?

## 2016-07-08 ENCOUNTER — Encounter: Payer: Self-pay | Admitting: Nurse Practitioner

## 2016-08-05 ENCOUNTER — Encounter: Payer: Self-pay | Admitting: Nurse Practitioner

## 2016-08-05 ENCOUNTER — Encounter: Payer: Self-pay | Admitting: *Deleted

## 2016-08-05 ENCOUNTER — Non-Acute Institutional Stay: Payer: Medicare Other | Admitting: Nurse Practitioner

## 2016-08-05 VITALS — BP 160/120 | HR 101 | Temp 98.5°F | Resp 20 | Ht 68.0 in | Wt 190.8 lb

## 2016-08-05 DIAGNOSIS — M48061 Spinal stenosis, lumbar region without neurogenic claudication: Secondary | ICD-10-CM | POA: Diagnosis not present

## 2016-08-05 DIAGNOSIS — M797 Fibromyalgia: Secondary | ICD-10-CM | POA: Diagnosis not present

## 2016-08-05 DIAGNOSIS — I10 Essential (primary) hypertension: Secondary | ICD-10-CM | POA: Diagnosis not present

## 2016-08-05 DIAGNOSIS — M21371 Foot drop, right foot: Secondary | ICD-10-CM | POA: Diagnosis not present

## 2016-08-05 DIAGNOSIS — F329 Major depressive disorder, single episode, unspecified: Secondary | ICD-10-CM

## 2016-08-05 DIAGNOSIS — E785 Hyperlipidemia, unspecified: Secondary | ICD-10-CM

## 2016-08-05 DIAGNOSIS — G4700 Insomnia, unspecified: Secondary | ICD-10-CM | POA: Diagnosis not present

## 2016-08-05 DIAGNOSIS — R413 Other amnesia: Secondary | ICD-10-CM

## 2016-08-05 DIAGNOSIS — F32A Depression, unspecified: Secondary | ICD-10-CM

## 2016-08-05 DIAGNOSIS — N3946 Mixed incontinence: Secondary | ICD-10-CM | POA: Diagnosis not present

## 2016-08-05 MED ORDER — AMLODIPINE BESYLATE 10 MG PO TABS: 10.0000 mg | ORAL_TABLET | Freq: Every day | ORAL | 2 refills | Status: DC

## 2016-08-05 MED ORDER — SERTRALINE HCL 50 MG PO TABS: 50.0000 mg | ORAL_TABLET | Freq: Every day | ORAL | 0 refills | Status: DC

## 2016-08-05 NOTE — Assessment & Plan Note (Addendum)
Lower leg weakness, s/p surgery, failed, ambulates with walker

## 2016-08-05 NOTE — Assessment & Plan Note (Signed)
Continue Sertraline 50mg

## 2016-08-05 NOTE — Assessment & Plan Note (Signed)
Continue taking Vesicare and Myrbetriq,

## 2016-08-05 NOTE — Assessment & Plan Note (Signed)
The right foot drop, brace

## 2016-08-05 NOTE — Assessment & Plan Note (Signed)
Memory lapses, continue Aricept, update MMSE

## 2016-08-05 NOTE — Assessment & Plan Note (Signed)
Tylenol 2 tabs at 4pm, Ibuprofen bid.   

## 2016-08-05 NOTE — Progress Notes (Signed)
Provider:  Marlana Latus NP Location:   [facility]   Place of Service:  Clinic (12)  PCP: Default, Provider, MD Patient Care Team: Provider Default, MD as PCP - General Bo Merino, MD as Consulting Physician (Rheumatology) Juanita Craver, MD as Consulting Physician (Gastroenterology) Vickey Huger, MD as Consulting Physician (Orthopedic Surgery) Calvert Cantor, MD as Consulting Physician (Ophthalmology)  Extended Emergency Contact Information Primary Emergency Contact: Gruendler,Joseph Address: [address], [place] Phone: [phone] Relation: Spouse  Code Status: DNR Goals of Care: Advanced Directive information Advanced Directives 04/14/2012  Does Patient Have a Medical Advance Directive? Patient has advance directive, copy not in chart  Type of Advance Directive [place];Living will;Advance instruction for mental health treatment  [place] in Chart? Copy requested from family  Pre-existing out of facility DNR order (yellow form or pink MOST form) No      Chief Complaint  Patient presents with  . Establish Care    HPI: Patient is a 83 y.o. female seen today for admission to establish care   Hx of HTN, not controlled due to out of her Amlodipine 10mg , otherwise controlled on Losartan 50mg , Amlodipine 10mg . Insomnia Ambien 10mg , depression stable on Sertraline 50mg , incontinent 2-3 x/night, f/u Urology, taking Vesicare and Myrbetriq, Memory lapses, taking Aricept, neuropathy, her neurologists passed away and retired. Taking Gabapentin. Fibromyalgia taking Tylenol 2 tabs at 4pm, Ibuprofen bid.   Past Medical History:  Diagnosis Date  . Cataract   . Fibromyalgia 04/25/2014  . Hyperlipidemia   . Hypertension   . Lesion of sciatic nerve   . Low back pain   . Memory loss   . Spinal stenosis of lumbar region 04/25/2014  . Tremor   . Weakness    Past Surgical History:  Procedure  Laterality Date  . ABDOMINAL HYSTERECTOMY  1963  . APPENDECTOMY    . CATARACT EXTRACTION W/ INTRAOCULAR LENS  IMPLANT, BILATERAL  2005 and 2007  . COLON SURGERY  1974   colon polyp  . COLON SURGERY  2002   colon polyps(2)  Dr Collene Mares  . COLONOSCOPY W/ POLYPECTOMY  1974and 2002  . EYE SURGERY  2005/2007   cataract renoval  Dr. Bing Plume  . KNEE SURGERY  2009   Dr. Ronnie Derby  . LUMBAR LAMINECTOMY  2012   L4-5 with spinous process fixation (06/22/11) Dr. Tommi Emery  . schwanoma  1987   residual right foot drop and right leg pain. Dr. Deirdre Pippins  . SMALL INTESTINE SURGERY  1990   bowel obstruction, Dr. Rebekah Chesterfield  . TONSILLECTOMY  1940    reports that she quit smoking about 42 years ago. She has never used smokeless tobacco. She reports that she drinks about 1.2 oz of alcohol per week . She reports that she does not use drugs. Social History   Social History  . Marital status: Married    Spouse name: N/A  . Number of children: 4  . Years of education: N/A   Occupational History  . retired Public relations account executive  Retired    retired   Social History Main Topics  . Smoking status: Former Smoker    Quit date: 04/25/1974  . Smokeless tobacco: Never Used     Comment: Quit thirty years ago.  . Alcohol use 1.2 oz/week    2 Glasses of wine per week     Comment: 2 glasses of wine daily  . Drug use:  No  . Sexual activity: Not on file   Other Topics Concern  . Not on file   Social History Narrative   Patient is retired Public relations account executive and lives with her husband, (married since 1980) at [place] since 2013   Patient  is right handed. She drinks 1 cup of coffee per day.    She does not have any pets.   Former smoker for 20 years   Exercise none                          Functional Status Survey:    Family History  Problem Relation Age of Onset  . Breast cancer Mother   . Heart disease Mother   . Dementia Father   . Cancer Sister     Esophageal     Health Maintenance  Topic Date Due  .  TETANUS/TDAP  08/20/1949  . ZOSTAVAX  08/20/1990  . DEXA SCAN  08/21/1995  . PNA vac Low Risk Adult (1 of 2 - PCV13) 08/21/1995  . INFLUENZA VACCINE  Completed    Allergies  Allergen Reactions  . Restoril [Temazepam]     Nightmares    Allergies as of 08/05/2016      Reactions   Restoril [temazepam]    Nightmares      Medication List       Accurate as of 08/05/16  4:16 PM. Always use your most recent med list.          acetaminophen 500 MG tablet Commonly known as:  TYLENOL Take 500 mg by mouth. Take 2 tablets at night   acidophilus Caps capsule Take 1 capsule by mouth daily.   amLODipine 10 MG tablet Commonly known as:  NORVASC Take 10 mg by mouth daily.   aspirin 81 MG chewable tablet Chew 81 mg by mouth daily.   cholecalciferol 1000 units tablet Commonly known as:  VITAMIN D Take by mouth daily. Take 2,000 units daily   donepezil 10 MG tablet Commonly known as:  ARICEPT Take 1 tablet (10 mg total) by mouth 2 (two) times daily.   fluticasone 50 MCG/ACT nasal spray Commonly known as:  FLONASE 2 sprays in each nostril once daily   gabapentin 800 MG tablet Commonly known as:  NEURONTIN Take 800 mg by mouth 3 (three) times daily.   losartan 50 MG tablet Commonly known as:  COZAAR Take 50 mg by mouth daily.   Magnesium 250 MG Tabs Take by mouth. Take one tablet daily   multivitamin with minerals Tabs tablet Take 1 tablet by mouth daily.   MYRBETRIQ 25 MG Tb24 tablet Generic drug:  mirabegron ER Take 25 mg by mouth daily.   sertraline 50 MG tablet Commonly known as:  ZOLOFT Take 50 mg by mouth daily.   simvastatin 10 MG tablet Commonly known as:  ZOCOR Take 10 mg by mouth at bedtime.   solifenacin 10 MG tablet Commonly known as:  VESICARE Take by mouth daily.   zolpidem 10 MG tablet Commonly known as:  AMBIEN TAKE 1 TABLET AT BEDTIME AS NEEDED.       Review of Systems  Constitutional: Positive for fatigue. Negative for activity  change, appetite change, chills, diaphoresis, fever and unexpected weight change.  HENT: Negative for congestion, ear discharge, ear pain, hearing loss, postnasal drip, rhinorrhea, sore throat, tinnitus, trouble swallowing and voice change.   Eyes: Negative for pain, redness, itching and visual disturbance.  Respiratory: Negative for cough, choking,  shortness of breath and wheezing.   Cardiovascular: Negative for chest pain, palpitations and leg swelling.  Gastrointestinal: Negative for abdominal distention, abdominal pain, constipation, diarrhea and nausea.  Endocrine: Negative for cold intolerance, heat intolerance, polydipsia, polyphagia and polyuria.  Genitourinary: Positive for frequency. Negative for difficulty urinating, dysuria, flank pain, hematuria, pelvic pain, urgency and vaginal discharge.  Musculoskeletal: Positive for gait problem (cane). Negative for arthralgias, back pain, myalgias, neck pain and neck stiffness.       Right foot drop  Skin: Negative for color change, pallor and rash.  Allergic/Immunologic: Negative.   Neurological: Negative for dizziness, tremors, seizures, syncope, weakness, numbness and headaches.       Lower body weakness, ambulates with walker  Hematological: Negative for adenopathy. Does not bruise/bleed easily.  Psychiatric/Behavioral: Positive for sleep disturbance. Negative for agitation, behavioral problems, confusion, dysphoric mood, hallucinations and suicidal ideas. The patient is nervous/anxious. The patient is not hyperactive.     Vitals:   08/05/16 1522  BP: (!) 160/120  Pulse: (!) 101  Resp: 20  Temp: 98.5 F (36.9 C)  SpO2: 93%  Weight: 190 lb 12.8 oz (86.5 kg)  Height: 5\' 8"  (1.727 m)   Body mass index is 29.01 kg/m. Physical Exam  Constitutional: She is oriented to person, place, and time. She appears well-developed and well-nourished. No distress.  HENT:  Right Ear: External ear normal.  Left Ear: External ear normal.  Nose:  Nose normal.  Mouth/Throat: Oropharynx is clear and moist. No oropharyngeal exudate.  Eyes: Conjunctivae and EOM are normal. Pupils are equal, round, and reactive to light. No scleral icterus.  Neck: No JVD present. No tracheal deviation present. No thyromegaly present.  Cardiovascular: Normal rate, regular rhythm, normal heart sounds and intact distal pulses.  Exam reveals no gallop and no friction rub.   No murmur heard. Pulmonary/Chest: Effort normal. No respiratory distress. She has no wheezes. She has no rales. She exhibits no tenderness.  Abdominal: She exhibits no distension and no mass. There is no tenderness.  Musculoskeletal: Normal range of motion. She exhibits no edema or tenderness.  RIGHT afo FOR FOOT DROP  Lymphadenopathy:    She has no cervical adenopathy.  Neurological: She is alert and oriented to person, place, and time. No cranial nerve deficit. Coordination normal.  Right foot drop, brace  Skin: No rash noted. She is not diaphoretic. No erythema. No pallor.  Psychiatric: She has a normal mood and affect. Her behavior is normal. Judgment and thought content normal.    Labs reviewed: Basic Metabolic Panel: No results for input(s): NA, K, CL, CO2, GLUCOSE, BUN, CREATININE, CALCIUM, MG, PHOS in the last 8760 hours. Liver Function Tests: No results for input(s): AST, ALT, ALKPHOS, BILITOT, PROT, ALBUMIN in the last 8760 hours. No results for input(s): LIPASE, AMYLASE in the last 8760 hours. No results for input(s): AMMONIA in the last 8760 hours. CBC: No results for input(s): WBC, NEUTROABS, HGB, HCT, MCV, PLT in the last 8760 hours. Cardiac Enzymes: No results for input(s): CKTOTAL, CKMB, CKMBINDEX, TROPONINI in the last 8760 hours. BNP: Invalid input(s): POCBNP No results found for: HGBA1C Lab Results  Component Value Date   TSH 1.81 02/18/2014   No results found for: VITAMINB12 No results found for: FOLATE No results found for: IRON, TIBC, FERRITIN  Imaging  and Procedures obtained prior to SNF admission: Dg Hip Complete Right  Result Date: 04/13/2012 *RADIOLOGY REPORT* Clinical Data: Fall.  Right hip pain. RIGHT HIP - COMPLETE 2+ VIEW Comparison: With Findings:  The patient has acute or right superior and inferior pubic rami fractures.  No other fracture is identified.  Left worse than right hip osteoarthritis is seen.  Postoperative change lower lumbar spine and right pelvis noted. IMPRESSION: 1.  Acute right superior and inferior pubic rami fractures. 2.  Left worse than right hip osteoarthritis. Original Report Authenticated By: Arvid Right. Luther Parody, M.D.    Assessment/Plan  Essential hypertension Continue Amlodipine 10mg , Losartan 50mg , update CBC CMP Hgb a1c, TSH, MMSE  Hyperlipidemia Update lipid panel. Continue Zocor  Fibromyalgia Tylenol 2 tabs at 4pm, Ibuprofen bid.    Insomnia Continue Ambien  Depression Continue Sertraline 50mg   Urine incontinence Continue taking Vesicare and Myrbetriq,  Memory deficit Memory lapses, continue Aricept, update MMSE  Spinal stenosis of lumbar region Lower leg weakness, s/p surgery, failed, ambulates with walker  Foot drop, right foot The right foot drop, brace  Family/ staff Communication: [place]  Labs/tests ordered: CBC CMP TSH Hgb a1c, lipid panel, MMSE

## 2016-08-05 NOTE — Assessment & Plan Note (Signed)
Continue Ambien. 

## 2016-08-05 NOTE — Assessment & Plan Note (Signed)
Continue Amlodipine 10mg , Losartan 50mg , update CBC CMP Hgb a1c, TSH, MMSE

## 2016-08-05 NOTE — Assessment & Plan Note (Addendum)
Update lipid panel. Continue Zocor

## 2016-08-16 ENCOUNTER — Other Ambulatory Visit: Payer: Self-pay | Admitting: Nurse Practitioner

## 2016-08-16 ENCOUNTER — Other Ambulatory Visit: Payer: Self-pay | Admitting: *Deleted

## 2016-08-16 DIAGNOSIS — F32A Depression, unspecified: Secondary | ICD-10-CM

## 2016-08-16 DIAGNOSIS — F329 Major depressive disorder, single episode, unspecified: Secondary | ICD-10-CM

## 2016-08-16 DIAGNOSIS — I10 Essential (primary) hypertension: Secondary | ICD-10-CM

## 2016-08-16 MED ORDER — MIRABEGRON ER 25 MG PO TB24: 25.0000 mg | ORAL_TABLET | Freq: Every day | ORAL | 1 refills | Status: DC

## 2016-08-16 MED ORDER — DONEPEZIL HCL 10 MG PO TABS: 10.0000 mg | ORAL_TABLET | Freq: Two times a day (BID) | ORAL | 12 refills | Status: DC

## 2016-08-16 MED ORDER — SIMVASTATIN 10 MG PO TABS: 10.0000 mg | ORAL_TABLET | Freq: Every day | ORAL | 1 refills | Status: DC

## 2016-08-16 MED ORDER — GABAPENTIN 800 MG PO TABS: 800.0000 mg | ORAL_TABLET | Freq: Three times a day (TID) | ORAL | 2 refills | Status: DC

## 2016-08-16 MED ORDER — SOLIFENACIN SUCCINATE 10 MG PO TABS: 10.0000 mg | ORAL_TABLET | Freq: Every day | ORAL | 1 refills | Status: DC

## 2016-08-16 MED ORDER — FLUTICASONE PROPIONATE 50 MCG/ACT NA SUSP: 2.0000 | Freq: Every day | NASAL | 2 refills | Status: AC

## 2016-08-16 MED ORDER — LOSARTAN POTASSIUM 50 MG PO TABS: 50.0000 mg | ORAL_TABLET | Freq: Every day | ORAL | 1 refills | Status: DC

## 2016-08-16 MED ORDER — AMLODIPINE BESYLATE 10 MG PO TABS: 10.0000 mg | ORAL_TABLET | Freq: Every day | ORAL | 2 refills | Status: AC

## 2016-08-16 MED ORDER — SERTRALINE HCL 50 MG PO TABS: 50.0000 mg | ORAL_TABLET | Freq: Every day | ORAL | 0 refills | Status: DC

## 2016-08-17 ENCOUNTER — Telehealth: Payer: Self-pay

## 2016-08-17 NOTE — Telephone Encounter (Signed)
A fax was received from [facility] stating that patient had concerns over dosage of medication. Patient told pharmacy that she had been on losartan 100 mg every day prior to visit with provider. Patient stated that she was under the impression that the losartan was increased however, a prescription was sent to pharmacy for losartan 50 mg only.    Please verify the correct dosage of losartan that the patient is supposed to be taking.

## 2016-08-18 ENCOUNTER — Telehealth: Payer: Self-pay | Admitting: *Deleted

## 2016-08-18 NOTE — Telephone Encounter (Signed)
Patient called upset that her rx for losartan has not been clarified. Patient was informed that we sent a message to [facility] yesterday and are awaiting response.   I will contact [facility] via cell phone and request that she speak directly with [facility] to clarify RX, send new rx to the pharmacy if needed and follow up with patient

## 2016-08-18 NOTE — Telephone Encounter (Signed)
Spoke with patient regarding her medication, she stated that she was told by [facility] to take 200 mg of losartan. I told her that I spoke with [facility] and she would like to see her in the clinic to clarify the medications and appointment was scheduled for 08/19/2016@1 :30pm.

## 2016-08-19 ENCOUNTER — Non-Acute Institutional Stay: Payer: Medicare Other | Admitting: Nurse Practitioner

## 2016-08-19 ENCOUNTER — Encounter: Payer: Self-pay | Admitting: Nurse Practitioner

## 2016-08-19 ENCOUNTER — Encounter: Payer: Self-pay | Admitting: Internal Medicine

## 2016-08-19 VITALS — BP 140/80 | HR 103 | Temp 98.5°F | Resp 20 | Ht 68.0 in | Wt 187.6 lb

## 2016-08-19 DIAGNOSIS — G4700 Insomnia, unspecified: Secondary | ICD-10-CM | POA: Diagnosis not present

## 2016-08-19 DIAGNOSIS — R413 Other amnesia: Secondary | ICD-10-CM | POA: Diagnosis not present

## 2016-08-19 DIAGNOSIS — E876 Hypokalemia: Secondary | ICD-10-CM | POA: Diagnosis not present

## 2016-08-19 DIAGNOSIS — F329 Major depressive disorder, single episode, unspecified: Secondary | ICD-10-CM

## 2016-08-19 DIAGNOSIS — D696 Thrombocytopenia, unspecified: Secondary | ICD-10-CM

## 2016-08-19 DIAGNOSIS — M1990 Unspecified osteoarthritis, unspecified site: Secondary | ICD-10-CM | POA: Diagnosis not present

## 2016-08-19 DIAGNOSIS — N3946 Mixed incontinence: Secondary | ICD-10-CM | POA: Diagnosis not present

## 2016-08-19 DIAGNOSIS — I10 Essential (primary) hypertension: Secondary | ICD-10-CM

## 2016-08-19 DIAGNOSIS — F32A Depression, unspecified: Secondary | ICD-10-CM

## 2016-08-19 LAB — CBC AND DIFFERENTIAL
HCT: 46 % (ref 36–46)
Hemoglobin: 15.7 g/dL (ref 12.0–16.0)
Platelets: 225 10*3/uL (ref 150–399)
WBC: 6.2 10^3/mL

## 2016-08-19 LAB — HEPATIC FUNCTION PANEL
ALT: 11 U/L (ref 7–35)
AST: 16 U/L (ref 13–35)
Alkaline Phosphatase: 62 U/L (ref 25–125)
Bilirubin, Total: 0.6 mg/dL

## 2016-08-19 LAB — BASIC METABOLIC PANEL
BUN: 12 mg/dL (ref 4–21)
CREATININE: 0.7 mg/dL (ref <=1.1)
GLUCOSE: 108 mg/dL
Potassium: 42 mmol/L — AB (ref 3.4–5.3)
SODIUM: 139 mmol/L (ref 137–147)

## 2016-08-19 MED ORDER — METOPROLOL SUCCINATE ER 25 MG PO TB24: 25.0000 mg | ORAL_TABLET | Freq: Every day | ORAL | 3 refills | Status: DC

## 2016-08-19 MED ORDER — LOSARTAN POTASSIUM 100 MG PO TABS: 100.0000 mg | ORAL_TABLET | Freq: Every day | ORAL | 3 refills | Status: DC

## 2016-08-19 NOTE — Assessment & Plan Note (Signed)
Pain is managed with Neurontin and Tylenol. 

## 2016-08-19 NOTE — Assessment & Plan Note (Signed)
Stable. Continue Sertraline 50mg 

## 2016-08-19 NOTE — Assessment & Plan Note (Signed)
08/18/16 Na 139, K 4.2, Bun 12, creat 0.73

## 2016-08-19 NOTE — Assessment & Plan Note (Addendum)
Controlled. Continue Amlodipine 10mg , Losartan 100mg  daily. May add Metoprolol 25mg  qd if BP>140/90

## 2016-08-19 NOTE — Assessment & Plan Note (Signed)
Not sleeping well. Continue Ambien 

## 2016-08-19 NOTE — Progress Notes (Signed)
Provider:  Marlana Latus NP Location:   [facility]   Place of Service:  Clinic ([id number])  PCP: Jeanmarie Hubert, MD Patient Care Team: Estill Dooms, MD as PCP - General (Internal Medicine) Bo Merino, MD as Consulting Physician (Rheumatology) Juanita Craver, MD as Consulting Physician (Gastroenterology) Vickey Huger, MD as Consulting Physician (Orthopedic Surgery) Calvert Cantor, MD as Consulting Physician (Ophthalmology) Man Otho Darner, NP as Nurse Practitioner (Internal Medicine)  Extended Emergency Contact Information Primary Emergency Contact: Gruendler,Joseph Address: [address], [place] Phone: [phone] Mobile Phone: [phone] Relation: Spouse  Code Status: DNR Goals of Care: Advanced Directive information Advanced Directives 08/19/2016  Does Patient Have a Medical Advance Directive? Yes  Type of Advance Directive Living will  Does patient want to make changes to medical advance directive? No - Patient declined  Copy of [place] in Chart? -  Pre-existing out of facility DNR order (yellow form or pink MOST form) -      Chief Complaint  Patient presents with  . Acute Visit    BP elevated, medications questions    HPI: Patient is a 83 y.o. female seen today for blood pressure.   Hx of HTN, not controlled, taking Amlodipine 10mg , Losartan 100mg . 08/19/16 wbc 6.2, Hgb 15.7, plt 225, Na 139, K 4.2, Bun 12, creat 0.73.  Insomnia Ambien 10mg , depression stable on Sertraline 50mg , incontinent 2-3 x/night, f/u Urology, taking Vesicare and Myrbetriq, Memory lapses, taking Aricept, neuropathy, her neurologists passed away and retired. Taking Gabapentin. Fibromyalgia taking Tylenol 2 tabs at 4pm, Ibuprofen bid.   Past Medical History:  Diagnosis Date  . Cataract   . Fibromyalgia 04/25/2014  . Hyperlipidemia   . Hypertension   . Lesion of sciatic nerve   . Low back pain   . Memory loss   . Spinal stenosis of lumbar  region 04/25/2014  . Tremor   . Weakness    Past Surgical History:  Procedure Laterality Date  . ABDOMINAL HYSTERECTOMY  1963  . APPENDECTOMY    . CATARACT EXTRACTION W/ INTRAOCULAR LENS  IMPLANT, BILATERAL  2005 and 2007  . COLON SURGERY  1974   colon polyp  . COLON SURGERY  2002   colon polyps(2)  Dr Collene Mares  . COLONOSCOPY W/ POLYPECTOMY  1974and 2002  . EYE SURGERY  2005/2007   cataract renoval  Dr. Bing Plume  . KNEE SURGERY  2009   Dr. Ronnie Derby  . LUMBAR LAMINECTOMY  2012   L4-5 with spinous process fixation (06/22/11) Dr. Tommi Emery  . schwanoma  1987   residual right foot drop and right leg pain. Dr. Deirdre Pippins  . SMALL INTESTINE SURGERY  1990   bowel obstruction, Dr. Rebekah Chesterfield  . TONSILLECTOMY  1940    reports that she quit smoking about 42 years ago. She has never used smokeless tobacco. She reports that she drinks about 1.2 oz of alcohol per week . She reports that she does not use drugs. Social History   Social History  . Marital status: Married    Spouse name: N/A  . Number of children: 4  . Years of education: N/A   Occupational History  . retired Public relations account executive  Retired    retired   Social History Main Topics  . Smoking status: Former Smoker    Quit date: 04/25/1974  . Smokeless tobacco: Never Used     Comment: Quit thirty years ago.  . Alcohol use 1.2  oz/week    2 Glasses of wine per week     Comment: 2 glasses of wine daily  . Drug use: No  . Sexual activity: Not on file   Other Topics Concern  . Not on file   Social History Narrative   Patient is retired Public relations account executive and lives with her husband, (married since 1980) at [place] since 2013   Patient  is right handed. She drinks 1 cup of coffee per day.    She does not have any pets.   Former smoker for 20 years   Exercise none                          Functional Status Survey:    Family History  Problem Relation Age of Onset  . Breast cancer Mother   . Heart disease Mother   . Dementia Father    . Cancer Sister     Esophageal     Health Maintenance  Topic Date Due  . TETANUS/TDAP  08/20/1949  . ZOSTAVAX  08/20/1990  . DEXA SCAN  08/21/1995  . PNA vac Low Risk Adult (1 of 2 - PCV13) 08/21/1995  . INFLUENZA VACCINE  Completed    Allergies  Allergen Reactions  . Restoril [Temazepam]     Nightmares    Allergies as of 08/19/2016      Reactions   Restoril [temazepam]    Nightmares      Medication List       Accurate as of 08/19/16  2:22 PM. Always use your most recent med list.          acetaminophen 500 MG tablet Commonly known as:  TYLENOL Take 500 mg by mouth. Take 2 tablets at night   acidophilus Caps capsule Take 1 capsule by mouth daily.   amLODipine 10 MG tablet Commonly known as:  NORVASC Take 1 tablet (10 mg total) by mouth daily.   aspirin 81 MG chewable tablet Chew 81 mg by mouth daily.   cholecalciferol 1000 units tablet Commonly known as:  VITAMIN D Take by mouth daily. Take 2,000 units daily   donepezil 10 MG tablet Commonly known as:  ARICEPT Take 1 tablet (10 mg total) by mouth 2 (two) times daily.   fluticasone 50 MCG/ACT nasal spray Commonly known as:  FLONASE Place 2 sprays into both nostrils daily. 2 sprays in each nostril once daily   gabapentin 800 MG tablet Commonly known as:  NEURONTIN Take 1 tablet (800 mg total) by mouth 3 (three) times daily.   losartan 50 MG tablet Commonly known as:  COZAAR Take 1 tablet (50 mg total) by mouth daily.   Magnesium 250 MG Tabs Take by mouth. Take one tablet daily   mirabegron ER 25 MG Tb24 tablet Commonly known as:  MYRBETRIQ Take 1 tablet (25 mg total) by mouth daily.   multivitamin with minerals Tabs tablet Take 1 tablet by mouth daily.   sertraline 50 MG tablet Commonly known as:  ZOLOFT Take 1 tablet (50 mg total) by mouth daily.   simvastatin 10 MG tablet Commonly known as:  ZOCOR Take 1 tablet (10 mg total) by mouth at bedtime.   solifenacin 10 MG tablet Commonly  known as:  VESICARE Take 1 tablet (10 mg total) by mouth daily.   zolpidem 10 MG tablet Commonly known as:  AMBIEN TAKE 1 TABLET AT BEDTIME AS NEEDED.       Review of Systems  Constitutional:  Positive for fatigue. Negative for activity change, appetite change, chills, diaphoresis, fever and unexpected weight change.  HENT: Negative for congestion, ear discharge, ear pain, hearing loss, postnasal drip, rhinorrhea, sore throat, tinnitus, trouble swallowing and voice change.   Eyes: Negative for pain, redness, itching and visual disturbance.  Respiratory: Negative for cough, choking, shortness of breath and wheezing.   Cardiovascular: Negative for chest pain, palpitations and leg swelling.  Gastrointestinal: Negative for abdominal distention, abdominal pain, constipation, diarrhea and nausea.  Endocrine: Negative for cold intolerance, heat intolerance, polydipsia, polyphagia and polyuria.  Genitourinary: Positive for frequency. Negative for difficulty urinating, dysuria, flank pain, hematuria, pelvic pain, urgency and vaginal discharge.  Musculoskeletal: Positive for gait problem (cane). Negative for arthralgias, back pain, myalgias, neck pain and neck stiffness.       Right foot drop  Skin: Negative for color change, pallor and rash.  Allergic/Immunologic: Negative.   Neurological: Negative for dizziness, tremors, seizures, syncope, weakness, numbness and headaches.       Lower body weakness, ambulates with walker  Hematological: Negative for adenopathy. Does not bruise/bleed easily.  Psychiatric/Behavioral: Positive for sleep disturbance. Negative for agitation, behavioral problems, confusion, dysphoric mood, hallucinations and suicidal ideas. The patient is nervous/anxious. The patient is not hyperactive.     Vitals:   08/19/16 1331  BP: 140/80  Pulse: (!) 103  Resp: 20  Temp: 98.5 F (36.9 C)  SpO2: 95%  Weight: 187 lb 9.3 oz (85.1 kg)  Height: 5\' 8"  (1.727 m)   Body mass  index is 28.52 kg/m. Physical Exam  Constitutional: She is oriented to person, place, and time. She appears well-developed and well-nourished. No distress.  HENT:  Right Ear: External ear normal.  Left Ear: External ear normal.  Nose: Nose normal.  Mouth/Throat: Oropharynx is clear and moist. No oropharyngeal exudate.  Eyes: Conjunctivae and EOM are normal. Pupils are equal, round, and reactive to light. No scleral icterus.  Neck: No JVD present. No tracheal deviation present. No thyromegaly present.  Cardiovascular: Normal rate, regular rhythm, normal heart sounds and intact distal pulses.  Exam reveals no gallop and no friction rub.   No murmur heard. Pulmonary/Chest: Effort normal. No respiratory distress. She has no wheezes. She has no rales. She exhibits no tenderness.  Abdominal: She exhibits no distension and no mass. There is no tenderness.  Musculoskeletal: Normal range of motion. She exhibits no edema or tenderness.  RIGHT afo FOR FOOT DROP  Lymphadenopathy:    She has no cervical adenopathy.  Neurological: She is alert and oriented to person, place, and time. No cranial nerve deficit. Coordination normal.  Right foot drop, brace  Skin: No rash noted. She is not diaphoretic. No erythema. No pallor.  Psychiatric: She has a normal mood and affect. Her behavior is normal. Judgment and thought content normal.    Labs reviewed: Basic Metabolic Panel: No results for input(s): NA, K, CL, CO2, GLUCOSE, BUN, CREATININE, CALCIUM, MG, PHOS in the last 8760 hours. Liver Function Tests: No results for input(s): AST, ALT, ALKPHOS, BILITOT, PROT, ALBUMIN in the last 8760 hours. No results for input(s): LIPASE, AMYLASE in the last 8760 hours. No results for input(s): AMMONIA in the last 8760 hours. CBC: No results for input(s): WBC, NEUTROABS, HGB, HCT, MCV, PLT in the last 8760 hours. Cardiac Enzymes: No results for input(s): CKTOTAL, CKMB, CKMBINDEX, TROPONINI in the last 8760  hours. BNP: Invalid input(s): POCBNP No results found for: HGBA1C Lab Results  Component Value Date   TSH 1.81 02/18/2014  No results found for: VITAMINB12 No results found for: FOLATE No results found for: IRON, TIBC, FERRITIN  Imaging and Procedures obtained prior to SNF admission: Dg Hip Complete Right  Result Date: 04/13/2012 *RADIOLOGY REPORT* Clinical Data: Fall.  Right hip pain. RIGHT HIP - COMPLETE 2+ VIEW Comparison: With Findings: The patient has acute or right superior and inferior pubic rami fractures.  No other fracture is identified.  Left worse than right hip osteoarthritis is seen.  Postoperative change lower lumbar spine and right pelvis noted. IMPRESSION: 1.  Acute right superior and inferior pubic rami fractures. 2.  Left worse than right hip osteoarthritis. Original Report Authenticated By: Arvid Right. Luther Parody, M.D.    Assessment/Plan  Essential hypertension Controlled. Continue Amlodipine 10mg , Losartan 100mg  daily. May add Metoprolol 25mg  bid if BP>140/90  Osteoarthritis Pain is managed with Neurontin and Tylenol.   Hypokalemia 08/18/16 Na 139, K 4.2, Bun 12, creat 0.73  Memory deficit Memory lapses, continue Aricept, update MMSE  Depression Stable. Continue Sertraline 50mg   Insomnia Not sleeping well. Continue Ambien  Urine incontinence Continue taking Vesicare and Myrbetriq,  Family/ staff Communication: IL  Labs/tests ordered: TSH Hgb a1c, lipid panel, UA with next lab prior to next appointment.

## 2016-08-19 NOTE — Assessment & Plan Note (Signed)
Continue taking Vesicare and Myrbetriq,

## 2016-08-19 NOTE — Assessment & Plan Note (Signed)
Memory lapses, continue Aricept, update MMSE

## 2016-08-24 NOTE — Telephone Encounter (Signed)
Called patient regarding her medication changes and to see if she had purchased the new blood pressure cuff that we spoke about in clinic. I will try to return the call again later, if not today then tomorrow.

## 2016-09-12 ENCOUNTER — Emergency Department (HOSPITAL_COMMUNITY)
Admission: EM | Admit: 2016-09-12 | Discharge: 2016-09-12 | Disposition: A | Discharge status: Home / Self Care | Payer: Medicare Other | Source: Home / Self Care | Attending: Emergency Medicine | Admitting: Emergency Medicine

## 2016-09-12 ENCOUNTER — Encounter (HOSPITAL_COMMUNITY): Payer: Self-pay | Admitting: Nurse Practitioner

## 2016-09-12 ENCOUNTER — Emergency Department (HOSPITAL_COMMUNITY): Payer: Medicare Other

## 2016-09-12 DIAGNOSIS — Z7982 Long term (current) use of aspirin: Secondary | ICD-10-CM | POA: Insufficient documentation

## 2016-09-12 DIAGNOSIS — R7881 Bacteremia: Secondary | ICD-10-CM | POA: Diagnosis not present

## 2016-09-12 DIAGNOSIS — I10 Essential (primary) hypertension: Secondary | ICD-10-CM

## 2016-09-12 DIAGNOSIS — N390 Urinary tract infection, site not specified: Secondary | ICD-10-CM

## 2016-09-12 DIAGNOSIS — Z87891 Personal history of nicotine dependence: Secondary | ICD-10-CM

## 2016-09-12 DIAGNOSIS — Z79899 Other long term (current) drug therapy: Secondary | ICD-10-CM

## 2016-09-12 LAB — URINALYSIS, ROUTINE W REFLEX MICROSCOPIC
Bilirubin Urine: NEGATIVE
GLUCOSE, UA: NEGATIVE mg/dL
Ketones, ur: NEGATIVE mg/dL
Nitrite: POSITIVE — AB
PH: 6 (ref 5.0–8.0)
Protein, ur: NEGATIVE mg/dL
SPECIFIC GRAVITY, URINE: 1.006 (ref 1.005–1.030)
Squamous Epithelial / HPF: NONE SEEN

## 2016-09-12 LAB — CBC WITH DIFFERENTIAL/PLATELET
BASOS ABS: 0 10*3/uL (ref 0.0–0.1)
Basophils Relative: 0 %
EOS ABS: 0 10*3/uL (ref 0.0–0.7)
EOS PCT: 0 %
HCT: 43.4 % (ref 36.0–46.0)
Hemoglobin: 14.5 g/dL (ref 12.0–15.0)
Lymphocytes Relative: 8 %
Lymphs Abs: 0.8 10*3/uL (ref 0.7–4.0)
MCH: 30.7 pg (ref 26.0–34.0)
MCHC: 33.4 g/dL (ref 30.0–36.0)
MCV: 91.9 fL (ref 78.0–100.0)
MONO ABS: 1.3 10*3/uL — AB (ref 0.1–1.0)
Monocytes Relative: 13 %
Neutro Abs: 7.9 10*3/uL — ABNORMAL HIGH (ref 1.7–7.7)
Neutrophils Relative %: 79 %
PLATELETS: 160 10*3/uL (ref 150–400)
RBC: 4.72 MIL/uL (ref 3.87–5.11)
RDW: 14 % (ref 11.5–15.5)
WBC: 10 10*3/uL (ref 4.0–10.5)

## 2016-09-12 LAB — I-STAT CG4 LACTIC ACID, ED
Lactic Acid, Venous: 1.72 mmol/L (ref 0.5–1.9)
Lactic Acid, Venous: 0.89 mmol/L (ref 0.5–1.9)

## 2016-09-12 LAB — COMPREHENSIVE METABOLIC PANEL
ALBUMIN: 3.6 g/dL (ref 3.5–5.0)
ALK PHOS: 57 U/L (ref 38–126)
ALT: 15 U/L (ref 14–54)
AST: 21 U/L (ref 15–41)
Anion gap: 7 (ref 5–15)
BILIRUBIN TOTAL: 0.9 mg/dL (ref 0.3–1.2)
BUN: 11 mg/dL (ref 6–20)
CALCIUM: 9.1 mg/dL (ref 8.9–10.3)
CO2: 28 mmol/L (ref 22–32)
CREATININE: 0.63 mg/dL (ref 0.44–1.00)
Chloride: 104 mmol/L (ref 101–111)
GFR calc Af Amer: >60 mL/min (ref >60)
GLUCOSE: 125 mg/dL — AB (ref 65–99)
Potassium: 3.6 mmol/L (ref 3.5–5.1)
Sodium: 139 mmol/L (ref 135–145)
TOTAL PROTEIN: 6.5 g/dL (ref 6.5–8.1)

## 2016-09-12 MED ORDER — CIPROFLOXACIN HCL 500 MG PO TABS: 500.0000 mg | ORAL_TABLET | Freq: Two times a day (BID) | ORAL | 0 refills | Status: DC

## 2016-09-12 MED ORDER — SODIUM CHLORIDE 0.9 % IV BOLUS (SEPSIS)
1000.0000 mL | Freq: Once | INTRAVENOUS | Status: AC
  Administered 2016-09-12: 1000 mL via INTRAVENOUS

## 2016-09-12 NOTE — ED Triage Notes (Signed)
Patient stated she woke up feeling weak. Patient was treated 2 weeks ago with a UTI. Patient finished course of antibiotics but feels she still has a UTI and thinks that could be way she still feels weak. Temp 101. Tylenol given 1000mg 

## 2016-09-12 NOTE — ED Notes (Signed)
Patient transported to X-ray 

## 2016-09-12 NOTE — ED Notes (Signed)
Bed: WA07 Expected date:  Expected time:  Means of arrival:  Comments: EMS-fever 

## 2016-09-12 NOTE — ED Provider Notes (Signed)
[facility] DEPT Provider Note   CSN: [id number] Arrival date & time: 09/12/16  [id number]     History   Chief Complaint Chief Complaint  Patient presents with  . Weakness  . Recurrent UTI    HPI Kristie [name] is a 83 y.o. female.  83 yo F with a chief complaint of weakness. Patient states is when she woke up and felt generally weak and thought she was unable to get out of bed. Is having some fevers and chills as well. Prior to today she felt at her baseline. Denies cough congestion dysuria or increased frequency or hesitancy. Denies abdominal pain. Denies vomiting or diarrhea. Denies chest pain or shortness of breath.   The history is provided by the patient.  Weakness  Primary symptoms include no dizziness. Pertinent negatives include no shortness of breath, no chest pain, no vomiting and no headaches.  Illness  This is a new problem. The current episode started 3 to 5 hours ago. The problem occurs constantly. The problem has been gradually improving. Pertinent negatives include no chest pain, no headaches and no shortness of breath. Nothing aggravates the symptoms.    Past Medical History:  Diagnosis Date  . Cataract   . Fibromyalgia 04/25/2014  . Hyperlipidemia   . Hypertension   . Lesion of sciatic nerve   . Low back pain   . Memory loss   . Spinal stenosis of lumbar region 04/25/2014  . Tremor   . Weakness     Patient Active Problem List   Diagnosis Date Noted  . Depression 08/05/2016  . Memory deficit 08/05/2016  . Spinal stenosis of lumbar region 04/25/2014  . Fibromyalgia 04/25/2014  . Urine incontinence 04/25/2014  . Senile osteoporosis 04/25/2014  . Insomnia 04/25/2014  . Right foot drop 04/27/2013  . Abnormality of gait 04/27/2013  . Weakness   . Nausea 04/15/2012  . Hyperlipidemia 04/14/2012  . Pubic bone fracture ([place]) 04/14/2012  . Hypokalemia 04/14/2012  . Essential hypertension 08/18/2007  . Osteoarthritis 08/18/2007  . Foot drop, right foot  08/18/2007  . OTHER ACQUIRED DEFORMITY OF ANKLE AND FOOT OTHER 08/18/2007  . UNEQUAL LEG LENGTH 08/18/2007  . COLONIC POLYPS, HX OF 08/18/2007    Past Surgical History:  Procedure Laterality Date  . ABDOMINAL HYSTERECTOMY  1963  . APPENDECTOMY    . CATARACT EXTRACTION W/ INTRAOCULAR LENS  IMPLANT, BILATERAL  2005 and 2007  . COLON SURGERY  1974   colon polyp  . COLON SURGERY  2002   colon polyps(2)  Dr Collene Mares  . COLONOSCOPY W/ POLYPECTOMY  1974and 2002  . EYE SURGERY  2005/2007   cataract renoval  Dr. Bing Plume  . KNEE SURGERY  2009   Dr. Ronnie Derby  . LUMBAR LAMINECTOMY  2012   L4-5 with spinous process fixation (06/22/11) Dr. Tommi Emery  . schwanoma  1987   residual right foot drop and right leg pain. Dr. Deirdre Pippins  . SMALL INTESTINE SURGERY  1990   bowel obstruction, Dr. Rebekah Chesterfield  . TONSILLECTOMY  1940    OB History    No data available       Home Medications    Prior to Admission medications   Medication Sig Start Date End Date Taking? Authorizing Provider  acetaminophen (TYLENOL) 500 MG tablet Take 500 mg by mouth. Take 2 tablets at night    Historical Provider, MD  acidophilus (RISAQUAD) CAPS Take 1 capsule by mouth daily.    Historical Provider, MD  amLODipine (NORVASC) 10 MG tablet Take  1 tablet (10 mg total) by mouth daily. 08/16/16   Man X Mast, NP  aspirin 81 MG chewable tablet Chew 81 mg by mouth daily.    Historical Provider, MD  cholecalciferol (VITAMIN D) 1000 UNITS tablet Take by mouth daily. Take 2,000 units daily    Historical Provider, MD  ciprofloxacin (CIPRO) 500 MG tablet Take 1 tablet (500 mg total) by mouth 2 (two) times daily. One po bid x 7 days 09/12/16   Deno Etienne, DO  donepezil (ARICEPT) 10 MG tablet Take 1 tablet (10 mg total) by mouth 2 (two) times daily. 08/16/16   Man X Mast, NP  fluticasone (FLONASE) 50 MCG/ACT nasal spray Place 2 sprays into both nostrils daily. 2 sprays in each nostril once daily 08/16/16   Man X Mast, NP  gabapentin (NEURONTIN) 800 MG  tablet Take 1 tablet (800 mg total) by mouth 3 (three) times daily. 08/16/16   Man X Mast, NP  losartan (COZAAR) 100 MG tablet Take 1 tablet (100 mg total) by mouth daily. 08/19/16   Man X Mast, NP  losartan (COZAAR) 50 MG tablet Take 1 tablet (50 mg total) by mouth daily. 08/16/16   Man X Mast, NP  Magnesium 250 MG TABS Take by mouth. Take one tablet daily    Historical Provider, MD  metoprolol succinate (TOPROL-XL) 25 MG 24 hr tablet Take 1 tablet (25 mg total) by mouth daily. Start with one tablet if blood pressure is 140/90. 08/19/16   Man X Mast, NP  mirabegron ER (MYRBETRIQ) 25 MG TB24 tablet Take 1 tablet (25 mg total) by mouth daily. 08/16/16   Man X Mast, NP  Multiple Vitamin (MULTIVITAMIN WITH MINERALS) TABS Take 1 tablet by mouth daily.    Historical Provider, MD  sertraline (ZOLOFT) 50 MG tablet Take 1 tablet (50 mg total) by mouth daily. 08/16/16   Man X Mast, NP  simvastatin (ZOCOR) 10 MG tablet Take 1 tablet (10 mg total) by mouth at bedtime. 08/16/16   Man X Mast, NP  solifenacin (VESICARE) 10 MG tablet Take 1 tablet (10 mg total) by mouth daily. 08/16/16   Man X Mast, NP  zolpidem (AMBIEN) 10 MG tablet TAKE 1 TABLET AT BEDTIME AS NEEDED. 07/01/16   Eliezer Lofts, PA-C    Family History Family History  Problem Relation Age of Onset  . Breast cancer Mother   . Heart disease Mother   . Dementia Father   . Cancer Sister     Esophageal     Social History Social History  Substance Use Topics  . Smoking status: Former Smoker    Quit date: 04/25/1974  . Smokeless tobacco: Never Used     Comment: Quit thirty years ago.  . Alcohol use 1.2 oz/week    2 Glasses of wine per week     Comment: 2 glasses of wine daily     Allergies   Restoril [temazepam]   Review of Systems Review of Systems  Constitutional: Positive for chills and fever.  HENT: Negative for congestion and rhinorrhea.   Eyes: Negative for redness and visual disturbance.  Respiratory: Negative for shortness of  breath and wheezing.   Cardiovascular: Negative for chest pain and palpitations.  Gastrointestinal: Negative for nausea and vomiting.  Genitourinary: Negative for dysuria and urgency.  Musculoskeletal: Negative for arthralgias and myalgias.  Skin: Negative for pallor and wound.  Neurological: Positive for weakness. Negative for dizziness and headaches.     Physical Exam Updated Vital Signs BP 125/82  Pulse 88   Temp 98.8 F (37.1 C)   Resp 18   SpO2 98%   Physical Exam  Constitutional: She is oriented to person, place, and time. She appears well-developed and well-nourished. No distress.  HENT:  Head: Normocephalic and atraumatic.  Swollen turbinates, posterior nasal drip, no noted sinus ttp, tm normal bilaterally.    Eyes: EOM are normal. Pupils are equal, round, and reactive to light.  Neck: Normal range of motion. Neck supple.  Cardiovascular: Normal rate and regular rhythm.  Exam reveals no gallop and no friction rub.   No murmur heard. Pulmonary/Chest: Effort normal. She has no wheezes. She has no rales.  Abdominal: Soft. She exhibits no distension and no mass. There is no tenderness. There is no guarding.  Musculoskeletal: She exhibits no edema or tenderness.  Neurological: She is alert and oriented to person, place, and time.  Skin: Skin is warm and dry. She is not diaphoretic.  Psychiatric: She has a normal mood and affect. Her behavior is normal.  Nursing note and vitals reviewed.    ED Treatments / Results  Labs (all labs ordered are listed, but only abnormal results are displayed) Labs Reviewed  COMPREHENSIVE METABOLIC PANEL - Abnormal; Notable for the following:       Result Value   Glucose, Bld 125 (*)    All other components within normal limits  CBC WITH DIFFERENTIAL/PLATELET - Abnormal; Notable for the following:    Neutro Abs 7.9 (*)    Monocytes Absolute 1.3 (*)    All other components within normal limits  URINALYSIS, ROUTINE W REFLEX MICROSCOPIC  - Abnormal; Notable for the following:    APPearance HAZY (*)    Hgb urine dipstick SMALL (*)    Nitrite POSITIVE (*)    Leukocytes, UA LARGE (*)    Bacteria, UA MANY (*)    All other components within normal limits  CULTURE, BLOOD (ROUTINE X 2)  CULTURE, BLOOD (ROUTINE X 2)  URINE CULTURE  I-STAT CG4 LACTIC ACID, ED  I-STAT CG4 LACTIC ACID, ED    EKG  EKG Interpretation  Date/Time:  Sunday September 12 2016 09:24:50 EST Ventricular Rate:  89 PR Interval:    QRS Duration: 89 QT Interval:  401 QTC Calculation: 488 R Axis:   -18 Text Interpretation:  Sinus rhythm Borderline left axis deviation Posterior infarct, old Baseline wander in lead(s) V5 TECHNICALLY DIFFICULT Otherwise no significant change Confirmed by Tyrone Nine MD, DANIEL [phone]) on 09/12/2016 9:56:24 AM       Radiology Dg Chest 2 View  Result Date: 09/12/2016 CLINICAL DATA:  Weakness for 2 days. Ex-smoker with history of hypertension. EXAM: CHEST  2 VIEW COMPARISON:  Portable chest 05/05/2010. FINDINGS: The heart size and mediastinal contours are stable. There is mild aortic tortuosity. The lungs are clear. There is no pleural effusion or pneumothorax. No acute osseous findings are evident. Telemetry leads overlie the chest. IMPRESSION: Stable chest.  No active cardiopulmonary process. Electronically Signed   By: Richardean Sale M.D.   On: 09/12/2016 10:17    Procedures Procedures (including critical care time)  Medications Ordered in ED Medications  sodium chloride 0.9 % bolus 1,000 mL (0 mLs Intravenous Stopped 09/12/16 1141)     Initial Impression / Assessment and Plan / ED Course  I have reviewed the triage vital signs and the nursing notes.  Pertinent labs & imaging results that were available during my care of the patient were reviewed by me and considered in my medical decision making (  see chart for details).     83 yo F with a chief complaint of a fever and feeling unwell. Clinically is dehydrated we'll  give fluids. Check labs chest x-ray UA.  UA with recurrent UTI.  Ambulates without difficulty.  D/c home.   3:15 PM:  I have discussed the diagnosis/risks/treatment options with the patient and family and believe the pt to be eligible for discharge home to follow-up with PCP. We also discussed returning to the ED immediately if new or worsening sx occur. We discussed the sx which are most concerning (e.g., sudden worsening pain, fever, inability to tolerate by mouth) that necessitate immediate return. Medications administered to the patient during their visit and any new prescriptions provided to the patient are listed below.  Medications given during this visit Medications  sodium chloride 0.9 % bolus 1,000 mL (0 mLs Intravenous Stopped 09/12/16 1141)     The patient appears reasonably screen and/or stabilized for discharge and I doubt any other medical condition or other [facility] requiring further screening, evaluation, or treatment in the ED at this time prior to discharge.    Final Clinical Impressions(s) / ED Diagnoses   Final diagnoses:  Lower urinary tract infectious disease    New Prescriptions Discharge Medication List as of 09/12/2016  1:11 PM    START taking these medications   Details  ciprofloxacin (CIPRO) 500 MG tablet Take 1 tablet (500 mg total) by mouth 2 (two) times daily. One po bid x 7 days, Starting Sun 09/12/2016, [place], DO 09/12/16 1515

## 2016-09-12 NOTE — Discharge Instructions (Signed)
Follow-up with your urologist.  

## 2016-09-13 ENCOUNTER — Encounter (HOSPITAL_COMMUNITY): Payer: Self-pay | Admitting: Nurse Practitioner

## 2016-09-13 ENCOUNTER — Inpatient Hospital Stay (HOSPITAL_COMMUNITY)
Admission: EM | Admit: 2016-09-13 | Discharge: 2016-09-16 | DRG: 872 | Disposition: A | Discharge status: Home Health | Payer: Medicare Other | Attending: Internal Medicine | Admitting: Internal Medicine

## 2016-09-13 ENCOUNTER — Telehealth (HOSPITAL_BASED_OUTPATIENT_CLINIC_OR_DEPARTMENT_OTHER): Payer: Self-pay | Admitting: *Deleted

## 2016-09-13 DIAGNOSIS — Z981 Arthrodesis status: Secondary | ICD-10-CM

## 2016-09-13 DIAGNOSIS — R000 Tachycardia, unspecified: Secondary | ICD-10-CM | POA: Diagnosis present

## 2016-09-13 DIAGNOSIS — Z86010 Personal history of colonic polyps: Secondary | ICD-10-CM

## 2016-09-13 DIAGNOSIS — E784 Other hyperlipidemia: Secondary | ICD-10-CM

## 2016-09-13 DIAGNOSIS — B9620 Unspecified Escherichia coli [E. coli] as the cause of diseases classified elsewhere: Secondary | ICD-10-CM | POA: Diagnosis present

## 2016-09-13 DIAGNOSIS — Z8249 Family history of ischemic heart disease and other diseases of the circulatory system: Secondary | ICD-10-CM

## 2016-09-13 DIAGNOSIS — W1830XA Fall on same level, unspecified, initial encounter: Secondary | ICD-10-CM | POA: Diagnosis present

## 2016-09-13 DIAGNOSIS — I10 Essential (primary) hypertension: Secondary | ICD-10-CM | POA: Diagnosis not present

## 2016-09-13 DIAGNOSIS — Z87891 Personal history of nicotine dependence: Secondary | ICD-10-CM

## 2016-09-13 DIAGNOSIS — Z7982 Long term (current) use of aspirin: Secondary | ICD-10-CM

## 2016-09-13 DIAGNOSIS — N390 Urinary tract infection, site not specified: Secondary | ICD-10-CM | POA: Diagnosis not present

## 2016-09-13 DIAGNOSIS — R269 Unspecified abnormalities of gait and mobility: Secondary | ICD-10-CM | POA: Diagnosis not present

## 2016-09-13 DIAGNOSIS — R4189 Other symptoms and signs involving cognitive functions and awareness: Secondary | ICD-10-CM | POA: Diagnosis present

## 2016-09-13 DIAGNOSIS — M797 Fibromyalgia: Secondary | ICD-10-CM | POA: Diagnosis present

## 2016-09-13 DIAGNOSIS — Z79899 Other long term (current) drug therapy: Secondary | ICD-10-CM

## 2016-09-13 DIAGNOSIS — M21371 Foot drop, right foot: Secondary | ICD-10-CM | POA: Diagnosis present

## 2016-09-13 DIAGNOSIS — R7881 Bacteremia: Principal | ICD-10-CM | POA: Diagnosis present

## 2016-09-13 DIAGNOSIS — Z90710 Acquired absence of both cervix and uterus: Secondary | ICD-10-CM

## 2016-09-13 DIAGNOSIS — Z87440 Personal history of urinary (tract) infections: Secondary | ICD-10-CM

## 2016-09-13 DIAGNOSIS — Z888 Allergy status to other drugs, medicaments and biological substances status: Secondary | ICD-10-CM

## 2016-09-13 DIAGNOSIS — R531 Weakness: Secondary | ICD-10-CM | POA: Diagnosis present

## 2016-09-13 DIAGNOSIS — E785 Hyperlipidemia, unspecified: Secondary | ICD-10-CM | POA: Diagnosis present

## 2016-09-13 DIAGNOSIS — Z1624 Resistance to multiple antibiotics: Secondary | ICD-10-CM | POA: Diagnosis present

## 2016-09-13 DIAGNOSIS — D696 Thrombocytopenia, unspecified: Secondary | ICD-10-CM

## 2016-09-13 LAB — BLOOD CULTURE ID PANEL (REFLEXED)
Acinetobacter baumannii: NOT DETECTED
CANDIDA PARAPSILOSIS: NOT DETECTED
CARBAPENEM RESISTANCE: NOT DETECTED
Candida albicans: NOT DETECTED
Candida glabrata: NOT DETECTED
Candida krusei: NOT DETECTED
Candida tropicalis: NOT DETECTED
ENTEROBACTERIACEAE SPECIES: DETECTED — AB
ENTEROCOCCUS SPECIES: NOT DETECTED
Enterobacter cloacae complex: NOT DETECTED
Escherichia coli: DETECTED — AB
HAEMOPHILUS INFLUENZAE: NOT DETECTED
Klebsiella oxytoca: NOT DETECTED
Klebsiella pneumoniae: NOT DETECTED
LISTERIA MONOCYTOGENES: NOT DETECTED
Neisseria meningitidis: NOT DETECTED
PSEUDOMONAS AERUGINOSA: NOT DETECTED
Proteus species: NOT DETECTED
SERRATIA MARCESCENS: NOT DETECTED
STAPHYLOCOCCUS AUREUS BCID: NOT DETECTED
STAPHYLOCOCCUS SPECIES: NOT DETECTED
STREPTOCOCCUS PNEUMONIAE: NOT DETECTED
STREPTOCOCCUS PYOGENES: NOT DETECTED
STREPTOCOCCUS SPECIES: NOT DETECTED
Streptococcus agalactiae: NOT DETECTED

## 2016-09-13 LAB — TSH: TSH: 1.897 u[IU]/mL (ref 0.350–4.500)

## 2016-09-13 LAB — CBC WITH DIFFERENTIAL/PLATELET
Basophils Absolute: 0 10*3/uL (ref 0.0–0.1)
Basophils Relative: 0 %
Eosinophils Absolute: 0 10*3/uL (ref 0.0–0.7)
Eosinophils Relative: 0 %
HEMATOCRIT: 42.1 % (ref 36.0–46.0)
HEMOGLOBIN: 13.9 g/dL (ref 12.0–15.0)
LYMPHS ABS: 0.9 10*3/uL (ref 0.7–4.0)
Lymphocytes Relative: 8 %
MCH: 30.8 pg (ref 26.0–34.0)
MCHC: 33 g/dL (ref 30.0–36.0)
MCV: 93.3 fL (ref 78.0–100.0)
MONO ABS: 1.1 10*3/uL — AB (ref 0.1–1.0)
MONOS PCT: 10 %
NEUTROS ABS: 9.1 10*3/uL — AB (ref 1.7–7.7)
Neutrophils Relative %: 82 %
Platelets: 146 10*3/uL — ABNORMAL LOW (ref 150–400)
RBC: 4.51 MIL/uL (ref 3.87–5.11)
RDW: 14.3 % (ref 11.5–15.5)
WBC: 11.1 10*3/uL — ABNORMAL HIGH (ref 4.0–10.5)

## 2016-09-13 LAB — COMPREHENSIVE METABOLIC PANEL
ALBUMIN: 3.7 g/dL (ref 3.5–5.0)
ALK PHOS: 62 U/L (ref 38–126)
ALT: 21 U/L (ref 14–54)
AST: 23 U/L (ref 15–41)
Anion gap: 7 (ref 5–15)
BILIRUBIN TOTAL: 0.8 mg/dL (ref 0.3–1.2)
BUN: 11 mg/dL (ref 6–20)
CALCIUM: 9.1 mg/dL (ref 8.9–10.3)
CO2: 27 mmol/L (ref 22–32)
Chloride: 103 mmol/L (ref 101–111)
Creatinine, Ser: 0.56 mg/dL (ref 0.44–1.00)
Glucose, Bld: 129 mg/dL — ABNORMAL HIGH (ref 65–99)
POTASSIUM: 3.5 mmol/L (ref 3.5–5.1)
Sodium: 137 mmol/L (ref 135–145)
TOTAL PROTEIN: 6.8 g/dL (ref 6.5–8.1)

## 2016-09-13 LAB — I-STAT CG4 LACTIC ACID, ED
LACTIC ACID, VENOUS: 1.22 mmol/L (ref 0.5–1.9)
LACTIC ACID, VENOUS: 1.13 mmol/L (ref 0.5–1.9)

## 2016-09-13 LAB — VITAMIN B12: VITAMIN B 12: 478 pg/mL (ref 180–914)

## 2016-09-13 LAB — MRSA PCR SCREENING: MRSA by PCR: NEGATIVE

## 2016-09-13 MED ORDER — MAGNESIUM OXIDE 400 (241.3 MG) MG PO TABS
200.0000 mg | ORAL_TABLET | Freq: Every day | ORAL | Status: DC
  Administered 2016-09-13 – 2016-09-16 (×4): 200 mg via ORAL
  Filled 2016-09-13 (×5): qty 1

## 2016-09-13 MED ORDER — FLUTICASONE PROPIONATE 50 MCG/ACT NA SUSP
2.0000 | Freq: Every day | NASAL | Status: DC
  Administered 2016-09-13 – 2016-09-15 (×3): 2 via NASAL
  Filled 2016-09-13: qty 16

## 2016-09-13 MED ORDER — DARIFENACIN HYDROBROMIDE ER 15 MG PO TB24
15.0000 mg | ORAL_TABLET | Freq: Every day | ORAL | Status: DC
  Administered 2016-09-13 – 2016-09-16 (×3): 15 mg via ORAL
  Filled 2016-09-13 (×4): qty 1

## 2016-09-13 MED ORDER — DICLOFENAC SODIUM 1 % TD GEL
2.0000 g | Freq: Two times a day (BID) | TRANSDERMAL | Status: DC
  Administered 2016-09-13 – 2016-09-16 (×6): 2 g via TOPICAL
  Filled 2016-09-13: qty 100

## 2016-09-13 MED ORDER — ADULT MULTIVITAMIN W/MINERALS CH
1.0000 | ORAL_TABLET | Freq: Every day | ORAL | Status: DC
  Administered 2016-09-14 – 2016-09-16 (×3): 1 via ORAL
  Filled 2016-09-13 (×4): qty 1

## 2016-09-13 MED ORDER — ASPIRIN 81 MG PO CHEW
81.0000 mg | CHEWABLE_TABLET | Freq: Every day | ORAL | Status: DC
  Administered 2016-09-13 – 2016-09-16 (×4): 81 mg via ORAL
  Filled 2016-09-13 (×4): qty 1

## 2016-09-13 MED ORDER — RISAQUAD PO CAPS
1.0000 | ORAL_CAPSULE | Freq: Every day | ORAL | Status: DC
  Administered 2016-09-13 – 2016-09-16 (×4): 1 via ORAL
  Filled 2016-09-13 (×4): qty 1

## 2016-09-13 MED ORDER — SERTRALINE HCL 50 MG PO TABS
50.0000 mg | ORAL_TABLET | Freq: Every day | ORAL | Status: DC
  Administered 2016-09-13 – 2016-09-16 (×4): 50 mg via ORAL
  Filled 2016-09-13 (×4): qty 1

## 2016-09-13 MED ORDER — DEXTROSE 5 % IV SOLN
2.0000 g | INTRAVENOUS | Status: DC
  Filled 2016-09-13: qty 2

## 2016-09-13 MED ORDER — AMLODIPINE BESYLATE 10 MG PO TABS
10.0000 mg | ORAL_TABLET | Freq: Every day | ORAL | Status: DC
  Administered 2016-09-13 – 2016-09-15 (×3): 10 mg via ORAL
  Filled 2016-09-13 (×4): qty 1

## 2016-09-13 MED ORDER — ACETAMINOPHEN 325 MG PO TABS: 650.0000 mg | ORAL_TABLET | Freq: Four times a day (QID) | ORAL | Status: DC | PRN

## 2016-09-13 MED ORDER — SODIUM CHLORIDE 0.9 % IV BOLUS (SEPSIS)
1000.0000 mL | Freq: Once | INTRAVENOUS | Status: AC
  Administered 2016-09-13: 1000 mL via INTRAVENOUS

## 2016-09-13 MED ORDER — ZOLPIDEM TARTRATE 5 MG PO TABS
5.0000 mg | ORAL_TABLET | Freq: Every evening | ORAL | Status: DC | PRN
  Administered 2016-09-13 – 2016-09-15 (×3): 5 mg via ORAL
  Filled 2016-09-13 (×3): qty 1

## 2016-09-13 MED ORDER — POTASSIUM CHLORIDE 2 MEQ/ML IV SOLN
INTRAVENOUS | Status: AC
  Administered 2016-09-13: 19:00:00 via INTRAVENOUS
  Filled 2016-09-13: qty 1000

## 2016-09-13 MED ORDER — MIRABEGRON ER 25 MG PO TB24
25.0000 mg | ORAL_TABLET | Freq: Every day | ORAL | Status: DC
  Administered 2016-09-13 – 2016-09-16 (×3): 25 mg via ORAL
  Filled 2016-09-13 (×4): qty 1

## 2016-09-13 MED ORDER — VITAMIN D3 25 MCG (1000 UNIT) PO TABS
2000.0000 [IU] | ORAL_TABLET | Freq: Every day | ORAL | Status: DC
  Administered 2016-09-13 – 2016-09-16 (×4): 2000 [IU] via ORAL
  Filled 2016-09-13 (×5): qty 2

## 2016-09-13 MED ORDER — LOSARTAN POTASSIUM 50 MG PO TABS
50.0000 mg | ORAL_TABLET | Freq: Every day | ORAL | Status: DC
  Administered 2016-09-13 – 2016-09-16 (×4): 50 mg via ORAL
  Filled 2016-09-13 (×4): qty 1

## 2016-09-13 MED ORDER — SODIUM CHLORIDE 0.9% FLUSH
3.0000 mL | Freq: Two times a day (BID) | INTRAVENOUS | Status: DC
  Administered 2016-09-13 – 2016-09-15 (×5): 3 mL via INTRAVENOUS

## 2016-09-13 MED ORDER — DONEPEZIL HCL 5 MG PO TABS
10.0000 mg | ORAL_TABLET | Freq: Two times a day (BID) | ORAL | Status: DC
  Administered 2016-09-13 – 2016-09-16 (×6): 10 mg via ORAL
  Filled 2016-09-13 (×7): qty 2

## 2016-09-13 MED ORDER — METOPROLOL TARTRATE 25 MG PO TABS
25.0000 mg | ORAL_TABLET | Freq: Two times a day (BID) | ORAL | Status: DC
  Administered 2016-09-13 – 2016-09-16 (×6): 25 mg via ORAL
  Filled 2016-09-13 (×7): qty 1

## 2016-09-13 MED ORDER — ONDANSETRON HCL 4 MG PO TABS: 4.0000 mg | ORAL_TABLET | Freq: Four times a day (QID) | ORAL | Status: DC | PRN

## 2016-09-13 MED ORDER — SIMVASTATIN 10 MG PO TABS
10.0000 mg | ORAL_TABLET | Freq: Every day | ORAL | Status: DC
  Administered 2016-09-13 – 2016-09-15 (×3): 10 mg via ORAL
  Filled 2016-09-13 (×3): qty 1

## 2016-09-13 MED ORDER — ACETAMINOPHEN 650 MG RE SUPP: 650.0000 mg | Freq: Four times a day (QID) | RECTAL | Status: DC | PRN

## 2016-09-13 MED ORDER — GABAPENTIN 400 MG PO CAPS
800.0000 mg | ORAL_CAPSULE | Freq: Three times a day (TID) | ORAL | Status: DC
  Administered 2016-09-13: 800 mg via ORAL
  Administered 2016-09-14: 400 mg via ORAL
  Administered 2016-09-14 – 2016-09-16 (×6): 800 mg via ORAL
  Filled 2016-09-13 (×8): qty 2

## 2016-09-13 MED ORDER — ENOXAPARIN SODIUM 40 MG/0.4ML ~~LOC~~ SOLN
40.0000 mg | SUBCUTANEOUS | Status: DC
  Filled 2016-09-13 (×3): qty 0.4

## 2016-09-13 MED ORDER — ONDANSETRON HCL 4 MG/2ML IJ SOLN
4.0000 mg | Freq: Four times a day (QID) | INTRAMUSCULAR | Status: DC | PRN
  Administered 2016-09-15 – 2016-09-16 (×3): 4 mg via INTRAVENOUS
  Filled 2016-09-13 (×3): qty 2

## 2016-09-13 MED ORDER — DEXTROSE 5 % IV SOLN
2.0000 g | Freq: Once | INTRAVENOUS | Status: AC
  Administered 2016-09-13: 2 g via INTRAVENOUS
  Filled 2016-09-13: qty 2

## 2016-09-13 NOTE — ED Triage Notes (Signed)
Patient was in the ER yesterday and blood cultures were obtained. Patient was sent home on PO antibiotics. Patients blood cultures came back positive and the patient was instructed to come back to the ER for IV antibiotics.

## 2016-09-13 NOTE — H&P (Addendum)
History and Physical  [name] [id number] DOB: 1929/04/07 DOA: 09/13/2016   PCP: Jeanmarie Hubert, MD   Patient coming from: Home  Chief Complaint: generalized weakness  HPI:  Kristie [name] is a 83 y.o. female with medical history of hypertension, hyperlipidemia, fibromyalgia, cognitive impairment presenting with 2 day history of generalized weakness. The patient states that this began when she woke up on the morning of 09/12/2016. She was so weak she had difficulty getting out of bed. She visited the emergency department on the morning of 09/12/2016. Urinalysis showed TNTC WBC, and blood cultures were obtained at that time. The patient was afebrile and hemodynamically stable. She was discharged home from the emergency department in stable condition with a prescription for ciprofloxacin. The patient has not filled the prescription. Blood cultures obtained on 09/12/2016 became positive on the morning of 09/13/2016 growing presumptive Escherichia coli. As a result, the patient was called to come back to the hospital for further evaluation and treatment. She continues to feel generalized weakness. In fact, the patient had a mechanical fall on the evening of 09/12/2016, but did not have syncope or headache or head. At baseline, the patient uses a walker to ambulate. The patient has had subjective fevers and chills, but denies chest pain, shortness of breath, sore throat, coughing, hemoptysis, nausea, vomiting, diarrhea, abdominal pain, dysuria, hematuria.  In the emergency department, the patient had a temperature of 99.86F. She was mildly tachycardic in the low 100s. She was hemodynamically stable saturating well on room air. BMP and hepatic enzymes were unremarkable. WBC was 11.1 with platelets 146,000. Lactic acid was 1.22. Chest x-ray on 09/12/2016 was negative for acute findings.  Assessment/Plan: Escherichia coli bacteremia -Source is likely urine -Continue ceftriaxone 2 g daily -Repeat  blood cultures 2 sets  Pyuria/UTI -09/12/2016 Urinalysis showed TNTC WBC -Continue ceftriaxone pending culture data  Essential Hypertension -Continue amlodipine, losartan and metoprolol tartrate  Thrombocytopenia -Likely secondary to infectious process -Check B12  Generalized weakness -Secondary to infectious process -TSH -Serum B12 -PT evaluation  Hyperlipidemia -Continue statin  Fibromyalgia -The patient takes ibuprofen 400 mg daily for the past 2-3 years--I have advised the patient to discuss possible alternatives with her primary care provider -Continue acetaminophen -Continue Voltaren gel and gabapentin  Cognitive impairment -Continue Aricept       Past Medical History:  Diagnosis Date  . Cataract   . Fibromyalgia 04/25/2014  . Hyperlipidemia   . Hypertension   . Lesion of sciatic nerve   . Low back pain   . Memory loss   . Spinal stenosis of lumbar region 04/25/2014  . Tremor   . Weakness    Past Surgical History:  Procedure Laterality Date  . ABDOMINAL HYSTERECTOMY  1963  . APPENDECTOMY    . CATARACT EXTRACTION W/ INTRAOCULAR LENS  IMPLANT, BILATERAL  2005 and 2007  . COLON SURGERY  1974   colon polyp  . COLON SURGERY  2002   colon polyps(2)  Dr Collene Mares  . COLONOSCOPY W/ POLYPECTOMY  1974and 2002  . EYE SURGERY  2005/2007   cataract renoval  Dr. Bing Plume  . KNEE SURGERY  2009   Dr. Ronnie Derby  . LUMBAR LAMINECTOMY  2012   L4-5 with spinous process fixation (06/22/11) Dr. Tommi Emery  . schwanoma  1987   residual right foot drop and right leg pain. Dr. Deirdre Pippins  . SMALL INTESTINE SURGERY  1990   bowel obstruction, Dr. Rebekah Chesterfield  . [place]  History:  reports that she quit smoking about 42 years ago. She has never used smokeless tobacco. She reports that she drinks about 1.2 oz of alcohol per week . She reports that she does not use drugs.   Family History  Problem Relation Age of Onset  . Breast cancer Mother   . Heart disease  Mother   . Dementia Father   . Cancer Sister     Esophageal      Allergies  Allergen Reactions  . Restoril [Temazepam]     Nightmares     Prior to Admission medications   Medication Sig Start Date End Date Taking? Authorizing Provider  acetaminophen (TYLENOL) 500 MG tablet Take 500 mg by mouth. Take 2 tablets at night   Yes Historical Provider, MD  acidophilus (RISAQUAD) CAPS Take 1 capsule by mouth daily.   Yes Historical Provider, MD  amLODipine (NORVASC) 10 MG tablet Take 1 tablet (10 mg total) by mouth daily. 08/16/16  Yes Man X Mast, NP  aspirin 81 MG chewable tablet Chew 81 mg by mouth daily.   Yes Historical Provider, MD  cholecalciferol (VITAMIN D) 1000 UNITS tablet Take 1,000 Units by mouth daily. Take 2,000 units daily   Yes Historical Provider, MD  diclofenac sodium (VOLTAREN) 1 % GEL Apply 2 g topically 2 (two) times daily.   Yes Historical Provider, MD  donepezil (ARICEPT) 10 MG tablet Take 1 tablet (10 mg total) by mouth 2 (two) times daily. 08/16/16  Yes Man X Mast, NP  fluticasone (FLONASE) 50 MCG/ACT nasal spray Place 2 sprays into both nostrils daily. 2 sprays in each nostril once daily 08/16/16  Yes Man X Mast, NP  gabapentin (NEURONTIN) 800 MG tablet Take 1 tablet (800 mg total) by mouth 3 (three) times daily. 08/16/16  Yes Man X Mast, NP  losartan (COZAAR) 50 MG tablet Take 1 tablet (50 mg total) by mouth daily. 08/16/16  Yes Man X Mast, NP  Magnesium 250 MG TABS Take 250 mg by mouth daily. Take one tablet daily    Yes Historical Provider, MD  metoprolol tartrate (LOPRESSOR) 25 MG tablet Take 25 mg by mouth 2 (two) times daily.   Yes Historical Provider, MD  mirabegron ER (MYRBETRIQ) 25 MG TB24 tablet Take 1 tablet (25 mg total) by mouth daily. 08/16/16  Yes Man X Mast, NP  sertraline (ZOLOFT) 50 MG tablet Take 1 tablet (50 mg total) by mouth daily. 08/16/16  Yes Man X Mast, NP  simvastatin (ZOCOR) 10 MG tablet Take 1 tablet (10 mg total) by mouth at bedtime. 08/16/16  Yes  Man X Mast, NP  solifenacin (VESICARE) 10 MG tablet Take 1 tablet (10 mg total) by mouth daily. 08/16/16  Yes Man X Mast, NP  zolpidem (AMBIEN) 10 MG tablet TAKE 1 TABLET AT BEDTIME AS NEEDED. 07/01/16  Yes Naitik Panwala, PA-C  ciprofloxacin (CIPRO) 500 MG tablet Take 1 tablet (500 mg total) by mouth 2 (two) times daily. One po bid x 7 days 09/12/16   Deno Etienne, DO  losartan (COZAAR) 100 MG tablet Take 1 tablet (100 mg total) by mouth daily. Patient not taking: Reported on 09/13/2016 08/19/16   Man X Mast, NP  metoprolol succinate (TOPROL-XL) 25 MG 24 hr tablet Take 1 tablet (25 mg total) by mouth daily. Start with one tablet if blood pressure is 140/90. Patient not taking: Reported on 09/13/2016 08/19/16   Man X Mast, NP  Multiple Vitamin (MULTIVITAMIN WITH MINERALS) TABS Take 1 tablet by mouth daily.  Historical Provider, MD    Review of Systems:  Constitutional:  No weight loss, night sweats Head&Eyes: No headache.  No vision loss.  No eye pain or scotoma ENT:  No Difficulty swallowing,Tooth/dental problems,Sore throat,  No ear ache, post nasal drip,  Cardio-vascular:  No chest pain, Orthopnea, PND, swelling in lower extremities,  dizziness, palpitations  GI:  No  abdominal pain, nausea, vomiting, diarrhea, loss of appetite, hematochezia, melena, heartburn, indigestion, Resp:  No shortness of breath with exertion or at rest. No cough. No coughing up of blood .No wheezing.No chest wall deformity  Skin:  no rash or lesions.  GU:  no dysuria, change in color of urine, no urgency or frequency. No flank pain.  Musculoskeletal:  No joint pain or swelling. No decreased range of motion. No back pain.  Psych:  No change in mood or affect.  Neurologic: No headache, no dysesthesia, no focal weakness, no vision loss. No syncope  Physical Exam: Vitals:   09/13/16 0855  BP: 166/90  Pulse: 103  Resp: 18  Temp: 99.4 F (37.4 C)  TempSrc: Oral  SpO2: 98%   General:  A&O x 3, NAD,  nontoxic, pleasant/cooperative Head/Eye: No conjunctival hemorrhage, no icterus, [place]/AT, No nystagmus ENT:  No icterus,  No thrush, good dentition, no pharyngeal exudate Neck:  No masses, no lymphadenpathy, no bruits CV:  RRR, no rub, no gallop, no S3 Lung:  Bibasilar crackles, good air movement, no wheeze, no rhonchi Abdomen: soft/NT, +BS, nondistended, no peritoneal signs Ext: No cyanosis, No rashes, No petechiae, No lymphangitis, No edema Neuro: CNII-XII intact, strength 4/5 in bilateral upper and lower extremities, no dysmetria  Labs on Admission:  Basic Metabolic Panel:  Recent Labs Lab 09/12/16 0915 09/13/16 0940  NA 139 137  K 3.6 3.5  CL 104 103  CO2 28 27  GLUCOSE 125* 129*  BUN 11 11  CREATININE 0.63 0.56  CALCIUM 9.1 9.1   Liver Function Tests:  Recent Labs Lab 09/12/16 0915 09/13/16 0940  AST 21 23  ALT 15 21  ALKPHOS 57 62  BILITOT 0.9 0.8  PROT 6.5 6.8  ALBUMIN 3.6 3.7   No results for input(s): LIPASE, AMYLASE in the last 168 hours. No results for input(s): AMMONIA in the last 168 hours. CBC:  Recent Labs Lab 09/12/16 0915 09/13/16 0940  WBC 10.0 11.1*  NEUTROABS 7.9* 9.1*  HGB 14.5 13.9  HCT 43.4 42.1  MCV 91.9 93.3  PLT 160 146*   Coagulation Profile: No results for input(s): INR, PROTIME in the last 168 hours. Cardiac Enzymes: No results for input(s): CKTOTAL, CKMB, CKMBINDEX, TROPONINI in the last 168 hours. BNP: Invalid input(s): POCBNP CBG: No results for input(s): GLUCAP in the last 168 hours. Urine analysis:    Component Value Date/Time   COLORURINE YELLOW 09/12/2016 1127   APPEARANCEUR HAZY (A) 09/12/2016 1127   LABSPEC 1.006 09/12/2016 1127   PHURINE 6.0 09/12/2016 1127   GLUCOSEU NEGATIVE 09/12/2016 1127   HGBUR SMALL (A) 09/12/2016 1127   BILIRUBINUR NEGATIVE 09/12/2016 1127   KETONESUR NEGATIVE 09/12/2016 1127   PROTEINUR NEGATIVE 09/12/2016 1127   UROBILINOGEN 0.2 04/15/2012 0720   NITRITE POSITIVE (A) 09/12/2016  1127   LEUKOCYTESUR LARGE (A) 09/12/2016 1127   Sepsis Labs: @LABRCNTIP (procalcitonin:4,lacticidven:4) ) Recent Results (from the past 240 hour(s))  Blood Culture (routine x 2)     Status: None (Preliminary result)   Collection Time: 09/12/16  9:44 AM  Result Value Ref Range Status   Specimen Description BLOOD LEFT  ARM  Final   Special Requests BOTTLES DRAWN AEROBIC AND ANAEROBIC 5 CC EACH  Final   Culture  Setup Time   Final    GRAM NEGATIVE RODS IN BOTH AEROBIC AND ANAEROBIC BOTTLES Organism ID to follow CRITICAL RESULT CALLED TO, READ BACK BY AND VERIFIED WITH: S MAYNARD,RN @0618  09/13/16 MKELLY,MLT Performed at [facility], [place] [address], [place], [place] [address]    Culture PENDING  Incomplete   Report Status PENDING  Incomplete  Blood Culture ID Panel (Reflexed)     Status: Abnormal   Collection Time: 09/12/16  9:44 AM  Result Value Ref Range Status   Enterococcus species NOT DETECTED NOT DETECTED Final   Listeria monocytogenes NOT DETECTED NOT DETECTED Final   Staphylococcus species NOT DETECTED NOT DETECTED Final   Staphylococcus aureus NOT DETECTED NOT DETECTED Final   Streptococcus species NOT DETECTED NOT DETECTED Final   Streptococcus agalactiae NOT DETECTED NOT DETECTED Final   Streptococcus pneumoniae NOT DETECTED NOT DETECTED Final   Streptococcus pyogenes NOT DETECTED NOT DETECTED Final   Acinetobacter baumannii NOT DETECTED NOT DETECTED Final   Enterobacteriaceae species DETECTED (A) NOT DETECTED Final    Comment: Enterobacteriaceae represent a large family of gram-negative bacteria, not a single organism. CRITICAL RESULT CALLED TO, READ BACK BY AND VERIFIED WITH: S MAYNARD,RN @0618  09/13/16 MKELLY,MLT    Enterobacter cloacae complex NOT DETECTED NOT DETECTED Final   Escherichia coli DETECTED (A) NOT DETECTED Final    Comment: CRITICAL RESULT CALLED TO, READ BACK BY AND VERIFIED WITH: S MAYNARD,RN @0618  09/13/16 MKELLY,MLT    Klebsiella oxytoca NOT  DETECTED NOT DETECTED Final   Klebsiella pneumoniae NOT DETECTED NOT DETECTED Final   Proteus species NOT DETECTED NOT DETECTED Final   Serratia marcescens NOT DETECTED NOT DETECTED Final   Carbapenem resistance NOT DETECTED NOT DETECTED Final   Haemophilus influenzae NOT DETECTED NOT DETECTED Final   Neisseria meningitidis NOT DETECTED NOT DETECTED Final   Pseudomonas aeruginosa NOT DETECTED NOT DETECTED Final   Candida albicans NOT DETECTED NOT DETECTED Final   Candida glabrata NOT DETECTED NOT DETECTED Final   Candida krusei NOT DETECTED NOT DETECTED Final   Candida parapsilosis NOT DETECTED NOT DETECTED Final   Candida tropicalis NOT DETECTED NOT DETECTED Final    Comment: Performed at [facility], [place] [address], [place], [place] [address]  Urine culture     Status: Abnormal (Preliminary result)   Collection Time: 09/12/16 11:27 AM  Result Value Ref Range Status   Specimen Description URINE, CATHETERIZED  Final   Special Requests NONE  Final   Culture >=100,000 COLONIES/mL ESCHERICHIA COLI (A)  Final   Report Status PENDING  Incomplete     Radiological Exams on Admission: Dg Chest 2 View  Result Date: 09/12/2016 CLINICAL DATA:  Weakness for 2 days. Ex-smoker with history of hypertension. EXAM: CHEST  2 VIEW COMPARISON:  Portable chest 05/05/2010. FINDINGS: The heart size and mediastinal contours are stable. There is mild aortic tortuosity. The lungs are clear. There is no pleural effusion or pneumothorax. No acute osseous findings are evident. Telemetry leads overlie the chest. IMPRESSION: Stable chest.  No active cardiopulmonary process. Electronically Signed   By: Richardean Sale M.D.   On: 09/12/2016 10:17    EKG--personally reviewed.  Sinus rhythm, no ST-T changes   Time spent:60 minutes Code Status:   FULL Family Communication:  Spouse updated at bedside Disposition Plan: expect 2-3 day hospitalization Consults called: none DVT Prophylaxis: [place] Lovenox  Garland Smouse,  Markhi Kleckner, DO  [facility] Pager [phone]  If 7PM-7AM, please contact night-coverage [email or web address] Password [facility] 09/13/2016, 11:26 AM

## 2016-09-13 NOTE — ED Notes (Signed)
Assisted patient to restroom and back to stretcher with wheelchair.

## 2016-09-13 NOTE — Clinical Social Work Note (Signed)
Clinical Social Work Assessment  Patient Details  Name: Kristie [name] MRN: [id number] Date of Birth: [birth year]  Date of referral:  09/13/16               Reason for consult:  Facility Placement                Permission sought to share information with:  Facility Sport and exercise psychologist, Family Supports Permission granted to share information::  Yes, Verbal Permission Granted  Name::        Agency::     Relationship::     Contact Information:     Housing/Transportation Living arrangements for the past 2 months:  [place] ([place]) Source of Information:  Patient Patient Interpreter Needed:  None Criminal Activity/Legal Involvement Pertinent to Current Situation/Hospitalization:    Significant Relationships:  Spouse Lives with:  Spouse Do you feel safe going back to the place where you live?  Yes Need for family participation in patient care:  No (Coment)  Care giving concerns:  None listed by pt/family   Social Worker assessment / plan:  CSW met with pt and confirmed pt's plan to be discharged back to [place] to live at discharge.  Pt has been living independently prior to discharge with her spouse Kristie [name], who was at bedside.   Employment status:  Retired Nurse, adult PT Recommendations:  Not assessed at this time Information / Referral to community resources:     Patient/Family's Response to care:  Patient alert and oriented.  Patient and spouse agreeable to plan.  Pt's spouse supportive and strongly involved in pt.'s care.  Pt. and spouse pleasant and appreciated CSW intervention.    Patient/Family's Understanding of and Emotional Response to Diagnosis, Current Treatment, and Prognosis:  Still assessing  Emotional Assessment Appearance:  Appears younger than stated age Attitude/Demeanor/Rapport:  Apprehensive Affect (typically observed):  Adaptable, Apprehensive, Calm, Stoic Orientation:   Oriented to Place, Oriented to  Time, Oriented to Situation Alcohol / Substance use:    Psych involvement (Current and /or in the community):     Discharge Needs  Concerns to be addressed:  No discharge needs identified Readmission within the last 30 days:  No Current discharge risk:  None Barriers to Discharge:  No Barriers Identified   Kristie [name], LCSWA 09/13/2016, 4:48 PM

## 2016-09-13 NOTE — ED Provider Notes (Signed)
[facility] DEPT Provider Note   CSN: [id number] Arrival date & time: 09/13/16  0848     History   Chief Complaint No chief complaint on file.   HPI Kristie [name] is a 83 y.o. female.  HPI   83 year old female with a history of fibromyalgia presents with concern for generalized weakness, with positive blood culture found on labs drawn yesterday at work [employer]. Patient was seen yesterday with generalized weakness, fever, found to have UTI and was placed on Cipro. Blood cultures returned positive for Escherichia coli and Enterobacter. She was called and told to return to the emergency department.  Reports generalized weakness and chills. Reports she fell from standing today and couldn't get up. Denies pain from fall, loss of consciousness or head trauma. For that she just felt so generally weak that she was unable to stand. Reports that maybe she's had a little bit of urinary symptoms, mild cough, however denies any other significant symptoms.  Past Medical History:  Diagnosis Date  . Cataract   . Fibromyalgia 04/25/2014  . Hyperlipidemia   . Hypertension   . Lesion of sciatic nerve   . Low back pain   . Memory loss   . Spinal stenosis of lumbar region 04/25/2014  . Tremor   . Weakness     Patient Active Problem List   Diagnosis Date Noted  . Bacteremia 09/13/2016  . Depression 08/05/2016  . Memory deficit 08/05/2016  . Spinal stenosis of lumbar region 04/25/2014  . Fibromyalgia 04/25/2014  . Urine incontinence 04/25/2014  . Senile osteoporosis 04/25/2014  . Insomnia 04/25/2014  . Right foot drop 04/27/2013  . Abnormality of gait 04/27/2013  . Weakness   . Nausea 04/15/2012  . Hyperlipidemia 04/14/2012  . Pubic bone fracture ([place]) 04/14/2012  . Hypokalemia 04/14/2012  . Essential hypertension 08/18/2007  . Osteoarthritis 08/18/2007  . Foot drop, right foot 08/18/2007  . OTHER ACQUIRED DEFORMITY OF ANKLE AND FOOT OTHER 08/18/2007  . UNEQUAL LEG LENGTH  08/18/2007  . COLONIC POLYPS, HX OF 08/18/2007    Past Surgical History:  Procedure Laterality Date  . ABDOMINAL HYSTERECTOMY  1963  . APPENDECTOMY    . CATARACT EXTRACTION W/ INTRAOCULAR LENS  IMPLANT, BILATERAL  2005 and 2007  . COLON SURGERY  1974   colon polyp  . COLON SURGERY  2002   colon polyps(2)  Dr Collene Mares  . COLONOSCOPY W/ POLYPECTOMY  1974and 2002  . EYE SURGERY  2005/2007   cataract renoval  Dr. Bing Plume  . KNEE SURGERY  2009   Dr. Ronnie Derby  . LUMBAR LAMINECTOMY  2012   L4-5 with spinous process fixation (06/22/11) Dr. Tommi Emery  . schwanoma  1987   residual right foot drop and right leg pain. Dr. Deirdre Pippins  . SMALL INTESTINE SURGERY  1990   bowel obstruction, Dr. Rebekah Chesterfield  . TONSILLECTOMY  1940    OB History    No data available       Home Medications    Prior to Admission medications   Medication Sig Start Date End Date Taking? Authorizing Provider  acetaminophen (TYLENOL) 500 MG tablet Take 500 mg by mouth. Take 2 tablets at night   Yes Historical Provider, MD  acidophilus (RISAQUAD) CAPS Take 1 capsule by mouth daily.   Yes Historical Provider, MD  amLODipine (NORVASC) 10 MG tablet Take 1 tablet (10 mg total) by mouth daily. 08/16/16  Yes Man X Mast, NP  aspirin 81 MG chewable tablet Chew 81 mg by mouth daily.  Yes Historical Provider, MD  cholecalciferol (VITAMIN D) 1000 UNITS tablet Take 1,000 Units by mouth daily. Take 2,000 units daily   Yes Historical Provider, MD  diclofenac sodium (VOLTAREN) 1 % GEL Apply 2 g topically 2 (two) times daily.   Yes Historical Provider, MD  donepezil (ARICEPT) 10 MG tablet Take 1 tablet (10 mg total) by mouth 2 (two) times daily. 08/16/16  Yes Man X Mast, NP  fluticasone (FLONASE) 50 MCG/ACT nasal spray Place 2 sprays into both nostrils daily. 2 sprays in each nostril once daily 08/16/16  Yes Man X Mast, NP  gabapentin (NEURONTIN) 800 MG tablet Take 1 tablet (800 mg total) by mouth 3 (three) times daily. 08/16/16  Yes Man X Mast, NP   losartan (COZAAR) 50 MG tablet Take 1 tablet (50 mg total) by mouth daily. 08/16/16  Yes Man X Mast, NP  Magnesium 250 MG TABS Take 250 mg by mouth daily. Take one tablet daily    Yes Historical Provider, MD  metoprolol tartrate (LOPRESSOR) 25 MG tablet Take 25 mg by mouth 2 (two) times daily.   Yes Historical Provider, MD  mirabegron ER (MYRBETRIQ) 25 MG TB24 tablet Take 1 tablet (25 mg total) by mouth daily. 08/16/16  Yes Man X Mast, NP  sertraline (ZOLOFT) 50 MG tablet Take 1 tablet (50 mg total) by mouth daily. 08/16/16  Yes Man X Mast, NP  simvastatin (ZOCOR) 10 MG tablet Take 1 tablet (10 mg total) by mouth at bedtime. 08/16/16  Yes Man X Mast, NP  solifenacin (VESICARE) 10 MG tablet Take 1 tablet (10 mg total) by mouth daily. 08/16/16  Yes Man X Mast, NP  zolpidem (AMBIEN) 10 MG tablet TAKE 1 TABLET AT BEDTIME AS NEEDED. 07/01/16  Yes Naitik Panwala, PA-C  ciprofloxacin (CIPRO) 500 MG tablet Take 1 tablet (500 mg total) by mouth 2 (two) times daily. One po bid x 7 days 09/12/16   Deno Etienne, DO  losartan (COZAAR) 100 MG tablet Take 1 tablet (100 mg total) by mouth daily. Patient not taking: Reported on 09/13/2016 08/19/16   Man X Mast, NP  metoprolol succinate (TOPROL-XL) 25 MG 24 hr tablet Take 1 tablet (25 mg total) by mouth daily. Start with one tablet if blood pressure is 140/90. Patient not taking: Reported on 09/13/2016 08/19/16   Man X Mast, NP  Multiple Vitamin (MULTIVITAMIN WITH MINERALS) TABS Take 1 tablet by mouth daily.    Historical Provider, MD    Family History Family History  Problem Relation Age of Onset  . Breast cancer Mother   . Heart disease Mother   . Dementia Father   . Cancer Sister     Esophageal     Social History Social History  Substance Use Topics  . Smoking status: Former Smoker    Quit date: 04/25/1974  . Smokeless tobacco: Never Used     Comment: Quit thirty years ago.  . Alcohol use 1.2 oz/week    2 Glasses of wine per week     Comment: 2 glasses of  wine daily     Allergies   Restoril [temazepam]   Review of Systems Review of Systems  Constitutional: Positive for chills and fatigue. Negative for fever (yesterday on arrival to the ED).  HENT: Negative for sore throat.   Eyes: Negative for visual disturbance.  Respiratory: Negative for cough and shortness of breath.   Cardiovascular: Negative for chest pain.  Gastrointestinal: Negative for abdominal pain, nausea and vomiting.  Genitourinary: Positive for dysuria (  maybe a little bit). Negative for difficulty urinating.  Musculoskeletal: Negative for back pain and neck pain.  Skin: Negative for rash.  Neurological: Negative for dizziness, syncope, speech difficulty, weakness, numbness and headaches.     Physical Exam Updated Vital Signs BP 166/90 (BP Location: Left Arm)   Pulse 103   Temp 99.4 F (37.4 C) (Oral)   Resp 18   SpO2 98%   Physical Exam  Constitutional: She is oriented to person, place, and time. She appears well-developed and well-nourished. No distress.  HENT:  Head: Normocephalic and atraumatic.  Eyes: Conjunctivae and EOM are normal.  Neck: Normal range of motion.  Cardiovascular: Normal rate, regular rhythm, normal heart sounds and intact distal pulses.  Exam reveals no gallop and no friction rub.   No murmur heard. Pulmonary/Chest: Effort normal and breath sounds normal. No respiratory distress. She has no wheezes. She has no rales.  Abdominal: Soft. She exhibits no distension. There is no tenderness. There is no guarding.  Musculoskeletal: She exhibits no edema or tenderness.  Neurological: She is alert and oriented to person, place, and time.  Skin: Skin is warm and dry. No rash noted. She is not diaphoretic. No erythema.  Nursing note and vitals reviewed.    ED Treatments / Results  Labs (all labs ordered are listed, but only abnormal results are displayed) Labs Reviewed  CBC WITH DIFFERENTIAL/PLATELET - Abnormal; Notable for the following:         Result Value   WBC 11.1 (*)    Platelets 146 (*)    Neutro Abs 9.1 (*)    Monocytes Absolute 1.1 (*)    All other components within normal limits  COMPREHENSIVE METABOLIC PANEL - Abnormal; Notable for the following:    Glucose, Bld 129 (*)    All other components within normal limits  CULTURE, BLOOD (ROUTINE X 2)  CULTURE, BLOOD (ROUTINE X 2)  I-STAT CG4 LACTIC ACID, ED    EKG  EKG Interpretation None       Radiology Dg Chest 2 View  Result Date: 09/12/2016 CLINICAL DATA:  Weakness for 2 days. Ex-smoker with history of hypertension. EXAM: CHEST  2 VIEW COMPARISON:  Portable chest 05/05/2010. FINDINGS: The heart size and mediastinal contours are stable. There is mild aortic tortuosity. The lungs are clear. There is no pleural effusion or pneumothorax. No acute osseous findings are evident. Telemetry leads overlie the chest. IMPRESSION: Stable chest.  No active cardiopulmonary process. Electronically Signed   By: Richardean Sale M.D.   On: 09/12/2016 10:17    Procedures Procedures (including critical care time)  Medications Ordered in ED Medications  sodium chloride 0.9 % bolus 1,000 mL (0 mLs Intravenous Stopped 09/13/16 1118)  cefTRIAXone (ROCEPHIN) 2 g in dextrose 5 % 50 mL IVPB (0 g Intravenous Stopped 09/13/16 1118)     Initial Impression / Assessment and Plan / ED Course  I have reviewed the triage vital signs and the nursing notes.  Pertinent labs & imaging results that were available during my care of the patient were reviewed by me and considered in my medical decision making (see chart for details).     83 year old female with a history of fibromyalgia presents with concern for generalized weakness, with positive blood culture found on labs drawn yesterday at work [employer]. Patient was seen yesterday with generalized weakness, fever, found to have UTI and was placed on Cipro. Blood cultures returned positive for Escherichia coli and Enterobacter. She was  called and told to  return to the emergency department.  Patient hemodynamically stable on arrival. Lactic acid is within normal limits. She does not show signs of sepsis at this time. Discussed with pharmacy, and gave 2 g of Rocephin for bacteremia.  Admitted for further care.   Final Clinical Impressions(s) / ED Diagnoses   Final diagnoses:  Bacteremia    New Prescriptions New Prescriptions   No medications on file     Gareth Morgan, MD 09/13/16 1126

## 2016-09-13 NOTE — ED Notes (Signed)
Patient was here yesterday and we drew blood cultures that were positive today so she is back to get admitted

## 2016-09-14 ENCOUNTER — Encounter (HOSPITAL_COMMUNITY): Payer: Self-pay

## 2016-09-14 LAB — COMPREHENSIVE METABOLIC PANEL
ALBUMIN: 3.1 g/dL — AB (ref 3.5–5.0)
ALK PHOS: 53 U/L (ref 38–126)
ALT: 22 U/L (ref 14–54)
AST: 23 U/L (ref 15–41)
Anion gap: 6 (ref 5–15)
BUN: 9 mg/dL (ref 6–20)
CALCIUM: 8.4 mg/dL — AB (ref 8.9–10.3)
CHLORIDE: 107 mmol/L (ref 101–111)
CO2: 26 mmol/L (ref 22–32)
CREATININE: 0.58 mg/dL (ref 0.44–1.00)
GFR calc Af Amer: >60 mL/min (ref >60)
GFR calc non Af Amer: >60 mL/min (ref >60)
GLUCOSE: 116 mg/dL — AB (ref 65–99)
Potassium: 3.6 mmol/L (ref 3.5–5.1)
SODIUM: 139 mmol/L (ref 135–145)
Total Bilirubin: 0.4 mg/dL (ref 0.3–1.2)
Total Protein: 6.2 g/dL — ABNORMAL LOW (ref 6.5–8.1)

## 2016-09-14 LAB — URINE CULTURE

## 2016-09-14 LAB — MAGNESIUM: Magnesium: 2.1 mg/dL (ref 1.7–2.4)

## 2016-09-14 MED ORDER — SODIUM CHLORIDE 0.9 % IV SOLN
1.0000 g | Freq: Three times a day (TID) | INTRAVENOUS | Status: DC
  Administered 2016-09-14 – 2016-09-15 (×4): 1 g via INTRAVENOUS
  Filled 2016-09-14 (×4): qty 1

## 2016-09-14 NOTE — Progress Notes (Signed)
Pharmacy Antibiotic Note  Kristie [name] is a 83 y.o. female admitted on 09/13/2016 with UTI/bactermia.  Pt was called for admission 2/19 due to +blood cx & started on Rocephin.  Urine cx now + ESBL Ecoli.  Pharmacy has been consulted for Zosyn dosing. Urine cx + ESBL, blood cx likely same. Renal function at patient's baseline.  Est CrCl>75ml/min. Discussed with Dr Wyline Copas & will change antibiotics to Meropenem given bacteremia.  Plan: Meropenem 1gm IV q8h- recommend switch to Invanz 1gm IV q24h at discharge to complete course Monitor renal function and cx data    Height: 5\' 8"  (172.7 cm) Weight: 187 lb 2.7 oz (84.9 kg) IBW/kg (Calculated) : 63.9  Temp (24hrs), Avg:99.3 F (37.4 C), Min:99.1 F (37.3 C), Max:99.5 F (37.5 C)   Recent Labs Lab 09/12/16 0915 09/12/16 1006 09/12/16 1223 09/13/16 0940 09/13/16 0954 09/13/16 1217 09/14/16 0513  WBC 10.0  --   --  11.1*  --   --   --   CREATININE 0.63  --   --  0.56  --   --  0.58  LATICACIDVEN  --  1.72 0.89  --  1.22 1.13  --     Estimated Creatinine Clearance: 57.6 mL/min (by C-G formula based on SCr of 0.58 mg/dL).    Allergies  Allergen Reactions  . Restoril [Temazepam]     Nightmares    Antimicrobials this admission: 2/19 Rocephin >> 2/20 2/20 Meropenem >>  Dose adjustments this admission:  Microbiology results: 2/19 BCx: sent 2/18 BCx: BCID + Ecoli 2/18 UCx: +Ecoli, ESBL (S+Imipenem, Nitrofurantoin, Zosyn, Bactrim)  2/19 MRSA PCR: negative  Thank you for allowing pharmacy to be a part of this patient's care.  Netta Cedars, PharmD, BCPS Pager: [phone] 09/14/2016 9:06 AM

## 2016-09-14 NOTE — Progress Notes (Signed)
PT Cancellation Note  Patient Details Name: [name] MRN: [id number] DOB: 1928-10-30   Cancelled Treatment:    Reason Eval/Treat Not Completed: PT screened, no needs identified, will sign off. Per RN, patient does not desire PT while in hospital. Tresa Endo PT [phone]    [name] 09/14/2016, 10:57 AM

## 2016-09-14 NOTE — Progress Notes (Signed)
PROGRESS NOTE    Kristie [name]  [id number] DOB: 1929-03-29 DOA: 09/13/2016 PCP: Jeanmarie Hubert, MD    Brief Narrative:  83 y.o. female with medical history of hypertension, hyperlipidemia, fibromyalgia, cognitive impairment presenting with 2 day history of generalized weakness. The patient states that this began when she woke up on the morning of 09/12/2016. She was so weak she had difficulty getting out of bed. She visited the emergency department on the morning of 09/12/2016. Urinalysis showed TNTC WBC, and blood cultures were obtained at that time. The patient was afebrile and hemodynamically stable. She was discharged home from the emergency department in stable condition with a prescription for ciprofloxacin. The patient has not filled the prescription. Blood cultures obtained on 09/12/2016 became positive on the morning of 09/13/2016 growing presumptive Escherichia coli. As a result, the patient was called to come back to the hospital for further evaluation and treatment. She continues to feel generalized weakness. In fact, the patient had a mechanical fall on the evening of 09/12/2016, but did not have syncope or headache or head. At baseline, the patient uses a walker to ambulate. The patient has had subjective fevers and chills, but denies chest pain, shortness of breath, sore throat, coughing, hemoptysis, nausea, vomiting, diarrhea, abdominal pain, dysuria, hematuria.  In the emergency department, the patient had a temperature of 99.26F. She was mildly tachycardic in the low 100s. She was hemodynamically stable saturating well on room air. BMP and hepatic enzymes were unremarkable. WBC was 11.1 with platelets 146,000. Lactic acid was 1.22. Chest x-ray on 09/12/2016 was negative for acute findings.  Assessment & Plan:   Active Problems:   Essential hypertension   Hyperlipidemia   Abnormality of gait   Bacteremia   Bacteremia due to Escherichia coli   UTI (urinary tract infection)  Thrombocytopenia (HCC)   Cognitive impairment  Multi-drug resistant Escherichia coli bacteremia and UTI -Urine culture and blood cultures from 2/18 confirm multidrug resistant ecoli (sensitive to imipenem, nitrofurantoin, zosyn, bactrim) -Was initially started on ceftriaxone 2 g daily -Repeat blood cultures 2 sets pending -Given patient's age, avoid bactrim. Pt now on meropenem -Possible need for PICC and IV abx at time of discharge based. Follow up culture results  Pyuria/UTI -09/12/2016 Urinalysis showed TNTC WBC -Per above  Essential Hypertension -Continue amlodipine, losartan and metoprolol tartrate -BP currently stable  Thrombocytopenia -Likely secondary to infectious process -B12 478 -Repeat CBC in AM  Generalized weakness -Secondary to infectious process -TSH normal at 1.897 -Pt declines physical therapy eval  Hyperlipidemia -Continue statin - Stable at present  Fibromyalgia -The patient takes ibuprofen 400 mg daily for the past 2-3 years--I have advised the patient to discuss possible alternatives with her primary care provider -Continue acetaminophen -Continue Voltaren gel and gabapentin - Currently stable  Cognitive impairment -Continue Aricept -Presently stable  DVT prophylaxis: Lovenox subQ Code Status: Full Family Communication: Pt in room, family at bedside Disposition Plan: Possible d/c home in 48hrs  Consultants:     Procedures:     Antimicrobials: Anti-infectives    Start     Dose/Rate Route Frequency Ordered Stop   09/14/16 1000  cefTRIAXone (ROCEPHIN) 2 g in dextrose 5 % 50 mL IVPB  Status:  Discontinued     2 g 100 mL/hr over 30 Minutes Intravenous Every 24 hours 09/13/16 1736 09/14/16 0812   09/14/16 1000  meropenem (MERREM) 1 g in sodium chloride 0.9 % 100 mL IVPB     1 g 200 mL/hr over 30 Minutes Intravenous Every  8 hours 09/14/16 0904     09/13/16 0915  cefTRIAXone (ROCEPHIN) 2 g in dextrose 5 % 50 mL IVPB     2 g 100  mL/hr over 30 Minutes Intravenous  Once 09/13/16 0904 09/13/16 1118       Subjective: Eager to go home  Objective: Vitals:   09/13/16 2113 09/14/16 0630 09/14/16 0937 09/14/16 1354  BP: (!) 146/77 (!) 143/83 (!) 147/87 131/77  Pulse: 100 85 86 73  Resp: 16 18 18 17   Temp: 99.2 F (37.3 C) 99.4 F (37.4 C) 99.4 F (37.4 C) 98.8 F (37.1 C)  TempSrc: Oral Oral Oral Oral  SpO2: 95% 97% 96% 94%  Weight:      Height:        Intake/Output Summary (Last 24 hours) at 09/14/16 1702 Last data filed at 09/14/16 0800  Gross per 24 hour  Intake              710 ml  Output              700 ml  Net               10 ml   Filed Weights   09/13/16 1739  Weight: 84.9 kg (187 lb 2.7 oz)    Examination:  General exam: Appears calm and comfortable  Respiratory system: Clear to auscultation. Respiratory effort normal. Cardiovascular system: S1 & S2 heard, RRR Gastrointestinal system: Abdomen is nondistended, soft and nontender. No organomegaly or masses felt. Normal bowel sounds heard. Central nervous system: Alert and oriented. No focal neurological deficits. Extremities: Symmetric 5 x 5 power. Skin: No rashes, lesions Psychiatry: Judgement and insight appear normal. Mood & affect appropriate.   Data Reviewed: I have personally reviewed following labs and imaging studies  CBC:  Recent Labs Lab 09/12/16 0915 09/13/16 0940  WBC 10.0 11.1*  NEUTROABS 7.9* 9.1*  HGB 14.5 13.9  HCT 43.4 42.1  MCV 91.9 93.3  PLT 160 123456*   Basic Metabolic Panel:  Recent Labs Lab 09/12/16 0915 09/13/16 0940 09/14/16 0513  NA 139 137 139  K 3.6 3.5 3.6  CL 104 103 107  CO2 28 27 26   GLUCOSE 125* 129* 116*  BUN 11 11 9   CREATININE 0.63 0.56 0.58  CALCIUM 9.1 9.1 8.4*  MG  --   --  2.1   GFR: Estimated Creatinine Clearance: 57.6 mL/min (by C-G formula based on SCr of 0.58 mg/dL). Liver Function Tests:  Recent Labs Lab 09/12/16 0915 09/13/16 0940 09/14/16 0513  AST 21 23 23     ALT 15 21 22   ALKPHOS 57 62 53  BILITOT 0.9 0.8 0.4  PROT 6.5 6.8 6.2*  ALBUMIN 3.6 3.7 3.1*   No results for input(s): LIPASE, AMYLASE in the last 168 hours. No results for input(s): AMMONIA in the last 168 hours. Coagulation Profile: No results for input(s): INR, PROTIME in the last 168 hours. Cardiac Enzymes: No results for input(s): CKTOTAL, CKMB, CKMBINDEX, TROPONINI in the last 168 hours. BNP (last 3 results) No results for input(s): PROBNP in the last 8760 hours. HbA1C: No results for input(s): HGBA1C in the last 72 hours. CBG: No results for input(s): GLUCAP in the last 168 hours. Lipid Profile: No results for input(s): CHOL, HDL, LDLCALC, TRIG, CHOLHDL, LDLDIRECT in the last 72 hours. Thyroid Function Tests:  Recent Labs  09/13/16 1750  TSH 1.897   Anemia Panel:  Recent Labs  09/13/16 1750  VITAMINB12 478   Sepsis Labs:  Recent  Labs Lab 09/12/16 1006 09/12/16 1223 09/13/16 0954 09/13/16 1217  LATICACIDVEN 1.72 0.89 1.22 1.13    Recent Results (from the past 240 hour(s))  Blood Culture (routine x 2)     Status: Abnormal (Preliminary result)   Collection Time: 09/12/16  9:44 AM  Result Value Ref Range Status   Specimen Description BLOOD LEFT ARM  Final   Special Requests BOTTLES DRAWN AEROBIC AND ANAEROBIC 5 CC EACH  Final   Culture  Setup Time   Final    GRAM NEGATIVE RODS IN BOTH AEROBIC AND ANAEROBIC BOTTLES Organism ID to follow CRITICAL RESULT CALLED TO, READ BACK BY AND VERIFIED WITH: S MAYNARD,RN @0618  09/13/16 MKELLY,MLT Performed at [facility], [place] [address] [place], [place] [address]    Culture ESCHERICHIA COLI (A)  Final   Report Status PENDING  Incomplete  Blood Culture ID Panel (Reflexed)     Status: Abnormal   Collection Time: 09/12/16  9:44 AM  Result Value Ref Range Status   Enterococcus species NOT DETECTED NOT DETECTED Final   Listeria monocytogenes NOT DETECTED NOT DETECTED Final   Staphylococcus species NOT  DETECTED NOT DETECTED Final   Staphylococcus aureus NOT DETECTED NOT DETECTED Final   Streptococcus species NOT DETECTED NOT DETECTED Final   Streptococcus agalactiae NOT DETECTED NOT DETECTED Final   Streptococcus pneumoniae NOT DETECTED NOT DETECTED Final   Streptococcus pyogenes NOT DETECTED NOT DETECTED Final   Acinetobacter baumannii NOT DETECTED NOT DETECTED Final   Enterobacteriaceae species DETECTED (A) NOT DETECTED Final    Comment: Enterobacteriaceae represent a large family of gram-negative bacteria, not a single organism. CRITICAL RESULT CALLED TO, READ BACK BY AND VERIFIED WITH: S MAYNARD,RN @0618  09/13/16 MKELLY,MLT    Enterobacter cloacae complex NOT DETECTED NOT DETECTED Final   Escherichia coli DETECTED (A) NOT DETECTED Final    Comment: CRITICAL RESULT CALLED TO, READ BACK BY AND VERIFIED WITH: S MAYNARD,RN @0618  09/13/16 MKELLY,MLT    Klebsiella oxytoca NOT DETECTED NOT DETECTED Final   Klebsiella pneumoniae NOT DETECTED NOT DETECTED Final   Proteus species NOT DETECTED NOT DETECTED Final   Serratia marcescens NOT DETECTED NOT DETECTED Final   Carbapenem resistance NOT DETECTED NOT DETECTED Final   Haemophilus influenzae NOT DETECTED NOT DETECTED Final   Neisseria meningitidis NOT DETECTED NOT DETECTED Final   Pseudomonas aeruginosa NOT DETECTED NOT DETECTED Final   Candida albicans NOT DETECTED NOT DETECTED Final   Candida glabrata NOT DETECTED NOT DETECTED Final   Candida krusei NOT DETECTED NOT DETECTED Final   Candida parapsilosis NOT DETECTED NOT DETECTED Final   Candida tropicalis NOT DETECTED NOT DETECTED Final    Comment: Performed at [facility], [address] [place], [place] [address]  Urine culture     Status: Abnormal   Collection Time: 09/12/16 11:27 AM  Result Value Ref Range Status   Specimen Description URINE, CATHETERIZED  Final   Special Requests NONE  Final   Culture (A)  Final    >=100,000 COLONIES/mL ESCHERICHIA COLI Confirmed  Extended Spectrum Beta-Lactamase Producer (ESBL) Performed at [facility], [place] [address], [place], [place] [address]    Report Status 09/14/2016 FINAL  Final   Organism ID, Bacteria ESCHERICHIA COLI (A)  Final      Susceptibility   Escherichia coli - MIC*    AMPICILLIN >=32 RESISTANT Resistant     CEFAZOLIN >=64 RESISTANT Resistant     CEFTRIAXONE >=64 RESISTANT Resistant     CIPROFLOXACIN >=4 RESISTANT Resistant  GENTAMICIN >=16 RESISTANT Resistant     IMIPENEM <=0.25 SENSITIVE Sensitive     NITROFURANTOIN <=16 SENSITIVE Sensitive     TRIMETH/SULFA <=20 SENSITIVE Sensitive     AMPICILLIN/SULBACTAM >=32 RESISTANT Resistant     PIP/TAZO 8 SENSITIVE Sensitive     Extended ESBL POSITIVE Resistant     * >=100,000 COLONIES/mL ESCHERICHIA COLI  Blood Culture (routine x 2)     Status: None (Preliminary result)   Collection Time: 09/12/16 11:46 AM  Result Value Ref Range Status   Specimen Description BLOOD LEFT ARM  Final   Special Requests BOTTLES DRAWN AEROBIC AND ANAEROBIC  UNKNOWN  Final   Culture   Final    NO GROWTH 2 DAYS Performed at [facility], [place] [address] [place], [place] [address]    Report Status PENDING  Incomplete  Blood culture (routine x 2)     Status: None (Preliminary result)   Collection Time: 09/13/16  9:40 AM  Result Value Ref Range Status   Specimen Description BLOOD LEFT ARM  Final   Special Requests BOTTLES DRAWN AEROBIC AND ANAEROBIC 5ML  Final   Culture   Final    NO GROWTH < 24 HOURS Performed at [facility], [place] [address], [place], [place] [address]    Report Status PENDING  Incomplete  Blood culture (routine x 2)     Status: None (Preliminary result)   Collection Time: 09/13/16  9:40 AM  Result Value Ref Range Status   Specimen Description BLOOD LEFT ANTECUBITAL  Final   Special Requests BOTTLES DRAWN AEROBIC AND ANAEROBIC 5ML  Final   Culture   Final    NO GROWTH < 24 HOURS Performed at [facility], [place]  [address], [place], [place] [address]    Report Status PENDING  Incomplete  MRSA PCR Screening     Status: None   Collection Time: 09/13/16  6:50 PM  Result Value Ref Range Status   MRSA by PCR NEGATIVE NEGATIVE Final    Comment:        The GeneXpert MRSA Assay (FDA approved for NASAL specimens only), is one component of a comprehensive MRSA colonization surveillance program. It is not intended to diagnose MRSA infection nor to guide or monitor treatment for MRSA infections.      Radiology Studies: No results found.  Scheduled Meds: . acidophilus  1 capsule Oral Daily  . amLODipine  10 mg Oral Daily  . aspirin  81 mg Oral Daily  . cholecalciferol  2,000 Units Oral Daily  . darifenacin  15 mg Oral Daily  . diclofenac sodium  2 g Topical BID  . donepezil  10 mg Oral BID  . enoxaparin (LOVENOX) injection  40 mg Subcutaneous Q24H  . fluticasone  2 spray Each Nare Daily  . gabapentin  800 mg Oral TID  . losartan  50 mg Oral Daily  . magnesium oxide  200 mg Oral Daily  . meropenem (MERREM) IV  1 g Intravenous Q8H  . metoprolol tartrate  25 mg Oral BID  . mirabegron ER  25 mg Oral Daily  . multivitamin with minerals  1 tablet Oral Daily  . sertraline  50 mg Oral Daily  . simvastatin  10 mg Oral QHS  . sodium chloride flush  3 mL Intravenous Q12H   Continuous Infusions:   LOS: 1 day   Trong Gosling, Orpah Melter, MD [facility] Pager [phone]  If 7PM-7AM, please contact night-coverage [email or web address] Password [email or web address] 09/14/2016, 5:02  PM    

## 2016-09-15 DIAGNOSIS — R7881 Bacteremia: Principal | ICD-10-CM

## 2016-09-15 DIAGNOSIS — R4189 Other symptoms and signs involving cognitive functions and awareness: Secondary | ICD-10-CM

## 2016-09-15 DIAGNOSIS — N390 Urinary tract infection, site not specified: Secondary | ICD-10-CM

## 2016-09-15 DIAGNOSIS — I10 Essential (primary) hypertension: Secondary | ICD-10-CM

## 2016-09-15 DIAGNOSIS — D696 Thrombocytopenia, unspecified: Secondary | ICD-10-CM

## 2016-09-15 DIAGNOSIS — R29898 Other symptoms and signs involving the musculoskeletal system: Secondary | ICD-10-CM | POA: Insufficient documentation

## 2016-09-15 DIAGNOSIS — E559 Vitamin D deficiency, unspecified: Secondary | ICD-10-CM | POA: Insufficient documentation

## 2016-09-15 LAB — CBC
HCT: 41.2 % (ref 36.0–46.0)
HEMOGLOBIN: 13.6 g/dL (ref 12.0–15.0)
MCH: 30.4 pg (ref 26.0–34.0)
MCHC: 33 g/dL (ref 30.0–36.0)
MCV: 92 fL (ref 78.0–100.0)
PLATELETS: 196 10*3/uL (ref 150–400)
RBC: 4.48 MIL/uL (ref 3.87–5.11)
RDW: 14.1 % (ref 11.5–15.5)
WBC: 6.8 10*3/uL (ref 4.0–10.5)

## 2016-09-15 LAB — CULTURE, BLOOD (ROUTINE X 2)

## 2016-09-15 MED ORDER — SULFAMETHOXAZOLE-TRIMETHOPRIM 800-160 MG PO TABS
1.0000 | ORAL_TABLET | Freq: Two times a day (BID) | ORAL | Status: DC
  Administered 2016-09-15 – 2016-09-16 (×3): 1 via ORAL
  Filled 2016-09-15 (×3): qty 1

## 2016-09-15 NOTE — Progress Notes (Signed)
Spoke with Tiffany at [facility] concerning pt going back to [place] with IV ABX.  Pt had selected [facility]) for [facility].  Tiffany asked to be called if pt was discharging on IV ABX [phone] or [phone].  MD informed pt that she would discharge on PO's.  Tiffany is aware.

## 2016-09-15 NOTE — Progress Notes (Signed)
PROGRESS NOTE    RANDAL GERARDOT  [id number] DOB: [birth year] DOA: 09/13/2016 PCP: Jeanmarie Hubert, MD    Brief Narrative:  83 y.o. female with medical history of hypertension, hyperlipidemia, fibromyalgia, cognitive impairment presented with 2 day history of generalized weakness. The patient states that this began when she woke up on the morning of 09/12/2016. She was so weak she had difficulty getting out of bed. She visited the emergency department on the morning of 09/12/2016. Urinalysis showed TNTC WBC, and blood cultures were obtained at that time. The patient was afebrile and hemodynamically stable. She was discharged home from the emergency department in stable condition with a prescription for ciprofloxacin. The patient has not filled the prescription. Blood cultures obtained on 09/12/2016 became positive on the morning of 09/13/2016 growing presumptive Escherichia coli. As a result, the patient was called to come back to the hospital for further evaluation and treatment. She continued to feel generalized weakness. In fact, the patient had a mechanical fall on the evening of 09/12/2016, but did not have syncope or headache.. At baseline, the patient uses a walker to ambulate. The patient has had subjective fevers and chills, but denies chest pain, shortness of breath, sore throat, coughing, hemoptysis, nausea, vomiting, diarrhea, abdominal pain, dysuria, hematuria.  In the emergency department, the patient had a temperature of 99.6F. She was mildly tachycardic in the low 100s. She was hemodynamically stable saturating well on room air. BMP and hepatic enzymes were unremarkable. WBC was 11.1 with platelets 146,000. Lactic acid was 1.22. Chest x-ray on 09/12/2016 was negative for acute findings.  Assessment & Plan:   Active Problems:   Essential hypertension   Hyperlipidemia   Abnormality of gait   Bacteremia   Bacteremia due to Escherichia coli   UTI (urinary tract infection)  Thrombocytopenia (HCC)   Cognitive impairment  ESBL Escherichia coli bacteremia and UTI -Urine culture and blood cultures from 2/18 confirm multidrug resistant ecoli (sensitive to imipenem, nitrofurantoin, zosyn, bactrim) - BCID, blood culture 1 of 2 and urine culture from 2/18 shows MDR Escherichia coli. Second of 2 blood cultures 2/18 and blood cultures 2 from 2/19: Negative to date. - Initially placed on IV ceftriaxone and based upon blood cultures was transitioned to IV meropenem on 2/20. - As per discussion with patient and spouse, she has history of recurrent urinary tract infection, follows with Dr. Evans/urology in [place] and was given a prescription in December 2017 for Bactrim DS 1 tablet daily to be taken indefinitely. However patient did not tolerate this from nausea and vomiting. - Discussed in detail with infectious disease M.D. on call on 2/21 who recommended ideally to switch to oral Bactrim and completed total 10 days treatment and may use antiemetics if has nausea or vomiting. However if she is unable to tolerate oral Bactrim and then will need PICC line and IV ertapenem to complete total 10 days treatment. - Transitioned to oral Bactrim 2/21 PM. Monitor overnight  Essential Hypertension -Continue amlodipine, losartan and metoprolol tartrate - Reasonable inpatient control.  Thrombocytopenia -Likely secondary to infectious process -B12 478 - Resolved.  Generalized weakness -Secondary to infectious process -TSH normal at 1.897 -Pt declines physical therapy eval. We'll try again in a.m.   Hyperlipidemia -Continue statin  Fibromyalgia -The patient takes ibuprofen 400 mg daily for the past 2-3 years-- she has been advised to discuss possible alternatives with her primary care provider -Continue acetaminophen, Voltaren gel and gabapentin  Cognitive impairment -Continue Aricept - stable  DVT prophylaxis: Lovenox subQ  Code Status: Full Family  Communication: Discussed in detail with patient's spouse at bedside. Disposition Plan: Possible d/c home 2/22   Consultants:    none   Procedures:    none  Antimicrobials: Anti-infectives    Start     Dose/Rate Route Frequency Ordered Stop   09/15/16 1430  sulfamethoxazole-trimethoprim (BACTRIM DS,SEPTRA DS) 800-160 MG per tablet 1 tablet     1 tablet Oral 2 times daily 09/15/16 1112 09/24/16 0959   09/14/16 1000  cefTRIAXone (ROCEPHIN) 2 g in dextrose 5 % 50 mL IVPB  Status:  Discontinued     2 g 100 mL/hr over 30 Minutes Intravenous Every 24 hours 09/13/16 1736 09/14/16 0812   09/14/16 1000  meropenem (MERREM) 1 g in sodium chloride 0.9 % 100 mL IVPB  Status:  Discontinued     1 g 200 mL/hr over 30 Minutes Intravenous Every 8 hours 09/14/16 0904 09/15/16 1112   09/13/16 0915  cefTRIAXone (ROCEPHIN) 2 g in dextrose 5 % 50 mL IVPB     2 g 100 mL/hr over 30 Minutes Intravenous  Once 09/13/16 0904 09/13/16 1118      Subjective: Denies dysuria or urinary frequency. Upset that bed alarm is on but per nursing, she is unsteady. No other complaints reported.  Objective: Vitals:   09/14/16 1354 09/14/16 2106 09/15/16 0657 09/15/16 1500  BP: 131/77 (!) 155/85 136/84 (!) 152/78  Pulse: 73 88 86 69  Resp: 17 18 18 20   Temp: 98.8 F (37.1 C) 98.8 F (37.1 C) 98.6 F (37 C) 98.2 F (36.8 C)  TempSrc: Oral Oral Oral Oral  SpO2: 94% 96% 97% 92%  Weight:      Height:        Intake/Output Summary (Last 24 hours) at 09/15/16 1740 Last data filed at 09/15/16 0700  Gross per 24 hour  Intake              540 ml  Output                0 ml  Net              540 ml   Filed Weights   09/13/16 1739  Weight: 84.9 kg (187 lb 2.7 oz)    Examination:  General exam: Pleasant elderly female lying comfortably propped up in bed. Does not look septic or toxic. Spouse at bedside.  Respiratory system: Clear to auscultation. Respiratory effort normal. Cardiovascular system: S1 & S2 heard,  RRR. No JVD, murmurs or pedal edema. Telemetry: Sinus rhythm. Gastrointestinal system: Abdomen is nondistended, soft and nontender. No organomegaly or masses felt. Normal bowel sounds heard. Central nervous system: Alert and oriented. No focal neurological deficits. Extremities: Symmetric 5 x 5 power. Skin: No rashes, lesions Psychiatry: Judgement and insight appear slightly impaired. Mood & affect appropriate.   Data Reviewed: I have personally reviewed following labs and imaging studies  CBC:  Recent Labs Lab 09/12/16 0915 09/13/16 0940 09/15/16 0647  WBC 10.0 11.1* 6.8  NEUTROABS 7.9* 9.1*  --   HGB 14.5 13.9 13.6  HCT 43.4 42.1 41.2  MCV 91.9 93.3 92.0  PLT 160 146* [id number]   Basic Metabolic Panel:  Recent Labs Lab 09/12/16 0915 09/13/16 0940 09/14/16 0513  NA 139 137 139  K 3.6 3.5 3.6  CL 104 103 107  CO2 28 27 26   GLUCOSE 125* 129* 116*  BUN 11 11 9   CREATININE 0.63 0.56 0.58  CALCIUM 9.1 9.1 8.4*  MG  --   --  2.1   GFR: Estimated Creatinine Clearance: 57.6 mL/min (by C-G formula based on SCr of 0.58 mg/dL). Liver Function Tests:  Recent Labs Lab 09/12/16 0915 09/13/16 0940 09/14/16 0513  AST 21 23 23   ALT 15 21 22   ALKPHOS 57 62 53  BILITOT 0.9 0.8 0.4  PROT 6.5 6.8 6.2*  ALBUMIN 3.6 3.7 3.1*   No results for input(s): LIPASE, AMYLASE in the last 168 hours. No results for input(s): AMMONIA in the last 168 hours. Coagulation Profile: No results for input(s): INR, PROTIME in the last 168 hours. Cardiac Enzymes: No results for input(s): CKTOTAL, CKMB, CKMBINDEX, TROPONINI in the last 168 hours. BNP (last 3 results) No results for input(s): PROBNP in the last 8760 hours. HbA1C: No results for input(s): HGBA1C in the last 72 hours. CBG: No results for input(s): GLUCAP in the last 168 hours. Lipid Profile: No results for input(s): CHOL, HDL, LDLCALC, TRIG, CHOLHDL, LDLDIRECT in the last 72 hours. Thyroid Function Tests:  Recent Labs   09/13/16 1750  TSH 1.897   Anemia Panel:  Recent Labs  09/13/16 1750  VITAMINB12 478   Sepsis Labs:  Recent Labs Lab 09/12/16 1006 09/12/16 1223 09/13/16 0954 09/13/16 1217  LATICACIDVEN 1.72 0.89 1.22 1.13    Recent Results (from the past 240 hour(s))  Blood Culture (routine x 2)     Status: Abnormal   Collection Time: 09/12/16  9:44 AM  Result Value Ref Range Status   Specimen Description BLOOD LEFT ARM  Final   Special Requests BOTTLES DRAWN AEROBIC AND ANAEROBIC 5 CC EACH  Final   Culture  Setup Time   Final    GRAM NEGATIVE RODS IN BOTH AEROBIC AND ANAEROBIC BOTTLES Organism ID to follow CRITICAL RESULT CALLED TO, READ BACK BY AND VERIFIED WITH: S MAYNARD,RN @0618  09/13/16 MKELLY,MLT    Culture (A)  Final    ESCHERICHIA COLI Confirmed Extended Spectrum Beta-Lactamase Producer (ESBL) Performed at [facility], [place] [address] [place], [place] [address]    Report Status 09/15/2016 FINAL  Final   Organism ID, Bacteria ESCHERICHIA COLI  Final      Susceptibility   Escherichia coli - MIC*    AMPICILLIN >=32 RESISTANT Resistant     CEFAZOLIN >=64 RESISTANT Resistant     CEFEPIME >=64 RESISTANT Resistant     CEFTAZIDIME RESISTANT Resistant     CEFTRIAXONE >=64 RESISTANT Resistant     CIPROFLOXACIN >=4 RESISTANT Resistant     GENTAMICIN >=16 RESISTANT Resistant     IMIPENEM <=0.25 SENSITIVE Sensitive     TRIMETH/SULFA <=20 SENSITIVE Sensitive     AMPICILLIN/SULBACTAM >=32 RESISTANT Resistant     PIP/TAZO 8 SENSITIVE Sensitive     Extended ESBL POSITIVE Resistant     * ESCHERICHIA COLI  Blood Culture ID Panel (Reflexed)     Status: Abnormal   Collection Time: 09/12/16  9:44 AM  Result Value Ref Range Status   Enterococcus species NOT DETECTED NOT DETECTED Final   Listeria monocytogenes NOT DETECTED NOT DETECTED Final   Staphylococcus species NOT DETECTED NOT DETECTED Final   Staphylococcus aureus NOT DETECTED NOT DETECTED Final   Streptococcus species  NOT DETECTED NOT DETECTED Final   Streptococcus agalactiae NOT DETECTED NOT DETECTED Final   Streptococcus pneumoniae NOT DETECTED NOT DETECTED Final   Streptococcus pyogenes NOT DETECTED NOT DETECTED Final   Acinetobacter baumannii NOT DETECTED NOT DETECTED Final   Enterobacteriaceae species DETECTED (A) NOT DETECTED Final    Comment: Enterobacteriaceae represent a large family  of gram-negative bacteria, not a single organism. CRITICAL RESULT CALLED TO, READ BACK BY AND VERIFIED WITH: S MAYNARD,RN @0618  09/13/16 MKELLY,MLT    Enterobacter cloacae complex NOT DETECTED NOT DETECTED Final   Escherichia coli DETECTED (A) NOT DETECTED Final    Comment: CRITICAL RESULT CALLED TO, READ BACK BY AND VERIFIED WITH: S MAYNARD,RN @0618  09/13/16 MKELLY,MLT    Klebsiella oxytoca NOT DETECTED NOT DETECTED Final   Klebsiella pneumoniae NOT DETECTED NOT DETECTED Final   Proteus species NOT DETECTED NOT DETECTED Final   Serratia marcescens NOT DETECTED NOT DETECTED Final   Carbapenem resistance NOT DETECTED NOT DETECTED Final   Haemophilus influenzae NOT DETECTED NOT DETECTED Final   Neisseria meningitidis NOT DETECTED NOT DETECTED Final   Pseudomonas aeruginosa NOT DETECTED NOT DETECTED Final   Candida albicans NOT DETECTED NOT DETECTED Final   Candida glabrata NOT DETECTED NOT DETECTED Final   Candida krusei NOT DETECTED NOT DETECTED Final   Candida parapsilosis NOT DETECTED NOT DETECTED Final   Candida tropicalis NOT DETECTED NOT DETECTED Final    Comment: Performed at [facility], [address], [place], [place] [address]  Urine culture     Status: Abnormal   Collection Time: 09/12/16 11:27 AM  Result Value Ref Range Status   Specimen Description URINE, CATHETERIZED  Final   Special Requests NONE  Final   Culture (A)  Final    >=100,000 COLONIES/mL ESCHERICHIA COLI Confirmed Extended Spectrum Beta-Lactamase Producer (ESBL) Performed at [facility], [place] [address],  [place], [place] [address]    Report Status 09/14/2016 FINAL  Final   Organism ID, Bacteria ESCHERICHIA COLI (A)  Final      Susceptibility   Escherichia coli - MIC*    AMPICILLIN >=32 RESISTANT Resistant     CEFAZOLIN >=64 RESISTANT Resistant     CEFTRIAXONE >=64 RESISTANT Resistant     CIPROFLOXACIN >=4 RESISTANT Resistant     GENTAMICIN >=16 RESISTANT Resistant     IMIPENEM <=0.25 SENSITIVE Sensitive     NITROFURANTOIN <=16 SENSITIVE Sensitive     TRIMETH/SULFA <=20 SENSITIVE Sensitive     AMPICILLIN/SULBACTAM >=32 RESISTANT Resistant     PIP/TAZO 8 SENSITIVE Sensitive     Extended ESBL POSITIVE Resistant     * >=100,000 COLONIES/mL ESCHERICHIA COLI  Blood Culture (routine x 2)     Status: None (Preliminary result)   Collection Time: 09/12/16 11:46 AM  Result Value Ref Range Status   Specimen Description BLOOD LEFT ARM  Final   Special Requests BOTTLES DRAWN AEROBIC AND ANAEROBIC  UNKNOWN  Final   Culture   Final    NO GROWTH 3 DAYS Performed at [facility], [address] [place], [place] [address]    Report Status PENDING  Incomplete  Blood culture (routine x 2)     Status: None (Preliminary result)   Collection Time: 09/13/16  9:40 AM  Result Value Ref Range Status   Specimen Description BLOOD LEFT ARM  Final   Special Requests BOTTLES DRAWN AEROBIC AND ANAEROBIC 5ML  Final   Culture   Final    NO GROWTH 2 DAYS Performed at [facility], [place] [address] [place], [place] [address]    Report Status PENDING  Incomplete  Blood culture (routine x 2)     Status: None (Preliminary result)   Collection Time: 09/13/16  9:40 AM  Result Value Ref Range Status   Specimen Description BLOOD LEFT ANTECUBITAL  Final   Special Requests  BOTTLES DRAWN AEROBIC AND ANAEROBIC 5ML  Final   Culture   Final    NO GROWTH 2 DAYS Performed at [facility], [place] [address], [place], [place] [address]    Report Status PENDING  Incomplete  MRSA PCR Screening     Status: None    Collection Time: 09/13/16  6:50 PM  Result Value Ref Range Status   MRSA by PCR NEGATIVE NEGATIVE Final    Comment:        The GeneXpert MRSA Assay (FDA approved for NASAL specimens only), is one component of a comprehensive MRSA colonization surveillance program. It is not intended to diagnose MRSA infection nor to guide or monitor treatment for MRSA infections.      Radiology Studies: No results found.  Scheduled Meds: . acidophilus  1 capsule Oral Daily  . amLODipine  10 mg Oral Daily  . aspirin  81 mg Oral Daily  . cholecalciferol  2,000 Units Oral Daily  . darifenacin  15 mg Oral Daily  . diclofenac sodium  2 g Topical BID  . donepezil  10 mg Oral BID  . enoxaparin (LOVENOX) injection  40 mg Subcutaneous Q24H  . fluticasone  2 spray Each Nare Daily  . gabapentin  800 mg Oral TID  . losartan  50 mg Oral Daily  . magnesium oxide  200 mg Oral Daily  . metoprolol tartrate  25 mg Oral BID  . mirabegron ER  25 mg Oral Daily  . multivitamin with minerals  1 tablet Oral Daily  . sertraline  50 mg Oral Daily  . simvastatin  10 mg Oral QHS  . sodium chloride flush  3 mL Intravenous Q12H  . sulfamethoxazole-trimethoprim  1 tablet Oral BID   Continuous Infusions:   LOS: 2 days   Chrissie Dacquisto, MD, FACP, FHM. [facility] Pager [phone]  If 7PM-7AM, please contact night-coverage [email or web address] Password [email or web address] 09/15/2016, 5:50 PM

## 2016-09-15 NOTE — Care Management Note (Signed)
Case Management Note  Patient Details  Name: Kristie [name] MRN: [id number] Date of Birth: [birth year]  Subjective/Objective:  Pt present to the ED with elevated temperature and mild tachycardia.                  Action/Plan:Pt plan to discharge home with husband at present time.    Expected Discharge Date:  09/16/2016               Expected Discharge Plan:  Home/Self Care  In-House Referral:  NA  Discharge planning Services  CM Consult  Post Acute Care Choice:  NA Choice offered to:  NA  DME Arranged:  N/A DME Agency:  NA  HH Arranged:  NA HH Agency:  NA  Status of Service:  In process, will continue to follow  If discussed at Long Length of Stay Meetings, dates discussed:    Additional CommentsPurcell Mouton, RN 09/15/2016, 2:48 PM

## 2016-09-15 NOTE — Progress Notes (Deleted)
Office Visit Note  Patient: Kristie [name]             Date of Birth: July 16, 1929           MRN: [id number]             PCP: Jeanmarie Hubert, MD Referring: No ref. provider found Visit Date: 09/16/2016 Occupation: @GUAROCC @    Subjective:  No chief complaint on file.   History of Present Illness: Kristie [name] is a 83 y.o. female ***   Activities of Daily Living:  Patient reports morning stiffness for *** {minute/hour:19697}.   Patient {ACTIONS;DENIES/REPORTS:21021675::"Denies"} nocturnal pain.  Difficulty dressing/grooming: {ACTIONS;DENIES/REPORTS:21021675::"Denies"} Difficulty climbing stairs: {ACTIONS;DENIES/REPORTS:21021675::"Denies"} Difficulty getting out of chair: {ACTIONS;DENIES/REPORTS:21021675::"Denies"} Difficulty using hands for taps, buttons, cutlery, and/or writing: {ACTIONS;DENIES/REPORTS:21021675::"Denies"}   No Rheumatology ROS completed.   PMFS History:  Patient Active Problem List   Diagnosis Date Noted  . Bacteremia 09/13/2016  . Bacteremia due to Escherichia coli 09/13/2016  . UTI (urinary tract infection) 09/13/2016  . Thrombocytopenia ([place]) 09/13/2016  . Cognitive impairment 09/13/2016  . Depression 08/05/2016  . Memory deficit 08/05/2016  . Spinal stenosis of lumbar region 04/25/2014  . Fibromyalgia 04/25/2014  . Urine incontinence 04/25/2014  . Senile osteoporosis 04/25/2014  . Insomnia 04/25/2014  . Right foot drop 04/27/2013  . Abnormality of gait 04/27/2013  . Weakness   . Nausea 04/15/2012  . Hyperlipidemia 04/14/2012  . Pubic bone fracture ([place]) 04/14/2012  . Hypokalemia 04/14/2012  . Essential hypertension 08/18/2007  . Osteoarthritis 08/18/2007  . Foot drop, right foot 08/18/2007  . OTHER ACQUIRED DEFORMITY OF ANKLE AND FOOT OTHER 08/18/2007  . UNEQUAL LEG LENGTH 08/18/2007  . COLONIC POLYPS, HX OF 08/18/2007    Past Medical History:  Diagnosis Date  . Cataract   . Fibromyalgia 04/25/2014  . Hyperlipidemia   . Hypertension   .  Lesion of sciatic nerve   . Low back pain   . Memory loss   . Spinal stenosis of lumbar region 04/25/2014  . Tremor   . Weakness     Family History  Problem Relation Age of Onset  . Breast cancer Mother   . Heart disease Mother   . Dementia Father   . Cancer Sister     Esophageal    Past Surgical History:  Procedure Laterality Date  . ABDOMINAL HYSTERECTOMY  1963  . APPENDECTOMY    . CATARACT EXTRACTION W/ INTRAOCULAR LENS  IMPLANT, BILATERAL  2005 and 2007  . COLON SURGERY  1974   colon polyp  . COLON SURGERY  2002   colon polyps(2)  Dr Collene Mares  . COLONOSCOPY W/ POLYPECTOMY  1974and 2002  . EYE SURGERY  2005/2007   cataract renoval  Dr. Bing Plume  . KNEE SURGERY  2009   Dr. Ronnie Derby  . LUMBAR LAMINECTOMY  2012   L4-5 with spinous process fixation (06/22/11) Dr. Tommi Emery  . schwanoma  1987   residual right foot drop and right leg pain. Dr. Deirdre Pippins  . SMALL INTESTINE SURGERY  1990   bowel obstruction, Dr. Rebekah Chesterfield  . TONSILLECTOMY  1940   Social History   Social History Narrative   Patient is retired Public relations account executive and lives with her husband, (married since 1980) at [place] since 2013   Patient  is right handed. She drinks 1 cup of coffee per day.    She does not have any pets.   Former smoker for 20 years   Exercise none  Objective: Vital Signs: There were no vitals taken for this visit.   Physical Exam   Musculoskeletal Exam: ***  CDAI Exam: No CDAI exam completed.    Investigation: Findings:  Labs from 12/31/2015 show CBC with diff is normal except for hemoglobin elevated at 16.3.  Hematocrit elevated at 49.9.  CMP with glucose is elevated at 109.  Vitamin D is at 27 and she is taking 2000 international units every other day.  We advised the patient to take it once a day and that should improve her vitamin D.  We will check it in the future.  Hemoglobin A1C is 5.4.  Lipid panel shows elevated cholesterol at 254, elevated LDL  at 155.  TSH is normal at 2.24.  Urine is normal.   No TB Gold, No High Risk Medication   Admission on 09/13/2016  Component Date Value Ref Range Status  . Specimen Description 09/13/2016 BLOOD LEFT ARM   Final  . Special Requests 09/13/2016 BOTTLES DRAWN AEROBIC AND ANAEROBIC 5ML   Final  . Culture 09/13/2016    Final                   Value:NO GROWTH 2 DAYS Performed at [facility], [place] [address], [place], [place] [address]   . Report Status 09/13/2016 PENDING   Incomplete  . Specimen Description 09/13/2016 BLOOD LEFT ANTECUBITAL   Final  . Special Requests 09/13/2016 BOTTLES DRAWN AEROBIC AND ANAEROBIC 5ML   Final  . Culture 09/13/2016    Final                   Value:NO GROWTH 2 DAYS Performed at [facility], [place] [address], [place], [place] [address]   . Report Status 09/13/2016 PENDING   Incomplete  . WBC 09/13/2016 11.1* 4.0 - 10.5 K/uL Final  . RBC 09/13/2016 4.51  3.87 - 5.11 MIL/uL Final  . Hemoglobin 09/13/2016 13.9  12.0 - 15.0 g/dL Final  . HCT 09/13/2016 42.1  36.0 - 46.0 % Final  . MCV 09/13/2016 93.3  78.0 - 100.0 fL Final  . MCH 09/13/2016 30.8  26.0 - 34.0 pg Final  . MCHC 09/13/2016 33.0  30.0 - 36.0 g/dL Final  . RDW 09/13/2016 14.3  11.5 - 15.5 % Final  . Platelets 09/13/2016 146* 150 - 400 K/uL Final  . Neutrophils Relative % 09/13/2016 82  % Final  . Neutro Abs 09/13/2016 9.1* 1.7 - 7.7 K/uL Final  . Lymphocytes Relative 09/13/2016 8  % Final  . Lymphs Abs 09/13/2016 0.9  0.7 - 4.0 K/uL Final  . Monocytes Relative 09/13/2016 10  % Final  . Monocytes Absolute 09/13/2016 1.1* 0.1 - 1.0 K/uL Final  . Eosinophils Relative 09/13/2016 0  % Final  . Eosinophils Absolute 09/13/2016 0.0  0.0 - 0.7 K/uL Final  . Basophils Relative 09/13/2016 0  % Final  . Basophils Absolute 09/13/2016 0.0  0.0 - 0.1 K/uL Final  . Sodium 09/13/2016 137  135 - 145 mmol/L Final  . Potassium 09/13/2016 3.5  3.5 - 5.1 mmol/L Final  . Chloride 09/13/2016 103  101 - 111  mmol/L Final  . CO2 09/13/2016 27  22 - 32 mmol/L Final  . Glucose, Bld 09/13/2016 129* 65 - 99 mg/dL Final  . BUN 09/13/2016 11  6 - 20 mg/dL Final  . Creatinine, Ser 09/13/2016 0.56  0.44 - 1.00 mg/dL Final  . Calcium 09/13/2016 9.1  8.9 - 10.3 mg/dL Final  .  Total Protein 09/13/2016 6.8  6.5 - 8.1 g/dL Final  . Albumin 09/13/2016 3.7  3.5 - 5.0 g/dL Final  . AST 09/13/2016 23  15 - 41 U/L Final  . ALT 09/13/2016 21  14 - 54 U/L Final  . Alkaline Phosphatase 09/13/2016 62  38 - 126 U/L Final  . Total Bilirubin 09/13/2016 0.8  0.3 - 1.2 mg/dL Final  . GFR calc non Af Amer 09/13/2016 >60  >60 mL/min Final  . GFR calc Af Amer 09/13/2016 >60  >60 mL/min Final   Comment: (NOTE) The eGFR has been calculated using the CKD EPI equation. This calculation has not been validated in all clinical situations. eGFR's persistently <60 mL/min signify possible Chronic Kidney Disease.   . Anion gap 09/13/2016 7  5 - 15 Final  . Lactic Acid, Venous 09/13/2016 1.22  0.5 - 1.9 mmol/L Final  . Lactic Acid, Venous 09/13/2016 1.13  0.5 - 1.9 mmol/L Final  . Sodium 09/14/2016 139  135 - 145 mmol/L Final  . Potassium 09/14/2016 3.6  3.5 - 5.1 mmol/L Final  . Chloride 09/14/2016 107  101 - 111 mmol/L Final  . CO2 09/14/2016 26  22 - 32 mmol/L Final  . Glucose, Bld 09/14/2016 116* 65 - 99 mg/dL Final  . BUN 09/14/2016 9  6 - 20 mg/dL Final  . Creatinine, Ser 09/14/2016 0.58  0.44 - 1.00 mg/dL Final  . Calcium 09/14/2016 8.4* 8.9 - 10.3 mg/dL Final  . Total Protein 09/14/2016 6.2* 6.5 - 8.1 g/dL Final  . Albumin 09/14/2016 3.1* 3.5 - 5.0 g/dL Final  . AST 09/14/2016 23  15 - 41 U/L Final  . ALT 09/14/2016 22  14 - 54 U/L Final  . Alkaline Phosphatase 09/14/2016 53  38 - 126 U/L Final  . Total Bilirubin 09/14/2016 0.4  0.3 - 1.2 mg/dL Final  . GFR calc non Af Amer 09/14/2016 >60  >60 mL/min Final  . GFR calc Af Amer 09/14/2016 >60  >60 mL/min Final   Comment: (NOTE) The eGFR has been calculated using the  CKD EPI equation. This calculation has not been validated in all clinical situations. eGFR's persistently <60 mL/min signify possible Chronic Kidney Disease.   . Anion gap 09/14/2016 6  5 - 15 Final  . TSH 09/13/2016 1.897  0.350 - 4.500 uIU/mL Final  . Vitamin B-12 09/13/2016 478  180 - 914 pg/mL Final   Comment: (NOTE) This assay is not validated for testing neonatal or myeloproliferative syndrome specimens for Vitamin B12 levels. Performed at [facility], [place] [address] [place], [place] [address]   . Magnesium 09/14/2016 2.1  1.7 - 2.4 mg/dL Final  . MRSA by PCR 09/13/2016 NEGATIVE  NEGATIVE Final   Comment:        The GeneXpert MRSA Assay (FDA approved for NASAL specimens only), is one component of a comprehensive MRSA colonization surveillance program. It is not intended to diagnose MRSA infection nor to guide or monitor treatment for MRSA infections.   . WBC 09/15/2016 6.8  4.0 - 10.5 K/uL Final  . RBC 09/15/2016 4.48  3.87 - 5.11 MIL/uL Final  . Hemoglobin 09/15/2016 13.6  12.0 - 15.0 g/dL Final  . HCT 09/15/2016 41.2  36.0 - 46.0 % Final  . MCV 09/15/2016 92.0  78.0 - 100.0 fL Final  . MCH 09/15/2016 30.4  26.0 - 34.0 pg Final  . MCHC 09/15/2016 33.0  30.0 - 36.0 g/dL Final  . RDW 09/15/2016 14.1  11.5 -  15.5 % Final  . Platelets 09/15/2016 196  150 - 400 K/uL Final  Admission on 09/12/2016, Discharged on 09/12/2016  Component Date Value Ref Range Status  . Lactic Acid, Venous 09/12/2016 1.72  0.5 - 1.9 mmol/L Final  . Sodium 09/12/2016 139  135 - 145 mmol/L Final  . Potassium 09/12/2016 3.6  3.5 - 5.1 mmol/L Final  . Chloride 09/12/2016 104  101 - 111 mmol/L Final  . CO2 09/12/2016 28  22 - 32 mmol/L Final  . Glucose, Bld 09/12/2016 125* 65 - 99 mg/dL Final  . BUN 09/12/2016 11  6 - 20 mg/dL Final  . Creatinine, Ser 09/12/2016 0.63  0.44 - 1.00 mg/dL Final  . Calcium 09/12/2016 9.1  8.9 - 10.3 mg/dL Final  . Total Protein 09/12/2016 6.5  6.5 - 8.1 g/dL  Final  . Albumin 09/12/2016 3.6  3.5 - 5.0 g/dL Final  . AST 09/12/2016 21  15 - 41 U/L Final  . ALT 09/12/2016 15  14 - 54 U/L Final  . Alkaline Phosphatase 09/12/2016 57  38 - 126 U/L Final  . Total Bilirubin 09/12/2016 0.9  0.3 - 1.2 mg/dL Final  . GFR calc non Af Amer 09/12/2016 >60  >60 mL/min Final  . GFR calc Af Amer 09/12/2016 >60  >60 mL/min Final   Comment: (NOTE) The eGFR has been calculated using the CKD EPI equation. This calculation has not been validated in all clinical situations. eGFR's persistently <60 mL/min signify possible Chronic Kidney Disease.   . Anion gap 09/12/2016 7  5 - 15 Final  . WBC 09/12/2016 10.0  4.0 - 10.5 K/uL Final  . RBC 09/12/2016 4.72  3.87 - 5.11 MIL/uL Final  . Hemoglobin 09/12/2016 14.5  12.0 - 15.0 g/dL Final  . HCT 09/12/2016 43.4  36.0 - 46.0 % Final  . MCV 09/12/2016 91.9  78.0 - 100.0 fL Final  . MCH 09/12/2016 30.7  26.0 - 34.0 pg Final  . MCHC 09/12/2016 33.4  30.0 - 36.0 g/dL Final  . RDW 09/12/2016 14.0  11.5 - 15.5 % Final  . Platelets 09/12/2016 160  150 - 400 K/uL Final  . Neutrophils Relative % 09/12/2016 79  % Final  . Neutro Abs 09/12/2016 7.9* 1.7 - 7.7 K/uL Final  . Lymphocytes Relative 09/12/2016 8  % Final  . Lymphs Abs 09/12/2016 0.8  0.7 - 4.0 K/uL Final  . Monocytes Relative 09/12/2016 13  % Final  . Monocytes Absolute 09/12/2016 1.3* 0.1 - 1.0 K/uL Final  . Eosinophils Relative 09/12/2016 0  % Final  . Eosinophils Absolute 09/12/2016 0.0  0.0 - 0.7 K/uL Final  . Basophils Relative 09/12/2016 0  % Final  . Basophils Absolute 09/12/2016 0.0  0.0 - 0.1 K/uL Final  . Specimen Description 09/12/2016 BLOOD LEFT ARM   Final  . Special Requests 09/12/2016 BOTTLES DRAWN AEROBIC AND ANAEROBIC  UNKNOWN   Final  . Culture 09/12/2016    Final                   Value:NO GROWTH 3 DAYS Performed at [facility], [place] [address], [place], [place] [address]   . Report Status 09/12/2016 PENDING   Incomplete  . Specimen  Description 09/12/2016 BLOOD LEFT ARM   Final  . Special Requests 09/12/2016 BOTTLES DRAWN AEROBIC AND ANAEROBIC 5 CC EACH   Final  . Culture  Setup Time 09/12/2016    Final  Value:GRAM NEGATIVE RODS IN BOTH AEROBIC AND ANAEROBIC BOTTLES Organism ID to follow CRITICAL RESULT CALLED TO, READ BACK BY AND VERIFIED WITH: S MAYNARD,RN @0618  09/13/16 MKELLY,MLT   . Culture 09/12/2016 *  Final                   Value:ESCHERICHIA COLI Confirmed Extended Spectrum Beta-Lactamase Producer (ESBL) Performed at [facility], [place] [address], [place], [place] [address]   . Report Status 09/12/2016 09/15/2016 FINAL   Final  . Organism ID, Bacteria 09/12/2016 ESCHERICHIA COLI   Final  . Color, Urine 09/12/2016 YELLOW  YELLOW Final  . APPearance 09/12/2016 HAZY* CLEAR Final  . Specific Gravity, Urine 09/12/2016 1.006  1.005 - 1.030 Final  . pH 09/12/2016 6.0  5.0 - 8.0 Final  . Glucose, UA 09/12/2016 NEGATIVE  NEGATIVE mg/dL Final  . Hgb urine dipstick 09/12/2016 SMALL* NEGATIVE Final  . Bilirubin Urine 09/12/2016 NEGATIVE  NEGATIVE Final  . Ketones, ur 09/12/2016 NEGATIVE  NEGATIVE mg/dL Final  . Protein, ur 09/12/2016 NEGATIVE  NEGATIVE mg/dL Final  . Nitrite 09/12/2016 POSITIVE* NEGATIVE Final  . Leukocytes, UA 09/12/2016 LARGE* NEGATIVE Final  . RBC / HPF 09/12/2016 0-5  0 - 5 RBC/hpf Final  . WBC, UA 09/12/2016 TOO NUMEROUS TO COUNT  0 - 5 WBC/hpf Final  . Bacteria, UA 09/12/2016 MANY* NONE SEEN Final  . Squamous Epithelial / LPF 09/12/2016 NONE SEEN  NONE SEEN Final  . Mucous 09/12/2016 PRESENT   Final  . Specimen Description 09/12/2016 URINE, CATHETERIZED   Final  . Special Requests 09/12/2016 NONE   Final  . Culture 09/12/2016 *  Final                   Value:>=100,000 COLONIES/mL ESCHERICHIA COLI Confirmed Extended Spectrum Beta-Lactamase Producer (ESBL) Performed at [facility], [place] [address], [place], [place] [address]   . Report Status 09/12/2016  09/14/2016 FINAL   Final  . Organism ID, Bacteria 09/12/2016 ESCHERICHIA COLI*  Final  . Lactic Acid, Venous 09/12/2016 0.89  0.5 - 1.9 mmol/L Final  . Enterococcus species 09/12/2016 NOT DETECTED  NOT DETECTED Final  . Listeria monocytogenes 09/12/2016 NOT DETECTED  NOT DETECTED Final  . Staphylococcus species 09/12/2016 NOT DETECTED  NOT DETECTED Final  . Staphylococcus aureus 09/12/2016 NOT DETECTED  NOT DETECTED Final  . Streptococcus species 09/12/2016 NOT DETECTED  NOT DETECTED Final  . Streptococcus agalactiae 09/12/2016 NOT DETECTED  NOT DETECTED Final  . Streptococcus pneumoniae 09/12/2016 NOT DETECTED  NOT DETECTED Final  . Streptococcus pyogenes 09/12/2016 NOT DETECTED  NOT DETECTED Final  . Acinetobacter baumannii 09/12/2016 NOT DETECTED  NOT DETECTED Final  . Enterobacteriaceae species 09/12/2016 DETECTED* NOT DETECTED Final   Comment: Enterobacteriaceae represent a large family of gram-negative bacteria, not a single organism. CRITICAL RESULT CALLED TO, READ BACK BY AND VERIFIED WITH: S MAYNARD,RN @0618  09/13/16 MKELLY,MLT   . Enterobacter cloacae complex 09/12/2016 NOT DETECTED  NOT DETECTED Final  . Escherichia coli 09/12/2016 DETECTED* NOT DETECTED Final   Comment: CRITICAL RESULT CALLED TO, READ BACK BY AND VERIFIED WITH: S MAYNARD,RN @0618  09/13/16 MKELLY,MLT   . Klebsiella oxytoca 09/12/2016 NOT DETECTED  NOT DETECTED Final  . Klebsiella pneumoniae 09/12/2016 NOT DETECTED  NOT DETECTED Final  . Proteus species 09/12/2016 NOT DETECTED  NOT DETECTED Final  . Serratia marcescens 09/12/2016 NOT DETECTED  NOT DETECTED Final  . Carbapenem resistance 09/12/2016 NOT DETECTED  NOT DETECTED Final  . Haemophilus influenzae 09/12/2016 NOT DETECTED  NOT DETECTED Final  . Neisseria meningitidis 09/12/2016 NOT DETECTED  NOT DETECTED Final  . Pseudomonas aeruginosa 09/12/2016 NOT DETECTED  NOT DETECTED Final  . Candida albicans 09/12/2016 NOT DETECTED  NOT DETECTED Final  . Candida  glabrata 09/12/2016 NOT DETECTED  NOT DETECTED Final  . Candida krusei 09/12/2016 NOT DETECTED  NOT DETECTED Final  . Candida parapsilosis 09/12/2016 NOT DETECTED  NOT DETECTED Final  . Candida tropicalis 09/12/2016 NOT DETECTED  NOT DETECTED Final  Telephone on 08/18/2016  Component Date Value Ref Range Status  . Hemoglobin 08/19/2016 15.7  12.0 - 16.0 g/dL Final  . HCT 08/19/2016 46  36 - 46 % Final  . Platelets 08/19/2016 225  150 - 399 K/L Final  . WBC 08/19/2016 6.2  10^3/mL Final  . Glucose 08/19/2016 108  mg/dL Final  . BUN 08/19/2016 12  4 - 21 mg/dL Final  . Creatinine 08/19/2016 0.7  0.5 - 1.1 mg/dL Final  . Potassium 08/19/2016 42.0* 3.4 - 5.3 mmol/L Final  . Sodium 08/19/2016 139  137 - 147 mmol/L Final  . Alkaline Phosphatase 08/19/2016 62  25 - 125 U/L Final  . ALT 08/19/2016 11  7 - 35 U/L Final  . AST 08/19/2016 16  13 - 35 U/L Final  . Bilirubin, Total 08/19/2016 0.6  mg/dL Final      Imaging: Dg Chest 2 View  Result Date: 09/12/2016 CLINICAL DATA:  Weakness for 2 days. Ex-smoker with history of hypertension. EXAM: CHEST  2 VIEW COMPARISON:  Portable chest 05/05/2010. FINDINGS: The heart size and mediastinal contours are stable. There is mild aortic tortuosity. The lungs are clear. There is no pleural effusion or pneumothorax. No acute osseous findings are evident. Telemetry leads overlie the chest. IMPRESSION: Stable chest.  No active cardiopulmonary process. Electronically Signed   By: Richardean Sale M.D.   On: 09/12/2016 10:17    Speciality Comments: No specialty comments available.    Procedures:  No procedures performed Allergies: Bactrim [sulfamethoxazole-trimethoprim] and Restoril [temazepam]   Assessment / Plan:     Visit Diagnoses: No diagnosis found.    Orders: No orders of the defined types were placed in this encounter.  No orders of the defined types were placed in this encounter.   Face-to-face time spent with patient was *** minutes.  50% of time was spent in counseling and coordination of care.  Follow-Up Instructions: No Follow-up on file.   Jennessa Trigo, [place]  Note - This record has been created using [employer].  Chart creation errors have been sought, but may not always  have been located. Such creation errors do not reflect on  the standard of medical care.

## 2016-09-16 ENCOUNTER — Ambulatory Visit: Payer: Self-pay | Admitting: Rheumatology

## 2016-09-16 MED ORDER — SULFAMETHOXAZOLE-TRIMETHOPRIM 800-160 MG PO TABS: 1.0000 | ORAL_TABLET | Freq: Two times a day (BID) | ORAL | Status: DC

## 2016-09-16 MED ORDER — ONDANSETRON HCL 4 MG PO TABS: 4.0000 mg | ORAL_TABLET | Freq: Three times a day (TID) | ORAL | 0 refills | Status: DC | PRN

## 2016-09-16 NOTE — Discharge Summary (Signed)
Physician Discharge Summary  Kristie [name] [id number] DOB: 1978/03/26  PCP: Jeanmarie Hubert, MD  Admit date: 09/13/2016 Discharge date: 09/16/2016  Recommendations for Outpatient Follow-up:  1. Dr. Jeanmarie Hubert, PCP in 1 week with repeat labs (CBC & BMP). Please follow final surveillance blood cultures sent on 09/13/16. 2. Dr. Alona Bene, Urologist in 1 week.  Home Health: None Equipment/Devices: None    Discharge Condition: Improved and stable  CODE STATUS: Full  Diet recommendation: Heart healthy diet.  Discharge Diagnoses:  Active Problems:   Essential hypertension   Hyperlipidemia   Abnormality of gait   Bacteremia   Bacteremia due to Escherichia coli   UTI (urinary tract infection)   Thrombocytopenia (HCC)   Cognitive impairment   Brief/Interim Summary: 83 y.o.femalewith medical history of hypertension, hyperlipidemia, fibromyalgia, cognitive impairment presented with 2 day history of generalized weakness. The patient states that this began when she woke up on the morning of 09/12/2016. She was so weak she had difficulty getting out of bed. She visited the emergency department on the morning of 09/12/2016. Urinalysis showed TNTC WBC, and blood cultures were obtained at that time. The patient was afebrile and hemodynamically stable. She was discharged home from the emergency department in stable condition with a prescription for ciprofloxacin. The patient has not filled the prescription. Blood cultures obtained on 09/12/2016 became positive on the morning of 09/13/2016 growing presumptive Escherichia coli. As a result, the patient was called to come back to the hospital for further evaluation and treatment. She continued to feel generalized weakness. In fact, the patient had a mechanical fall on the evening of 09/12/2016, but did not have syncope or headache.. At baseline, the patient uses a walker to ambulate. The patient has had subjective fevers and chills, but denies chest pain,  shortness of breath, sore throat, coughing, hemoptysis, nausea, vomiting, diarrhea, abdominal pain, dysuria, hematuria.  In the emergency department, the patient had a temperature of 99.70F. She was mildly tachycardic in the low 100s. She was hemodynamically stable saturating well on room air. BMP and hepatic enzymes were unremarkable. WBC was 11.1 with platelets 146,000. Lactic acid was 1.22. Chest x-ray on 09/12/2016 was negative for acute findings.  Assessment & Plan:   ESBL Escherichia coli bacteremia and UTI -Urine culture and blood cultures from 2/18 confirm multidrug resistant ecoli (sensitive to imipenem, nitrofurantoin, zosyn, bactrim) - BCID, blood culture 1 of 2 and urine culture from 2/18 shows MDR Escherichia coli. Second of 2 blood cultures 2/18 and blood cultures 2 from 2/19: Negative to date. - Initially placed on IV ceftriaxone and based upon blood cultures was transitioned to IV meropenem on 2/20. - As per discussion with patient and spouse, she has history of recurrent urinary tract infection, follows with Dr. Evans/urology in [place] and was given a prescription in December 2017 for Bactrim DS 1 tablet daily to be taken indefinitely. However patient did not tolerate this from nausea and vomiting. - Discussed in detail with infectious disease M.D. on call on 2/21 who recommended ideally to switch to oral Bactrim and complete total 10 days treatment and may use antiemetics if has nausea or vomiting. However if she is unable to tolerate oral Bactrim and then will need PICC line and IV ertapenem to complete total 10 days treatment. - Transitioned to oral Bactrim 2/21 PM.  - Patient has tolerated oral Bactrim and has received 3 doses thus far without nausea, vomiting or abdominal pain. Her husband has the bottle of Bactrim with 28 tablets in  it. I have advised him that she should take 1 tablet 2 times daily for the next 8 days and then reduce to 1 tablet daily until outpatient  follow-up with urology. He verbalized understanding.  Essential Hypertension -Continue amlodipine, losartan and metoprolol tartrate - Reasonable inpatient control.  Thrombocytopenia -Likely secondary to infectious process -B12 478 - Resolved.  Generalized weakness -Secondary to infectious process -TSH normal at 1.897 -PT has evaluated and have no recommendations at discharge.  Hyperlipidemia -Continue statin  Fibromyalgia -The patient takes ibuprofen 400 mg daily for the past 2-3 years-- she has been advised to discuss possible alternatives with her primary care provider. Ibuprofen not listed on her home medications. -Continue acetaminophen, Voltaren gel and gabapentin  Cognitive impairment -Continue home medications. - stable    Consultants:    none   Procedures:    none   Discharge Instructions  Discharge Instructions    Call MD for:  extreme fatigue    Complete by:  As directed    Call MD for:  persistant dizziness or light-headedness    Complete by:  As directed    Call MD for:  persistant nausea and vomiting    Complete by:  As directed    Call MD for:  severe uncontrolled pain    Complete by:  As directed    Call MD for:  temperature >100.4    Complete by:  As directed    Diet - low sodium heart healthy    Complete by:  As directed    Increase activity slowly    Complete by:  As directed        Medication List    STOP taking these medications   ciprofloxacin 500 MG tablet Commonly known as:  CIPRO   metoprolol succinate 25 MG 24 hr tablet Commonly known as:  TOPROL-XL     TAKE these medications   acetaminophen 500 MG tablet Commonly known as:  TYLENOL Take 500 mg by mouth. Take 2 tablets at night   acidophilus Caps capsule Take 1 capsule by mouth daily.   amLODipine 10 MG tablet Commonly known as:  NORVASC Take 1 tablet (10 mg total) by mouth daily.   aspirin 81 MG chewable tablet Chew 81 mg by mouth daily.    cholecalciferol 1000 units tablet Commonly known as:  VITAMIN D Take 1,000 Units by mouth daily. Take 2,000 units daily   diclofenac sodium 1 % Gel Commonly known as:  VOLTAREN Apply 2 g topically 2 (two) times daily.   donepezil 10 MG tablet Commonly known as:  ARICEPT Take 1 tablet (10 mg total) by mouth 2 (two) times daily.   fluticasone 50 MCG/ACT nasal spray Commonly known as:  FLONASE Place 2 sprays into both nostrils daily. 2 sprays in each nostril once daily   gabapentin 800 MG tablet Commonly known as:  NEURONTIN Take 1 tablet (800 mg total) by mouth 3 (three) times daily.   losartan 50 MG tablet Commonly known as:  COZAAR Take 1 tablet (50 mg total) by mouth daily. What changed:  Another medication with the same name was removed. Continue taking this medication, and follow the directions you see here.   Magnesium 250 MG Tabs Take 250 mg by mouth daily. Take one tablet daily   metoprolol tartrate 25 MG tablet Commonly known as:  LOPRESSOR Take 25 mg by mouth 2 (two) times daily.   mirabegron ER 25 MG Tb24 tablet Commonly known as:  MYRBETRIQ Take 1 tablet (25 mg total)  by mouth daily.   multivitamin with minerals Tabs tablet Take 1 tablet by mouth daily.   ondansetron 4 MG tablet Commonly known as:  ZOFRAN Take 1 tablet (4 mg total) by mouth every 8 (eight) hours as needed for nausea or vomiting. Take as needed 30 minutes before Bactrim dose.   sertraline 50 MG tablet Commonly known as:  ZOLOFT Take 1 tablet (50 mg total) by mouth daily.   simvastatin 10 MG tablet Commonly known as:  ZOCOR Take 1 tablet (10 mg total) by mouth at bedtime.   solifenacin 10 MG tablet Commonly known as:  VESICARE Take 1 tablet (10 mg total) by mouth daily.   sulfamethoxazole-trimethoprim 800-160 MG tablet Commonly known as:  BACTRIM DS,SEPTRA DS Take 1 tablet by mouth 2 (two) times daily. Take 1 tablet 2 times daily for 8 days then change to 1 tablet once daily until  follow-up with your Urologist. You have this medication with you.   zolpidem 10 MG tablet Commonly known as:  AMBIEN TAKE 1 TABLET AT BEDTIME AS NEEDED.      Follow-up Information    Jeanmarie Hubert, MD. Schedule an appointment as soon as possible for a visit in 1 week(s).   Specialty:  Internal Medicine Why:  To be seen with repeat labs (CBC & BMP). Contact information: [place] [address] [id number]        Domingo Pulse, MD. Schedule an appointment as soon as possible for a visit in 1 week(s).   Specialty:  Urology Contact information: [place] [address] [phone]          Allergies  Allergen Reactions  . Bactrim [Sulfamethoxazole-Trimethoprim] Nausea And Vomiting  . Restoril [Temazepam]     Nightmares     Procedures/Studies: Dg Chest 2 View  Result Date: 09/12/2016 CLINICAL DATA:  Weakness for 2 days. Ex-smoker with history of hypertension. EXAM: CHEST  2 VIEW COMPARISON:  Portable chest 05/05/2010. FINDINGS: The heart size and mediastinal contours are stable. There is mild aortic tortuosity. The lungs are clear. There is no pleural effusion or pneumothorax. No acute osseous findings are evident. Telemetry leads overlie the chest. IMPRESSION: Stable chest.  No active cardiopulmonary process. Electronically Signed   By: Richardean Sale M.D.   On: 09/12/2016 10:17      Subjective: Denies complaints. Eager to go home. As per spouse at bedside, no active issues. Tolerated Bactrim without nausea or vomiting or any other complaints. As per RN, no acute issues reported.  Discharge Exam:  Vitals:   09/15/16 2001 09/16/16 0448 09/16/16 0502 09/16/16 1100  BP: 140/77 129/72 (!) 115/59 132/73  Pulse: 84 85 87 85  Resp: 18 20 18    Temp: 98.7 F (37.1 C) 97.9 F (36.6 C) 98.1 F (36.7 C)   TempSrc: Oral Oral Oral   SpO2: 94% 93% 97%   Weight:      Height:        General exam: Pleasant elderly female sitting up  comfortably in chair this morning. Does not look septic or toxic. Spouse at bedside.  Respiratory system: Clear to auscultation. Respiratory effort normal. Cardiovascular system: S1 & S2 heard, RRR. No JVD, murmurs or pedal edema. Gastrointestinal system: Abdomen is nondistended, soft and nontender. No organomegaly or masses felt. Normal bowel sounds heard. Central nervous system: Alert and oriented. No focal neurological deficits. Extremities: Symmetric 5 x 5 power. Skin: No rashes, lesions Psychiatry: Judgement and insight appear slightly impaired. Mood & affect  appropriate.     The results of significant diagnostics from this hospitalization (including imaging, microbiology, ancillary and laboratory) are listed below for reference.     Microbiology: Recent Results (from the past 240 hour(s))  Blood Culture (routine x 2)     Status: Abnormal   Collection Time: 09/12/16  9:44 AM  Result Value Ref Range Status   Specimen Description BLOOD LEFT ARM  Final   Special Requests BOTTLES DRAWN AEROBIC AND ANAEROBIC 5 CC EACH  Final   Culture  Setup Time   Final    GRAM NEGATIVE RODS IN BOTH AEROBIC AND ANAEROBIC BOTTLES Organism ID to follow CRITICAL RESULT CALLED TO, READ BACK BY AND VERIFIED WITH: S MAYNARD,RN @0618  09/13/16 MKELLY,MLT    Culture (A)  Final    ESCHERICHIA COLI Confirmed Extended Spectrum Beta-Lactamase Producer (ESBL) Performed at [facility], [place] [address], [place], [place] [address]    Report Status 09/15/2016 FINAL  Final   Organism ID, Bacteria ESCHERICHIA COLI  Final      Susceptibility   Escherichia coli - MIC*    AMPICILLIN >=32 RESISTANT Resistant     CEFAZOLIN >=64 RESISTANT Resistant     CEFEPIME >=64 RESISTANT Resistant     CEFTAZIDIME RESISTANT Resistant     CEFTRIAXONE >=64 RESISTANT Resistant     CIPROFLOXACIN >=4 RESISTANT Resistant     GENTAMICIN >=16 RESISTANT Resistant     IMIPENEM <=0.25 SENSITIVE Sensitive     TRIMETH/SULFA <=20  SENSITIVE Sensitive     AMPICILLIN/SULBACTAM >=32 RESISTANT Resistant     PIP/TAZO 8 SENSITIVE Sensitive     Extended ESBL POSITIVE Resistant     * ESCHERICHIA COLI  Blood Culture ID Panel (Reflexed)     Status: Abnormal   Collection Time: 09/12/16  9:44 AM  Result Value Ref Range Status   Enterococcus species NOT DETECTED NOT DETECTED Final   Listeria monocytogenes NOT DETECTED NOT DETECTED Final   Staphylococcus species NOT DETECTED NOT DETECTED Final   Staphylococcus aureus NOT DETECTED NOT DETECTED Final   Streptococcus species NOT DETECTED NOT DETECTED Final   Streptococcus agalactiae NOT DETECTED NOT DETECTED Final   Streptococcus pneumoniae NOT DETECTED NOT DETECTED Final   Streptococcus pyogenes NOT DETECTED NOT DETECTED Final   Acinetobacter baumannii NOT DETECTED NOT DETECTED Final   Enterobacteriaceae species DETECTED (A) NOT DETECTED Final    Comment: Enterobacteriaceae represent a large family of gram-negative bacteria, not a single organism. CRITICAL RESULT CALLED TO, READ BACK BY AND VERIFIED WITH: S MAYNARD,RN @0618  09/13/16 MKELLY,MLT    Enterobacter cloacae complex NOT DETECTED NOT DETECTED Final   Escherichia coli DETECTED (A) NOT DETECTED Final    Comment: CRITICAL RESULT CALLED TO, READ BACK BY AND VERIFIED WITH: S MAYNARD,RN @0618  09/13/16 MKELLY,MLT    Klebsiella oxytoca NOT DETECTED NOT DETECTED Final   Klebsiella pneumoniae NOT DETECTED NOT DETECTED Final   Proteus species NOT DETECTED NOT DETECTED Final   Serratia marcescens NOT DETECTED NOT DETECTED Final   Carbapenem resistance NOT DETECTED NOT DETECTED Final   Haemophilus influenzae NOT DETECTED NOT DETECTED Final   Neisseria meningitidis NOT DETECTED NOT DETECTED Final   Pseudomonas aeruginosa NOT DETECTED NOT DETECTED Final   Candida albicans NOT DETECTED NOT DETECTED Final   Candida glabrata NOT DETECTED NOT DETECTED Final   Candida krusei NOT DETECTED NOT DETECTED Final   Candida parapsilosis NOT  DETECTED NOT DETECTED Final   Candida tropicalis NOT DETECTED NOT DETECTED Final    Comment: Performed at [facility]  [facility], [place] [address], [place], [place] [address]  Urine culture     Status: Abnormal   Collection Time: 09/12/16 11:27 AM  Result Value Ref Range Status   Specimen Description URINE, CATHETERIZED  Final   Special Requests NONE  Final   Culture (A)  Final    >=100,000 COLONIES/mL ESCHERICHIA COLI Confirmed Extended Spectrum Beta-Lactamase Producer (ESBL) Performed at [facility], [place] [address], [place], [place] [address]    Report Status 09/14/2016 FINAL  Final   Organism ID, Bacteria ESCHERICHIA COLI (A)  Final      Susceptibility   Escherichia coli - MIC*    AMPICILLIN >=32 RESISTANT Resistant     CEFAZOLIN >=64 RESISTANT Resistant     CEFTRIAXONE >=64 RESISTANT Resistant     CIPROFLOXACIN >=4 RESISTANT Resistant     GENTAMICIN >=16 RESISTANT Resistant     IMIPENEM <=0.25 SENSITIVE Sensitive     NITROFURANTOIN <=16 SENSITIVE Sensitive     TRIMETH/SULFA <=20 SENSITIVE Sensitive     AMPICILLIN/SULBACTAM >=32 RESISTANT Resistant     PIP/TAZO 8 SENSITIVE Sensitive     Extended ESBL POSITIVE Resistant     * >=100,000 COLONIES/mL ESCHERICHIA COLI  Blood Culture (routine x 2)     Status: None (Preliminary result)   Collection Time: 09/12/16 11:46 AM  Result Value Ref Range Status   Specimen Description BLOOD LEFT ARM  Final   Special Requests BOTTLES DRAWN AEROBIC AND ANAEROBIC  UNKNOWN  Final   Culture   Final    NO GROWTH 3 DAYS Performed at [facility], [address], [place], [place] [address]    Report Status PENDING  Incomplete  Blood culture (routine x 2)     Status: None (Preliminary result)   Collection Time: 09/13/16  9:40 AM  Result Value Ref Range Status   Specimen Description BLOOD LEFT ARM  Final   Special Requests BOTTLES DRAWN AEROBIC AND ANAEROBIC 5ML  Final   Culture   Final    NO GROWTH 2 DAYS Performed at [facility], [place] [address], [place], [place] [address]    Report Status PENDING  Incomplete  Blood culture (routine x 2)     Status: None (Preliminary result)   Collection Time: 09/13/16  9:40 AM  Result Value Ref Range Status   Specimen Description BLOOD LEFT ANTECUBITAL  Final   Special Requests BOTTLES DRAWN AEROBIC AND ANAEROBIC 5ML  Final   Culture   Final    NO GROWTH 2 DAYS Performed at [facility], [place] [address] [place],  [address]    Report Status PENDING  Incomplete  MRSA PCR Screening     Status: None   Collection Time: 09/13/16  6:50 PM  Result Value Ref Range Status   MRSA by PCR NEGATIVE NEGATIVE Final    Comment:        The GeneXpert MRSA Assay (FDA approved for NASAL specimens only), is one component of a comprehensive MRSA colonization surveillance program. It is not intended to diagnose MRSA infection nor to guide or monitor treatment for MRSA infections.      Labs:  Basic Metabolic Panel:  Recent Labs Lab 09/12/16 0915 09/13/16 0940 09/14/16 0513  NA 139 137 139  K 3.6 3.5 3.6  CL 104 103 107  CO2 28 27 26   GLUCOSE 125* 129* 116*  BUN 11 11 9   CREATININE 0.63 0.56 0.58  CALCIUM 9.1 9.1 8.4*  MG  --   --  2.1   Liver Function Tests:  Recent Labs Lab 09/12/16 0915 09/13/16 0940 09/14/16 0513  AST 21 23 23   ALT 15 21 22   ALKPHOS 57 62 53  BILITOT 0.9 0.8 0.4  PROT 6.5 6.8 6.2*  ALBUMIN 3.6 3.7 3.1*    CBC:  Recent Labs Lab 09/12/16 0915 09/13/16 0940 09/15/16 0647  WBC 10.0 11.1* 6.8  NEUTROABS 7.9* 9.1*  --   HGB 14.5 13.9 13.6  HCT 43.4 42.1 41.2  MCV 91.9 93.3 92.0  PLT 160 146* 196   Thyroid function studies  Recent Labs  09/13/16 1750  TSH 1.897   Anemia work up  Recent Labs  09/13/16 1750  VITAMINB12 478   Urinalysis    Component Value Date/Time   COLORURINE YELLOW 09/12/2016 1127   APPEARANCEUR HAZY (A) 09/12/2016 1127   LABSPEC 1.006 09/12/2016 1127   PHURINE 6.0 09/12/2016 1127    GLUCOSEU NEGATIVE 09/12/2016 1127   HGBUR SMALL (A) 09/12/2016 1127   BILIRUBINUR NEGATIVE 09/12/2016 1127   KETONESUR NEGATIVE 09/12/2016 1127   PROTEINUR NEGATIVE 09/12/2016 1127   UROBILINOGEN 0.2 04/15/2012 0720   NITRITE POSITIVE (A) 09/12/2016 1127   LEUKOCYTESUR LARGE (A) 09/12/2016 1127   Discussed in detail with patient's spouse at bedside. Updated care and answered questions.   Time coordinating discharge: Over 30 minutes  SIGNED:  Vernell Leep, MD, FACP, [place]. [facility] Pager [phone]  If 7PM-7AM, please contact night-coverage [email or web address] Password [facility] 09/16/2016, 2:58 PM

## 2016-09-16 NOTE — Progress Notes (Signed)
Patient is alert and oriented x4, ambulatory with walker. Discharge instructions reviewed. Questions, concerns denied. Pt request to schedule post dc f/u visit. Belongings in hand. No change to am assessment.

## 2016-09-16 NOTE — Discharge Instructions (Signed)
Bacteremia Introduction Bacteremia is the presence of bacteria in the blood. A small amount of bacteria may not cause any symptoms. Sometimes, the bacteria spread and cause infection in other parts of the body, such as the heart, joints, bones, or brain. Having a great amount of bacteria can cause a serious, sometimes life-threatening infection called sepsis. What are the causes? This condition is caused by bacteria that get into the blood. Bacteria can enter the blood:  During a dental or medical procedure.  After you brush your teeth so hard that the gums bleed.  Through a scrape or cut on your skin. More severe types of bacteremia can be caused by:  A bacterial infection, such as pneumonia, that spreads to the blood.  Using a dirty needle. What increases the risk? This condition is more likely to develop in:  Children and elderly adults.  People who have a long-lasting (chronic) disease or medical condition.  People who have an artificial joint or heart valve.  People who have heart valve disease.  People who have a tube, such as a catheter or IV tube, that has been inserted for a medical treatment.  People who have a weak body defense system (immune system).  People who use IV drugs. What are the signs or symptoms? Usually, this condition does not cause symptoms when it is mild. When it is more serious, it may cause:  Fever.  Chills.  Racing heart.  Shortness of breath.  Dizziness.  Weakness.  Confusion.  Nausea or vomiting.  Diarrhea. Bacteremia that has spread to other parts of the body may cause symptoms in those areas. How is this diagnosed? This condition may be diagnosed with a physical exam and tests, such as:  A complete blood count (CBC). This test looks for signs of infection.  Blood cultures. These look for bacteria in your blood.  Tests of any IV tubes. These look for a source of infection.  Urine tests.  Imaging tests, such as an  X-ray, CT scan, MRI, or heart ultrasound. How is this treated? If the condition is mild, treatment is usually not needed. Usually, the bodys immune system will remove the bacteria. If the condition is more serious, it may be treated with:  Antibiotic medicines through an IV tube. These may be given for about 2 weeks. At first, the antibiotic that is given may kill most types of blood bacteria. If your test results show that a certain kind of bacteria is causing problems, the antibiotic may be changed to kill only the bacteria that are causing problems.  Antibiotics taken by mouth.  Removing any catheter or IV tube that is a source of infection.  Blood pressure and breathing support, if needed.  Surgery to control the source or spread of infection, if needed. Follow these instructions at home:  Take over-the-counter and prescription medicines only as told by your health care provider.  If you were prescribed an antibiotic, take it as told by your health care provider. Do not stop taking the antibiotic even if you start to feel better.  Rest at home until your condition is under control.  Drink enough fluid to keep your urine clear or pale yellow.  Keep all follow-up visits as told by your health care provider. This is important. How is this prevented? Take these actions to help prevent future episodes of bacteremia:  Get all vaccinations as recommended by your health care provider.  Clean and cover scrapes or cuts.  Bathe regularly.  Wash your  hands often.  Before any dental or surgical procedure, ask your health care provider if you should take an antibiotic. Contact a health care provider if:  Your symptoms get worse.  You continue to have symptoms after treatment.  You develop new symptoms after treatment. Get help right away if:  You have chest pain or trouble breathing.  You develop confusion, dizziness, or weakness.  You develop pale skin. This information is  not intended to replace advice given to you by your health care provider. Make sure you discuss any questions you have with your health care provider. Document Released: 04/25/2006 Document Revised: 01/30/2016 Document Reviewed: 09/14/2014  2017 Elsevier   Urinary Tract Infection, Adult A urinary tract infection (UTI) is an infection of any part of the urinary tract, which includes the kidneys, ureters, bladder, and urethra. These organs make, store, and get rid of urine in the body. UTI can be a bladder infection (cystitis) or kidney infection (pyelonephritis). What are the causes? This infection may be caused by fungi, viruses, or bacteria. Bacteria are the most common cause of UTIs. This condition can also be caused by repeated incomplete emptying of the bladder during urination. What increases the risk? This condition is more likely to develop if:  You ignore your need to urinate or hold urine for long periods of time.  You do not empty your bladder completely during urination.  You wipe back to front after urinating or having a bowel movement, if you are female.  You are uncircumcised, if you are female.  You are constipated.  You have a urinary catheter that stays in place (indwelling).  You have a weak defense (immune) system.  You have a medical condition that affects your bowels, kidneys, or bladder.  You have diabetes.  You take antibiotic medicines frequently or for long periods of time, and the antibiotics no longer work well against certain types of infections (antibiotic resistance).  You take medicines that irritate your urinary tract.  You are exposed to chemicals that irritate your urinary tract.  You are female. What are the signs or symptoms? Symptoms of this condition include:  Fever.  Frequent urination or passing small amounts of urine frequently.  Needing to urinate urgently.  Pain or burning with urination.  Urine that smells bad or  unusual.  Cloudy urine.  Pain in the lower abdomen or back.  Trouble urinating.  Blood in the urine.  Vomiting or being less hungry than normal.  Diarrhea or abdominal pain.  Vaginal discharge, if you are female. How is this diagnosed? This condition is diagnosed with a medical history and physical exam. You will also need to provide a urine sample to test your urine. Other tests may be done, including:  Blood tests.  Sexually transmitted disease (STD) testing. If you have had more than one UTI, a cystoscopy or imaging studies may be done to determine the cause of the infections. How is this treated? Treatment for this condition often includes a combination of two or more of the following:  Antibiotic medicine.  Other medicines to treat less common causes of UTI.  Over-the-counter medicines to treat pain.  Drinking enough water to stay hydrated. Follow these instructions at home:  Take over-the-counter and prescription medicines only as told by your health care provider.  If you were prescribed an antibiotic, take it as told by your health care provider. Do not stop taking the antibiotic even if you start to feel better.  Avoid alcohol, caffeine, tea, and  carbonated beverages. They can irritate your bladder.  Drink enough fluid to keep your urine clear or pale yellow.  Keep all follow-up visits as told by your health care provider. This is important.  Make sure to:  Empty your bladder often and completely. Do not hold urine for long periods of time.  Empty your bladder before and after sex.  Wipe from front to back after a bowel movement if you are female. Use each tissue one time when you wipe. Contact a health care provider if:  You have back pain.  You have a fever.  You feel nauseous or vomit.  Your symptoms do not get better after 3 days.  Your symptoms go away and then return. Get help right away if:  You have severe back pain or lower abdominal  pain.  You are vomiting and cannot keep down any medicines or water. This information is not intended to replace advice given to you by your health care provider. Make sure you discuss any questions you have with your health care provider. Document Released: 04/21/2005 Document Revised: 12/24/2015 Document Reviewed: 06/02/2015 Elsevier Interactive Patient Education  2017 [employer].  Discharge instructions:  Please get your medications reviewed and adjusted by your Primary MD.  Please request your Primary MD to go over all Hospital Tests and Procedure/Radiological results at the follow up, please get all Hospital records sent to your Prim MD by signing hospital release before you go home.  If you had Pneumonia of Lung problems at the Hospital: Please get a 2 view Chest X ray done in 6-8 weeks after hospital discharge or sooner if instructed by your Primary MD.  If you have Congestive Heart Failure: Please call your Cardiologist or Primary MD anytime you have any of the following symptoms:  1) 3 pound weight gain in 24 hours or 5 pounds in 1 week  2) shortness of breath, with or without a dry hacking cough  3) swelling in the hands, feet or stomach  4) if you have to sleep on extra pillows at night in order to breathe  Follow cardiac low salt diet and 1.5 lit/day fluid restriction.  If you have diabetes Accuchecks 4 times/day, Once in AM empty stomach and then before each meal. Log in all results and show them to your primary doctor at your next visit. If any glucose reading is under 80 or above 300 call your primary MD immediately.  If you have Seizure/Convulsions/Epilepsy: Please do not drive, operate heavy machinery, participate in activities at heights or participate in high speed sports until you have seen by Primary MD or a Neurologist and advised to do so again.  If you had Gastrointestinal Bleeding: Please ask your Primary MD to check a complete blood count within one week  of discharge or at your next visit. Your endoscopic/colonoscopic biopsies that are pending at the time of discharge, will also need to followed by your Primary MD.  Get Medicines reviewed and adjusted. Please take all your medications with you for your next visit with your Primary MD  Please request your Primary MD to go over all hospital tests and procedure/radiological results at the follow up, please ask your Primary MD to get all Hospital records sent to his/her office.  If you experience worsening of your admission symptoms, develop shortness of breath, life threatening emergency, suicidal or homicidal thoughts you must seek medical attention immediately by calling 911 or calling your MD immediately  if symptoms less severe.  You must  read complete instructions/literature along with all the possible adverse reactions/side effects for all the Medicines you take and that have been prescribed to you. Take any new Medicines after you have completely understood and accpet all the possible adverse reactions/side effects.   Do not drive or operate heavy machinery when taking Pain medications.   Do not take more than prescribed Pain, Sleep and Anxiety Medications  Special Instructions: If you have smoked or chewed Tobacco  in the last 2 yrs please stop smoking, stop any regular Alcohol  and or any Recreational drug use.  Wear Seat belts while driving.  Please note You were cared for by a hospitalist during your hospital stay. If you have any questions about your discharge medications or the care you received while you were in the hospital after you are discharged, you can call the unit and asked to speak with the hospitalist on call if the hospitalist that took care of you is not available. Once you are discharged, your primary care physician will handle any further medical issues. Please note that NO REFILLS for any discharge medications will be authorized once you are discharged, as it is  imperative that you return to your primary care physician (or establish a relationship with a primary care physician if you do not have one) for your aftercare needs so that they can reassess your need for medications and monitor your lab values.  You can reach the hospitalist office at phone [phone] or fax [phone]   If you do not have a primary care physician, you can call [phone] for a physician referral.

## 2016-09-16 NOTE — Evaluation (Signed)
Physical Therapy Evaluation Patient Details Name: Kristie [name] MRN: [id number] DOB: September 23, 1928 Today's Date: 09/16/2016   History of Present Illness  83 y.o. female admitted with bacteremia, UTI. PMH of R foot drop 2* back surgery 15 years ago.   Clinical Impression  Pt is independent with mobility using a RW. She ambulated 220' with RW and R AFO without loss of balance. She reports she feels she's at baseline with mobility. No follow up indicated, PT signing off.     Follow Up Recommendations No PT follow up    Equipment Recommendations  None recommended by PT    Recommendations for Other Services       Precautions / Restrictions Precautions Precautions: Fall Precaution Comments: 1 fall just prior to admission, no other falls in past 1 year Restrictions Weight Bearing Restrictions: No      Mobility  Bed Mobility               General bed mobility comments: up in recliner  Transfers Overall transfer level: Modified independent Equipment used: Rolling walker (2 wheeled)                Ambulation/Gait Ambulation/Gait assistance: Modified independent (Device/Increase time) Ambulation Distance (Feet): 220 Feet Assistive device: Rolling walker (2 wheeled) Gait Pattern/deviations: Step-through pattern   Gait velocity interpretation: at or above normal speed for age/gender General Gait Details: R AFO, no LOB  Stairs            Wheelchair Mobility    Modified Rankin (Stroke Patients Only)       Balance Overall balance assessment: Modified Independent                                           Pertinent Vitals/Pain Pain Assessment: No/denies pain    Home Living Family/patient expects to be discharged to:: Private residence Living Arrangements: Spouse/significant other Available Help at Discharge: Family;Available 24 hours/day Type of Home: Independent living facility Home Access: Level entry     Home Layout: One  level Home Equipment: Walker - 2 wheels;Shower seat;Cane - single point;Other (comment);Walker - 4 wheels (R AFO)      Prior Function Level of Independence: Independent with assistive device(s)         Comments: walks with R AFO and RW vs SPC     Hand Dominance        Extremity/Trunk Assessment   Upper Extremity Assessment Upper Extremity Assessment: Overall WFL for tasks assessed    Lower Extremity Assessment Lower Extremity Assessment: Overall WFL for tasks assessed (h/o R foot drop, numbness R foot, wears AFO)    Cervical / Trunk Assessment Cervical / Trunk Assessment: Normal  Communication   Communication: No difficulties  Cognition Arousal/Alertness: Awake/alert Behavior During Therapy: WFL for tasks assessed/performed Overall Cognitive Status: Within Functional Limits for tasks assessed                      General Comments      Exercises     Assessment/Plan    PT Assessment Patent does not need any further PT services  PT Problem List         PT Treatment Interventions      PT Goals (Current goals can be found in the Care Plan section)  Acute Rehab PT Goals PT Goal Formulation: All assessment and education complete, DC therapy  Frequency     Barriers to discharge        Co-evaluation               End of Session Equipment Utilized During Treatment: Gait belt;Other (comment) (R AFO) Activity Tolerance: Patient tolerated treatment well Patient left: in chair;with call bell/phone within reach;with family/visitor present Nurse Communication: Mobility status           Time: [id number] PT Time Calculation (min) (ACUTE ONLY): 14 min   Charges:   PT Evaluation $PT Eval Low Complexity: 1 Procedure     PT G Codes:         Philomena Doheny 09/16/2016, 11:20 AM [phone]

## 2016-09-17 ENCOUNTER — Telehealth: Payer: Self-pay

## 2016-09-17 LAB — CULTURE, BLOOD (ROUTINE X 2): CULTURE: NO GROWTH

## 2016-09-17 NOTE — Telephone Encounter (Signed)
Possible re-admission to facility. This is a patient you were seeing at [facility]. [facility] F/U is needed if patient was re-admitted to facility upon discharge. Hospital discharge from [place] on 09/16/16.

## 2016-09-18 LAB — CULTURE, BLOOD (ROUTINE X 2)
CULTURE: NO GROWTH
CULTURE: NO GROWTH

## 2016-09-24 ENCOUNTER — Telehealth: Payer: Self-pay

## 2016-09-24 NOTE — Telephone Encounter (Signed)
Please add the CBC and BMP as requested

## 2016-09-24 NOTE — Addendum Note (Signed)
Addended by: Ripley Fraise on: 09/24/2016 01:34 PM   Modules accepted: Orders

## 2016-09-24 NOTE — Addendum Note (Signed)
Addended by: Ripley Fraise on: 09/24/2016 01:48 PM   Modules accepted: Orders

## 2016-09-24 NOTE — Telephone Encounter (Signed)
Added labs to order.

## 2016-09-24 NOTE — Telephone Encounter (Signed)
Patient called requesting orders for a CBC and BMP to be added to her lab orders for Thursday 09/30/16 at [facility].  Please advise

## 2016-09-30 ENCOUNTER — Non-Acute Institutional Stay: Payer: Medicare Other | Admitting: Nurse Practitioner

## 2016-09-30 ENCOUNTER — Other Ambulatory Visit: Payer: Medicare Other

## 2016-09-30 ENCOUNTER — Encounter: Payer: Self-pay | Admitting: Nurse Practitioner

## 2016-09-30 DIAGNOSIS — N3946 Mixed incontinence: Secondary | ICD-10-CM | POA: Diagnosis not present

## 2016-09-30 DIAGNOSIS — F329 Major depressive disorder, single episode, unspecified: Secondary | ICD-10-CM | POA: Diagnosis not present

## 2016-09-30 DIAGNOSIS — E876 Hypokalemia: Secondary | ICD-10-CM | POA: Diagnosis not present

## 2016-09-30 DIAGNOSIS — G4700 Insomnia, unspecified: Secondary | ICD-10-CM

## 2016-09-30 DIAGNOSIS — R413 Other amnesia: Secondary | ICD-10-CM

## 2016-09-30 DIAGNOSIS — M797 Fibromyalgia: Secondary | ICD-10-CM

## 2016-09-30 DIAGNOSIS — I10 Essential (primary) hypertension: Secondary | ICD-10-CM

## 2016-09-30 DIAGNOSIS — F32A Depression, unspecified: Secondary | ICD-10-CM

## 2016-09-30 DIAGNOSIS — M158 Other polyosteoarthritis: Secondary | ICD-10-CM

## 2016-09-30 NOTE — Assessment & Plan Note (Signed)
Controlled. Continue Amlodipine 10mg , Losartan 50mg  qd,  Metoprolol 25mg  bid

## 2016-09-30 NOTE — Progress Notes (Signed)
Provider:  Marlana Latus NP  Location:   [facility]   Place of Service:  Clinic ([phone])  PCP: Jeanmarie Hubert, MD Patient Care Team: Estill Dooms, MD as PCP - General (Internal Medicine) Bo Merino, MD as Consulting Physician (Rheumatology) Juanita Craver, MD as Consulting Physician (Gastroenterology) Vickey Huger, MD as Consulting Physician (Orthopedic Surgery) Calvert Cantor, MD as Consulting Physician (Ophthalmology) Jaidee Stipe Otho Darner, NP as Nurse Practitioner (Internal Medicine)  Extended Emergency Contact Information Primary Emergency Contact: Gruendler,Joseph Address: [address] [place] Phone: [phone] Mobile Phone: [phone] Relation: Spouse  Code Status: DNR Goals of Care: Advanced Directive information Advanced Directives 09/13/2016  Does Patient Have a Medical Advance Directive? No  Type of Advance Directive -  Does patient want to make changes to medical advance directive? -  Copy of Roland in Chart? -  Would patient like information on creating a medical advance directive? No - Patient declined  Pre-existing out of facility DNR order (yellow form or pink MOST form) -      Chief Complaint  Patient presents with  . Hospitalization Follow-up    HPI: Patient is a 83 y.o. female seen today for f/u hospital stay 09/13/16 to 09/16/16 for UTI, fully treated with Bactrim and f/u Urology. 09/23/16 wbc 6.9, Hgb 14.9, plt 346, Na 136, K 4.7, Bun 16, creat 0.81.   Hx of HTN, not controlled, taking Amlodipine 10mg , Losartan 50mg , Metoprolol 25mg  bid,  Insomnia Ambien 10mg , depression stable on Sertraline 50mg , incontinent 2-3 x/night, f/u Urology, taking Vesicare and Myrbetriq, Memory lapses, taking Aricept, neuropathy, her neurologists passed away and retired. Taking Gabapentin. Fibromyalgia taking Tylenol 2 tabs at 4pm, Ibuprofen bid.   Past Medical History:  Diagnosis Date  . Cataract   . Fibromyalgia  04/25/2014  . Hyperlipidemia   . Hypertension   . Lesion of sciatic nerve   . Low back pain   . Memory loss   . Spinal stenosis of lumbar region 04/25/2014  . Tremor   . Weakness    Past Surgical History:  Procedure Laterality Date  . ABDOMINAL HYSTERECTOMY  1963  . APPENDECTOMY    . CATARACT EXTRACTION W/ INTRAOCULAR LENS  IMPLANT, BILATERAL  2005 and 2007  . COLON SURGERY  1974   colon polyp  . COLON SURGERY  2002   colon polyps(2)  Dr Collene Mares  . COLONOSCOPY W/ POLYPECTOMY  1974and 2002  . EYE SURGERY  2005/2007   cataract renoval  Dr. Bing Plume  . KNEE SURGERY  2009   Dr. Ronnie Derby  . LUMBAR LAMINECTOMY  2012   L4-5 with spinous process fixation (06/22/11) Dr. Tommi Emery  . schwanoma  1987   residual right foot drop and right leg pain. Dr. Deirdre Pippins  . SMALL INTESTINE SURGERY  1990   bowel obstruction, Dr. Rebekah Chesterfield  . TONSILLECTOMY  1940    reports that she quit smoking about 42 years ago. She has never used smokeless tobacco. She reports that she drinks about 1.2 oz of alcohol per week . She reports that she does not use drugs. Social History   Social History  . Marital status: Married    Spouse name: N/A  . Number of children: 4  . Years of education: N/A   Occupational History  . retired Public relations account executive  Retired    retired   Social History Main Topics  . Smoking status: Former Smoker    Quit date:  04/25/1974  . Smokeless tobacco: Never Used     Comment: Quit thirty years ago.  . Alcohol use 1.2 oz/week    2 Glasses of wine per week     Comment: 2 glasses of wine daily  . Drug use: No  . Sexual activity: Not on file   Other Topics Concern  . Not on file   Social History Narrative   Patient is retired Public relations account executive and lives with her husband, (married since 1980) at [place] since 2013   Patient  is right handed. She drinks 1 cup of coffee per day.    She does not have any pets.   Former smoker for 20 years   Exercise none                           Functional Status Survey:    Family History  Problem Relation Age of Onset  . Breast cancer Mother   . Heart disease Mother   . Dementia Father   . Cancer Sister     Esophageal     Health Maintenance  Topic Date Due  . TETANUS/TDAP  08/20/1949  . DEXA SCAN  08/21/1995  . PNA vac Low Risk Adult (1 of 2 - PCV13) 08/21/1995  . INFLUENZA VACCINE  Completed    Allergies  Allergen Reactions  . Bactrim [Sulfamethoxazole-Trimethoprim] Nausea And Vomiting  . Restoril [Temazepam]     Nightmares    Allergies as of 09/30/2016      Reactions   Bactrim [sulfamethoxazole-trimethoprim] Nausea And Vomiting   Restoril [temazepam]    Nightmares      Medication List       Accurate as of 09/30/16  2:59 PM. Always use your most recent med list.          acetaminophen 500 MG tablet Commonly known as:  TYLENOL Take 500 mg by mouth. Take 2 tablets at night   acidophilus Caps capsule Take 1 capsule by mouth daily.   amLODipine 10 MG tablet Commonly known as:  NORVASC Take 1 tablet (10 mg total) by mouth daily.   ampicillin 500 MG capsule Commonly known as:  PRINCIPEN Take 500 mg by mouth 3 (three) times daily. For 7 days   aspirin 81 MG chewable tablet Chew 81 mg by mouth daily.   cholecalciferol 1000 units tablet Commonly known as:  VITAMIN D Take 1,000 Units by mouth daily. Take 2,000 units daily   diclofenac sodium 1 % Gel Commonly known as:  VOLTAREN Apply 2 g topically 2 (two) times daily.   donepezil 10 MG tablet Commonly known as:  ARICEPT Take 1 tablet (10 mg total) by mouth 2 (two) times daily.   fluticasone 50 MCG/ACT nasal spray Commonly known as:  FLONASE Place 2 sprays into both nostrils daily. 2 sprays in each nostril once daily   gabapentin 800 MG tablet Commonly known as:  NEURONTIN Take 1 tablet (800 mg total) by mouth 3 (three) times daily.   losartan 50 MG tablet Commonly known as:  COZAAR Take 1 tablet (50 mg total) by mouth daily.    Magnesium 250 MG Tabs Take 250 mg by mouth daily. Take one tablet daily   methenamine 1 g tablet Commonly known as:  HIPREX Take 1 g by mouth 2 (two) times daily with a meal.   metoprolol tartrate 25 MG tablet Commonly known as:  LOPRESSOR Take 25 mg by mouth 2 (two) times daily.   mirabegron  ER 25 MG Tb24 tablet Commonly known as:  MYRBETRIQ Take 1 tablet (25 mg total) by mouth daily.   multivitamin with minerals Tabs tablet Take 1 tablet by mouth daily.   ondansetron 4 MG tablet Commonly known as:  ZOFRAN Take 1 tablet (4 mg total) by mouth every 8 (eight) hours as needed for nausea or vomiting. Take as needed 30 minutes before Bactrim dose.   sertraline 50 MG tablet Commonly known as:  ZOLOFT Take 1 tablet (50 mg total) by mouth daily.   simvastatin 10 MG tablet Commonly known as:  ZOCOR Take 1 tablet (10 mg total) by mouth at bedtime.   solifenacin 10 MG tablet Commonly known as:  VESICARE Take 1 tablet (10 mg total) by mouth daily.   sulfamethoxazole-trimethoprim 800-160 MG tablet Commonly known as:  BACTRIM DS,SEPTRA DS Take 1 tablet by mouth 2 (two) times daily. Take 1 tablet 2 times daily for 8 days then change to 1 tablet once daily until follow-up with your Urologist. You have this medication with you.   vitamin C 500 MG tablet Commonly known as:  ASCORBIC ACID Take 500 mg by mouth daily.   zolpidem 10 MG tablet Commonly known as:  AMBIEN TAKE 1 TABLET AT BEDTIME AS NEEDED.       Review of Systems  Constitutional: Positive for fatigue. Negative for activity change, appetite change, chills, diaphoresis, fever and unexpected weight change.  HENT: Negative for congestion, ear discharge, ear pain, hearing loss, postnasal drip, rhinorrhea, sore throat, tinnitus, trouble swallowing and voice change.   Eyes: Negative for pain, redness, itching and visual disturbance.  Respiratory: Negative for cough, choking, shortness of breath and wheezing.    Cardiovascular: Negative for chest pain, palpitations and leg swelling.  Gastrointestinal: Negative for abdominal distention, abdominal pain, constipation, diarrhea and nausea.  Endocrine: Negative for cold intolerance, heat intolerance, polydipsia, polyphagia and polyuria.  Genitourinary: Positive for frequency. Negative for difficulty urinating, dysuria, flank pain, hematuria, pelvic pain, urgency and vaginal discharge.  Musculoskeletal: Positive for gait problem (cane). Negative for arthralgias, back pain, myalgias, neck pain and neck stiffness.       Right foot drop  Skin: Negative for color change, pallor and rash.  Allergic/Immunologic: Negative.   Neurological: Negative for dizziness, tremors, seizures, syncope, weakness, numbness and headaches.       Lower body weakness, ambulates with walker  Hematological: Negative for adenopathy. Does not bruise/bleed easily.  Psychiatric/Behavioral: Positive for sleep disturbance. Negative for agitation, behavioral problems, confusion, dysphoric mood, hallucinations and suicidal ideas. The patient is nervous/anxious. The patient is not hyperactive.     Vitals:   09/30/16 1426  BP: (!) 150/84  Pulse: 82  Resp: 20  Temp: 97.5 F (36.4 C)  SpO2: 93%  Weight: 186 lb 6.4 oz (84.6 kg)  Height: 5\' 8"  (1.727 m)   Body mass index is 28.34 kg/m. Physical Exam  Constitutional: She is oriented to person, place, and time. She appears well-developed and well-nourished. No distress.  HENT:  Right Ear: External ear normal.  Left Ear: External ear normal.  Nose: Nose normal.  Mouth/Throat: Oropharynx is clear and moist. No oropharyngeal exudate.  Eyes: Conjunctivae and EOM are normal. Pupils are equal, round, and reactive to light. No scleral icterus.  Neck: No JVD present. No tracheal deviation present. No thyromegaly present.  Cardiovascular: Normal rate, regular rhythm, normal heart sounds and intact distal pulses.  Exam reveals no gallop and no  friction rub.   No murmur heard. Pulmonary/Chest: Effort normal.  No respiratory distress. She has no wheezes. She has no rales. She exhibits no tenderness.  Abdominal: She exhibits no distension and no mass. There is no tenderness.  Musculoskeletal: Normal range of motion. She exhibits no edema or tenderness.  RIGHT afo FOR FOOT DROP  Lymphadenopathy:    She has no cervical adenopathy.  Neurological: She is alert and oriented to person, place, and time. No cranial nerve deficit. Coordination normal.  Right foot drop, brace  Skin: No rash noted. She is not diaphoretic. No erythema. No pallor.  Psychiatric: She has a normal mood and affect. Her behavior is normal. Judgment and thought content normal.    Labs reviewed: Basic Metabolic Panel:  Recent Labs  09/12/16 0915 09/13/16 0940 09/14/16 0513  NA 139 137 139  K 3.6 3.5 3.6  CL 104 103 107  CO2 28 27 26   GLUCOSE 125* 129* 116*  BUN 11 11 9   CREATININE 0.63 0.56 0.58  CALCIUM 9.1 9.1 8.4*  MG  --   --  2.1   Liver Function Tests:  Recent Labs  09/12/16 0915 09/13/16 0940 09/14/16 0513  AST 21 23 23   ALT 15 21 22   ALKPHOS 57 62 53  BILITOT 0.9 0.8 0.4  PROT 6.5 6.8 6.2*  ALBUMIN 3.6 3.7 3.1*   No results for input(s): LIPASE, AMYLASE in the last 8760 hours. No results for input(s): AMMONIA in the last 8760 hours. CBC:  Recent Labs  09/12/16 0915 09/13/16 0940 09/15/16 0647  WBC 10.0 11.1* 6.8  NEUTROABS 7.9* 9.1*  --   HGB 14.5 13.9 13.6  HCT 43.4 42.1 41.2  MCV 91.9 93.3 92.0  PLT 160 146* 196   Cardiac Enzymes: No results for input(s): CKTOTAL, CKMB, CKMBINDEX, TROPONINI in the last 8760 hours. BNP: Invalid input(s): POCBNP No results found for: HGBA1C Lab Results  Component Value Date   TSH 1.897 09/13/2016   Lab Results  Component Value Date   [id number] 545 09/13/2016   No results found for: FOLATE No results found for: IRON, TIBC, FERRITIN  Imaging and Procedures obtained prior to SNF  admission: Dg Hip Complete Right  Result Date: 04/13/2012 *RADIOLOGY REPORT* Clinical Data: Fall.  Right hip pain. RIGHT HIP - COMPLETE 2+ VIEW Comparison: With Findings: The patient has acute or right superior and inferior pubic rami fractures.  No other fracture is identified.  Left worse than right hip osteoarthritis is seen.  Postoperative change lower lumbar spine and right pelvis noted. IMPRESSION: 1.  Acute right superior and inferior pubic rami fractures. 2.  Left worse than right hip osteoarthritis. Original Report Authenticated By: Arvid Right. Luther Parody, M.D.    Assessment/Plan  Essential hypertension Controlled. Continue Amlodipine 10mg , Losartan 50mg  qd,  Metoprolol 25mg  bid  Osteoarthritis Pain is managed with Neurontin and Tylenol.  Hypokalemia K 4.7 09/23/16  Fibromyalgia syndrome Tylenol 2 tabs at 4pm, Ibuprofen bid.    Urine incontinence Continue taking Vesicare and Myrbetriq, f/u Urology  Insomnia Not sleeping well. Continue Ambien  Depression Stable. Continue Sertraline 50mg   Memory deficit Memory lapses, continue Aricept  Family/ staff Communication: [place]  Labs/tests ordered: CBC CMP done 09/23/16

## 2016-09-30 NOTE — Assessment & Plan Note (Signed)
K 4.7 09/23/16

## 2016-09-30 NOTE — Assessment & Plan Note (Signed)
Memory lapses, continue Aricept

## 2016-09-30 NOTE — Assessment & Plan Note (Signed)
Tylenol 2 tabs at 4pm, Ibuprofen bid.

## 2016-09-30 NOTE — Assessment & Plan Note (Signed)
Pain is managed with Neurontin and Tylenol.

## 2016-09-30 NOTE — Assessment & Plan Note (Signed)
Stable. Continue Sertraline 50mg 

## 2016-09-30 NOTE — Assessment & Plan Note (Signed)
Not sleeping well. Continue Ambien

## 2016-09-30 NOTE — Assessment & Plan Note (Signed)
Continue taking Vesicare and Myrbetriq, f/u Urology

## 2016-10-04 ENCOUNTER — Other Ambulatory Visit: Payer: Self-pay | Admitting: Nurse Practitioner

## 2016-10-04 ENCOUNTER — Other Ambulatory Visit: Payer: Self-pay | Admitting: *Deleted

## 2016-10-04 MED ORDER — METOPROLOL TARTRATE 25 MG PO TABS: 25.0000 mg | ORAL_TABLET | Freq: Two times a day (BID) | ORAL | 0 refills | Status: DC

## 2016-10-04 NOTE — Telephone Encounter (Signed)
[facility]  

## 2016-10-05 ENCOUNTER — Telehealth: Payer: Self-pay | Admitting: *Deleted

## 2016-10-05 NOTE — Telephone Encounter (Signed)
Received a fax from [facility] [phone] stating Rx on 08/19/2016 was for Metoprolol Succinate (Toprol XL 25mg ) and then received a Rx for Tartrate. Is this suppose to be succinate or Tartrate. Fax given to Dr. Nyoka Cowden to clarify and he stated Metoprolol is suppose to be Tartarate.  Medication list up todate and called and spoke with [facility] at [facility].

## 2016-10-19 ENCOUNTER — Encounter: Payer: Self-pay | Admitting: Rheumatology

## 2016-10-19 ENCOUNTER — Ambulatory Visit (INDEPENDENT_AMBULATORY_CARE_PROVIDER_SITE_OTHER): Payer: Medicare Other | Admitting: Rheumatology

## 2016-10-19 VITALS — BP 120/86 | HR 88 | Resp 14 | Ht 68.0 in | Wt 183.0 lb

## 2016-10-19 DIAGNOSIS — Z8679 Personal history of other diseases of the circulatory system: Secondary | ICD-10-CM | POA: Diagnosis not present

## 2016-10-19 DIAGNOSIS — M19042 Primary osteoarthritis, left hand: Secondary | ICD-10-CM | POA: Diagnosis not present

## 2016-10-19 DIAGNOSIS — E559 Vitamin D deficiency, unspecified: Secondary | ICD-10-CM

## 2016-10-19 DIAGNOSIS — M170 Bilateral primary osteoarthritis of knee: Secondary | ICD-10-CM | POA: Insufficient documentation

## 2016-10-19 DIAGNOSIS — G8929 Other chronic pain: Secondary | ICD-10-CM

## 2016-10-19 DIAGNOSIS — M5136 Other intervertebral disc degeneration, lumbar region: Secondary | ICD-10-CM

## 2016-10-19 DIAGNOSIS — R29898 Other symptoms and signs involving the musculoskeletal system: Secondary | ICD-10-CM | POA: Diagnosis not present

## 2016-10-19 DIAGNOSIS — M8589 Other specified disorders of bone density and structure, multiple sites: Secondary | ICD-10-CM | POA: Insufficient documentation

## 2016-10-19 DIAGNOSIS — M797 Fibromyalgia: Secondary | ICD-10-CM | POA: Diagnosis not present

## 2016-10-19 DIAGNOSIS — M19041 Primary osteoarthritis, right hand: Secondary | ICD-10-CM | POA: Diagnosis not present

## 2016-10-19 DIAGNOSIS — M25561 Pain in right knee: Secondary | ICD-10-CM

## 2016-10-19 MED ORDER — TRIAMCINOLONE ACETONIDE 40 MG/ML IJ SUSP
40.0000 mg | INTRAMUSCULAR | Status: AC | PRN
  Administered 2016-10-19: 40 mg via INTRA_ARTICULAR

## 2016-10-19 MED ORDER — LIDOCAINE HCL 1 % IJ SOLN
1.5000 mL | INTRAMUSCULAR | Status: AC | PRN
  Administered 2016-10-19: 1.5 mL

## 2016-10-19 NOTE — Progress Notes (Signed)
Office Visit Note  Patient: Kristie [name]             Date of Birth: 1929-05-28           MRN: [id number]             PCP: Jeanmarie Hubert, MD Referring: Estill Dooms, MD Visit Date: 10/19/2016 Occupation: @GUAROCC @    Subjective:  Follow-up (has been in hospital for UTI )   History of Present Illness: Kristie [name] is a 83 y.o. female  Last seen 04/20/2016  Patient was recently in the emergency room in February 2018 for generalized weakness. A urinalysis showed too numerous to count white blood cell and was treated with ciprofloxacin in the hospital.  Blood cultures were positive for Escherichia coli and was readmitted to the hospital for evaluation and treatment. In addition, patient had a mechanical fall on the evening of 09/12/2016(and did not have syncope or headaches at the time of the fall). She did have fever and chills but no chest pain shortness of breath sore throat coughing nausea vomiting diarrhea abdominal pain dysuria or hematuria her history of present illness taken by Dr. Nyoka Cowden (patient's PCP). See epic for full details  Today, she follows up for fibromyalgia. Her fibromyalgia is doing fairly well. She rates her discomfort as a 4 on a scale of 0-10.  She is recovered well from her hospitalization for septicemia. She is also followed up with her PCP and had repeat labs done 09/23/2016. Patient CMP is within normal limits with sodium at 136 potassium at 4.7 chloride at 103 glucose at 98 creatinine at 0.81 GFR greater than 60 CBC with differential showed white count of 6.9 hemoglobin of 14.9 hematocrit of 45.2 MCV of 93.3 platelets of 346,000. These labs were ordered by Dr. Alona Bene.  Currently she is up-to-date on her medications and does not need any refills.  She lives at friend's home and loves it there. She has multiple activities that she enjoys and is getting very good care. She is there with her husband and a very nice apartment that is appropriate for her  needs. Activities of Daily Living:  Patient reports morning stiffness for 30 minutes.   Patient Reports nocturnal pain.  Difficulty dressing/grooming: Reports Difficulty climbing stairs: Reports Difficulty getting out of chair: Reports Difficulty using hands for taps, buttons, cutlery, and/or writing: Reports   Review of Systems  Constitutional: Positive for fatigue.  HENT: Negative for mouth sores and mouth dryness.   Eyes: Negative for dryness.  Respiratory: Negative for shortness of breath.   Gastrointestinal: Negative for constipation and diarrhea.  Musculoskeletal: Positive for myalgias and myalgias.  Skin: Negative for sensitivity to sunlight.  Psychiatric/Behavioral: Positive for sleep disturbance. Negative for decreased concentration.    PMFS History:  Patient Active Problem List   Diagnosis Date Noted  . Primary osteoarthritis of both knees 10/19/2016  . DDD (degenerative disc disease), lumbar 10/19/2016  . History of hypertension 10/19/2016  . Osteopenia of multiple sites 10/19/2016  . Bilateral leg weakness 09/15/2016  . Vitamin D deficiency 09/15/2016  . Bacteremia 09/13/2016  . Bacteremia due to Escherichia coli 09/13/2016  . UTI (urinary tract infection) 09/13/2016  . Thrombocytopenia ([place]) 09/13/2016  . Cognitive impairment 09/13/2016  . Depression 08/05/2016  . Memory deficit 08/05/2016  . Spinal stenosis of lumbar region 04/25/2014  . Fibromyalgia syndrome 04/25/2014  . Urine incontinence 04/25/2014  . Senile osteoporosis 04/25/2014  . Insomnia 04/25/2014  . Right foot drop 04/27/2013  .  Abnormality of gait 04/27/2013  . Weakness   . Nausea 04/15/2012  . Hyperlipidemia 04/14/2012  . Pubic bone fracture ([place]) 04/14/2012  . Hypokalemia 04/14/2012  . Essential hypertension 08/18/2007  . Primary osteoarthritis of both hands 08/18/2007  . Foot drop, right foot 08/18/2007  . OTHER ACQUIRED DEFORMITY OF ANKLE AND FOOT OTHER 08/18/2007  . UNEQUAL LEG  LENGTH 08/18/2007  . COLONIC POLYPS, HX OF 08/18/2007    Past Medical History:  Diagnosis Date  . Cataract   . Fibromyalgia 04/25/2014  . Hyperlipidemia   . Hypertension   . Lesion of sciatic nerve   . Low back pain   . Memory loss   . Spinal stenosis of lumbar region 04/25/2014  . Tremor   . Weakness     Family History  Problem Relation Age of Onset  . Breast cancer Mother   . Heart disease Mother   . Dementia Father   . Cancer Sister     Esophageal    Past Surgical History:  Procedure Laterality Date  . ABDOMINAL HYSTERECTOMY  1963  . APPENDECTOMY    . CATARACT EXTRACTION W/ INTRAOCULAR LENS  IMPLANT, BILATERAL  2005 and 2007  . COLON SURGERY  1974   colon polyp  . COLON SURGERY  2002   colon polyps(2)  Dr Collene Mares  . COLONOSCOPY W/ POLYPECTOMY  1974and 2002  . EYE SURGERY  2005/2007   cataract renoval  Dr. Bing Plume  . KNEE SURGERY  2009   Dr. Ronnie Derby  . LUMBAR LAMINECTOMY  2012   L4-5 with spinous process fixation (06/22/11) Dr. Tommi Emery  . schwanoma  1987   residual right foot drop and right leg pain. Dr. Deirdre Pippins  . SMALL INTESTINE SURGERY  1990   bowel obstruction, Dr. Rebekah Chesterfield  . TONSILLECTOMY  1940   Social History   Social History Narrative   Patient is retired Public relations account executive and lives with her husband, (married since 1980) at [place] since 2013   Patient  is right handed. She drinks 1 cup of coffee per day.    She does not have any pets.   Former smoker for 20 years   Exercise none                           Objective: Vital Signs: BP 120/86   Pulse 88   Resp 14   Ht 5' 8"  (1.727 m)   Wt 183 lb (83 kg)   BMI 27.83 kg/m    Physical Exam  Constitutional: She is oriented to person, place, and time. She appears well-developed and well-nourished.  HENT:  Head: Normocephalic and atraumatic.  Eyes: EOM are normal. Pupils are equal, round, and reactive to light.  Cardiovascular: Normal rate, regular rhythm and normal heart sounds.  Exam  reveals no gallop and no friction rub.   No murmur heard. Pulmonary/Chest: Effort normal and breath sounds normal. She has no wheezes. She has no rales.  Abdominal: Soft. Bowel sounds are normal. She exhibits no distension. There is no tenderness. There is no guarding. No hernia.  Musculoskeletal: Normal range of motion. She exhibits no edema, tenderness or deformity.  Lymphadenopathy:    She has no cervical adenopathy.  Neurological: She is alert and oriented to person, place, and time. Coordination normal.  Skin: Skin is warm and dry. Capillary refill takes less than 2 seconds. No rash noted.  Psychiatric: She has a normal mood and affect. Her behavior is normal.  Nursing note and vitals reviewed.    Musculoskeletal Exam:  Good range of motion of all joints Grip strength is good and strong bilaterally Fiber myalgia tender points are 6 out of 18 positive to bilateral trapezius muscles, bilateral greater trochanter bursa, bilateral SI joint.   CDAI Exam: CDAI Homunculus Exam:   Joint Counts:  CDAI Tender Joint count: 0 CDAI Swollen Joint count: 0  Global Assessments:  Patient Global Assessment: 4 Provider Global Assessment: 4  CDAI Calculated Score: 8  Note that left knee is replaced and continues to be a source of pain for the patient  Right knee pain and is requesting a cortisone injection. We wanted patient to start Visco supplementation soon after January 2018 but patient has not heard anything so I will reapply.  Investigation: Findings:   Labs from 12/31/2015 show CBC with diff is normal except for hemoglobin elevated at 16.3.  Hematocrit elevated at 49.9.  CMP with glucose is elevated at 109.  Vitamin D is at 35 and she is taking 2000 international units every other day.  We advised the patient to take it once a day and that should improve her vitamin D.  We will check it in the future.  Hemoglobin A1C is 5.4.  Lipid panel shows elevated cholesterol at 254, elevated LDL  at 155.  TSH is normal at 2.24.  Urine is normal.   Admission on 09/13/2016, Discharged on 09/16/2016  Component Date Value Ref Range Status  . Specimen Description 09/13/2016 BLOOD LEFT ARM   Final  . Special Requests 09/13/2016 BOTTLES DRAWN AEROBIC AND ANAEROBIC 5ML   Final  . Culture 09/13/2016    Final                   Value:NO GROWTH 5 DAYS Performed at [facility], [place] [address] [place], [place] [address]   . Report Status 09/13/2016 09/18/2016 FINAL   Final  . Specimen Description 09/13/2016 BLOOD LEFT ANTECUBITAL   Final  . Special Requests 09/13/2016 BOTTLES DRAWN AEROBIC AND ANAEROBIC 5ML   Final  . Culture 09/13/2016    Final                   Value:NO GROWTH 5 DAYS Performed at [facility], [place] [address] [place],  [address]   . Report Status 09/13/2016 09/18/2016 FINAL   Final  . WBC 09/13/2016 11.1* 4.0 - 10.5 K/uL Final  . RBC 09/13/2016 4.51  3.87 - 5.11 MIL/uL Final  . Hemoglobin 09/13/2016 13.9  12.0 - 15.0 g/dL Final  . HCT 09/13/2016 42.1  36.0 - 46.0 % Final  . MCV 09/13/2016 93.3  78.0 - 100.0 fL Final  . MCH 09/13/2016 30.8  26.0 - 34.0 pg Final  . MCHC 09/13/2016 33.0  30.0 - 36.0 g/dL Final  . RDW 09/13/2016 14.3  11.5 - 15.5 % Final  . Platelets 09/13/2016 146* 150 - 400 K/uL Final  . Neutrophils Relative % 09/13/2016 82  % Final  . Neutro Abs 09/13/2016 9.1* 1.7 - 7.7 K/uL Final  . Lymphocytes Relative 09/13/2016 8  % Final  . Lymphs Abs 09/13/2016 0.9  0.7 - 4.0 K/uL Final  . Monocytes Relative 09/13/2016 10  % Final  . Monocytes Absolute 09/13/2016 1.1* 0.1 - 1.0 K/uL Final  . Eosinophils Relative 09/13/2016 0  % Final  . Eosinophils Absolute 09/13/2016 0.0  0.0 - 0.7 K/uL Final  . Basophils Relative 09/13/2016 0  % Final  .  Basophils Absolute 09/13/2016 0.0  0.0 - 0.1 K/uL Final  . Sodium 09/13/2016 137  135 - 145 mmol/L Final  . Potassium 09/13/2016 3.5  3.5 - 5.1 mmol/L Final  . Chloride 09/13/2016 103  101 - 111 mmol/L  Final  . CO2 09/13/2016 27  22 - 32 mmol/L Final  . Glucose, Bld 09/13/2016 129* 65 - 99 mg/dL Final  . BUN 09/13/2016 11  6 - 20 mg/dL Final  . Creatinine, Ser 09/13/2016 0.56  0.44 - 1.00 mg/dL Final  . Calcium 09/13/2016 9.1  8.9 - 10.3 mg/dL Final  . Total Protein 09/13/2016 6.8  6.5 - 8.1 g/dL Final  . Albumin 09/13/2016 3.7  3.5 - 5.0 g/dL Final  . AST 09/13/2016 23  15 - 41 U/L Final  . ALT 09/13/2016 21  14 - 54 U/L Final  . Alkaline Phosphatase 09/13/2016 62  38 - 126 U/L Final  . Total Bilirubin 09/13/2016 0.8  0.3 - 1.2 mg/dL Final  . GFR calc non Af Amer 09/13/2016 >60  >60 mL/min Final  . GFR calc Af Amer 09/13/2016 >60  >60 mL/min Final   Comment: (NOTE) The eGFR has been calculated using the CKD EPI equation. This calculation has not been validated in all clinical situations. eGFR's persistently <60 mL/min signify possible Chronic Kidney Disease.   . Anion gap 09/13/2016 7  5 - 15 Final  . Lactic Acid, Venous 09/13/2016 1.22  0.5 - 1.9 mmol/L Final  . Lactic Acid, Venous 09/13/2016 1.13  0.5 - 1.9 mmol/L Final  . Sodium 09/14/2016 139  135 - 145 mmol/L Final  . Potassium 09/14/2016 3.6  3.5 - 5.1 mmol/L Final  . Chloride 09/14/2016 107  101 - 111 mmol/L Final  . CO2 09/14/2016 26  22 - 32 mmol/L Final  . Glucose, Bld 09/14/2016 116* 65 - 99 mg/dL Final  . BUN 09/14/2016 9  6 - 20 mg/dL Final  . Creatinine, Ser 09/14/2016 0.58  0.44 - 1.00 mg/dL Final  . Calcium 09/14/2016 8.4* 8.9 - 10.3 mg/dL Final  . Total Protein 09/14/2016 6.2* 6.5 - 8.1 g/dL Final  . Albumin 09/14/2016 3.1* 3.5 - 5.0 g/dL Final  . AST 09/14/2016 23  15 - 41 U/L Final  . ALT 09/14/2016 22  14 - 54 U/L Final  . Alkaline Phosphatase 09/14/2016 53  38 - 126 U/L Final  . Total Bilirubin 09/14/2016 0.4  0.3 - 1.2 mg/dL Final  . GFR calc non Af Amer 09/14/2016 >60  >60 mL/min Final  . GFR calc Af Amer 09/14/2016 >60  >60 mL/min Final   Comment: (NOTE) The eGFR has been calculated using the CKD EPI  equation. This calculation has not been validated in all clinical situations. eGFR's persistently <60 mL/min signify possible Chronic Kidney Disease.   . Anion gap 09/14/2016 6  5 - 15 Final  . TSH 09/13/2016 1.897  0.350 - 4.500 uIU/mL Final  . Vitamin B-12 09/13/2016 478  180 - 914 pg/mL Final   Comment: (NOTE) This assay is not validated for testing neonatal or myeloproliferative syndrome specimens for Vitamin B12 levels. Performed at [facility], [place] [address], [place], [place] [address]   . Magnesium 09/14/2016 2.1  1.7 - 2.4 mg/dL Final  . MRSA by PCR 09/13/2016 NEGATIVE  NEGATIVE Final   Comment:        The GeneXpert MRSA Assay (FDA approved for NASAL specimens only), is one component of a comprehensive MRSA colonization surveillance program. It  is not intended to diagnose MRSA infection nor to guide or monitor treatment for MRSA infections.   . WBC 09/15/2016 6.8  4.0 - 10.5 K/uL Final  . RBC 09/15/2016 4.48  3.87 - 5.11 MIL/uL Final  . Hemoglobin 09/15/2016 13.6  12.0 - 15.0 g/dL Final  . HCT 09/15/2016 41.2  36.0 - 46.0 % Final  . MCV 09/15/2016 92.0  78.0 - 100.0 fL Final  . MCH 09/15/2016 30.4  26.0 - 34.0 pg Final  . MCHC 09/15/2016 33.0  30.0 - 36.0 g/dL Final  . RDW 09/15/2016 14.1  11.5 - 15.5 % Final  . Platelets 09/15/2016 196  150 - 400 K/uL Final  Admission on 09/12/2016, Discharged on 09/12/2016  Component Date Value Ref Range Status  . Lactic Acid, Venous 09/12/2016 1.72  0.5 - 1.9 mmol/L Final  . Sodium 09/12/2016 139  135 - 145 mmol/L Final  . Potassium 09/12/2016 3.6  3.5 - 5.1 mmol/L Final  . Chloride 09/12/2016 104  101 - 111 mmol/L Final  . CO2 09/12/2016 28  22 - 32 mmol/L Final  . Glucose, Bld 09/12/2016 125* 65 - 99 mg/dL Final  . BUN 09/12/2016 11  6 - 20 mg/dL Final  . Creatinine, Ser 09/12/2016 0.63  0.44 - 1.00 mg/dL Final  . Calcium 09/12/2016 9.1  8.9 - 10.3 mg/dL Final  . Total Protein 09/12/2016 6.5  6.5 - 8.1 g/dL Final    . Albumin 09/12/2016 3.6  3.5 - 5.0 g/dL Final  . AST 09/12/2016 21  15 - 41 U/L Final  . ALT 09/12/2016 15  14 - 54 U/L Final  . Alkaline Phosphatase 09/12/2016 57  38 - 126 U/L Final  . Total Bilirubin 09/12/2016 0.9  0.3 - 1.2 mg/dL Final  . GFR calc non Af Amer 09/12/2016 >60  >60 mL/min Final  . GFR calc Af Amer 09/12/2016 >60  >60 mL/min Final   Comment: (NOTE) The eGFR has been calculated using the CKD EPI equation. This calculation has not been validated in all clinical situations. eGFR's persistently <60 mL/min signify possible Chronic Kidney Disease.   . Anion gap 09/12/2016 7  5 - 15 Final  . WBC 09/12/2016 10.0  4.0 - 10.5 K/uL Final  . RBC 09/12/2016 4.72  3.87 - 5.11 MIL/uL Final  . Hemoglobin 09/12/2016 14.5  12.0 - 15.0 g/dL Final  . HCT 09/12/2016 43.4  36.0 - 46.0 % Final  . MCV 09/12/2016 91.9  78.0 - 100.0 fL Final  . MCH 09/12/2016 30.7  26.0 - 34.0 pg Final  . MCHC 09/12/2016 33.4  30.0 - 36.0 g/dL Final  . RDW 09/12/2016 14.0  11.5 - 15.5 % Final  . Platelets 09/12/2016 160  150 - 400 K/uL Final  . Neutrophils Relative % 09/12/2016 79  % Final  . Neutro Abs 09/12/2016 7.9* 1.7 - 7.7 K/uL Final  . Lymphocytes Relative 09/12/2016 8  % Final  . Lymphs Abs 09/12/2016 0.8  0.7 - 4.0 K/uL Final  . Monocytes Relative 09/12/2016 13  % Final  . Monocytes Absolute 09/12/2016 1.3* 0.1 - 1.0 K/uL Final  . Eosinophils Relative 09/12/2016 0  % Final  . Eosinophils Absolute 09/12/2016 0.0  0.0 - 0.7 K/uL Final  . Basophils Relative 09/12/2016 0  % Final  . Basophils Absolute 09/12/2016 0.0  0.0 - 0.1 K/uL Final  . Specimen Description 09/12/2016 BLOOD LEFT ARM   Final  . Special Requests 09/12/2016 BOTTLES DRAWN AEROBIC AND ANAEROBIC  UNKNOWN  Final  . Culture 09/12/2016    Final                   Value:NO GROWTH 5 DAYS Performed at [facility], [place] [address] [place], [place] [address]   . Report Status 09/12/2016 09/17/2016 FINAL   Final  . Specimen  Description 09/12/2016 BLOOD LEFT ARM   Final  . Special Requests 09/12/2016 BOTTLES DRAWN AEROBIC AND ANAEROBIC 5 CC EACH   Final  . Culture  Setup Time 09/12/2016    Final                   Value:GRAM NEGATIVE RODS IN BOTH AEROBIC AND ANAEROBIC BOTTLES Organism ID to follow CRITICAL RESULT CALLED TO, READ BACK BY AND VERIFIED WITH: S MAYNARD,RN @0618  09/13/16 MKELLY,MLT   . Culture 09/12/2016 *  Final                   Value:ESCHERICHIA COLI Confirmed Extended Spectrum Beta-Lactamase Producer (ESBL) Performed at [facility], [place] [address] [place], [place] [address]   . Report Status 09/12/2016 09/15/2016 FINAL   Final  . Organism ID, Bacteria 09/12/2016 ESCHERICHIA COLI   Final  . Color, Urine 09/12/2016 YELLOW  YELLOW Final  . APPearance 09/12/2016 HAZY* CLEAR Final  . Specific Gravity, Urine 09/12/2016 1.006  1.005 - 1.030 Final  . pH 09/12/2016 6.0  5.0 - 8.0 Final  . Glucose, UA 09/12/2016 NEGATIVE  NEGATIVE mg/dL Final  . Hgb urine dipstick 09/12/2016 SMALL* NEGATIVE Final  . Bilirubin Urine 09/12/2016 NEGATIVE  NEGATIVE Final  . Ketones, ur 09/12/2016 NEGATIVE  NEGATIVE mg/dL Final  . Protein, ur 09/12/2016 NEGATIVE  NEGATIVE mg/dL Final  . Nitrite 09/12/2016 POSITIVE* NEGATIVE Final  . Leukocytes, UA 09/12/2016 LARGE* NEGATIVE Final  . RBC / HPF 09/12/2016 0-5  0 - 5 RBC/hpf Final  . WBC, UA 09/12/2016 TOO NUMEROUS TO COUNT  0 - 5 WBC/hpf Final  . Bacteria, UA 09/12/2016 MANY* NONE SEEN Final  . Squamous Epithelial / LPF 09/12/2016 NONE SEEN  NONE SEEN Final  . Mucous 09/12/2016 PRESENT   Final  . Specimen Description 09/12/2016 URINE, CATHETERIZED   Final  . Special Requests 09/12/2016 NONE   Final  . Culture 09/12/2016 *  Final                   Value:>=100,000 COLONIES/mL ESCHERICHIA COLI Confirmed Extended Spectrum Beta-Lactamase Producer (ESBL) Performed at [facility], [place] [address] [place], [place] [address]   . Report Status 09/12/2016  09/14/2016 FINAL   Final  . Organism ID, Bacteria 09/12/2016 ESCHERICHIA COLI*  Final  . Lactic Acid, Venous 09/12/2016 0.89  0.5 - 1.9 mmol/L Final  . Enterococcus species 09/12/2016 NOT DETECTED  NOT DETECTED Final  . Listeria monocytogenes 09/12/2016 NOT DETECTED  NOT DETECTED Final  . Staphylococcus species 09/12/2016 NOT DETECTED  NOT DETECTED Final  . Staphylococcus aureus 09/12/2016 NOT DETECTED  NOT DETECTED Final  . Streptococcus species 09/12/2016 NOT DETECTED  NOT DETECTED Final  . Streptococcus agalactiae 09/12/2016 NOT DETECTED  NOT DETECTED Final  . Streptococcus pneumoniae 09/12/2016 NOT DETECTED  NOT DETECTED Final  . Streptococcus pyogenes 09/12/2016 NOT DETECTED  NOT DETECTED Final  . Acinetobacter baumannii 09/12/2016 NOT DETECTED  NOT DETECTED Final  . Enterobacteriaceae species 09/12/2016 DETECTED* NOT DETECTED Final   Comment: Enterobacteriaceae represent a large family of gram-negative bacteria, not a single organism. CRITICAL RESULT CALLED TO, READ BACK BY  AND VERIFIED WITH: S MAYNARD,RN @0618  09/13/16 MKELLY,MLT   . Enterobacter cloacae complex 09/12/2016 NOT DETECTED  NOT DETECTED Final  . Escherichia coli 09/12/2016 DETECTED* NOT DETECTED Final   Comment: CRITICAL RESULT CALLED TO, READ BACK BY AND VERIFIED WITH: S MAYNARD,RN @0618  09/13/16 MKELLY,MLT   . Klebsiella oxytoca 09/12/2016 NOT DETECTED  NOT DETECTED Final  . Klebsiella pneumoniae 09/12/2016 NOT DETECTED  NOT DETECTED Final  . Proteus species 09/12/2016 NOT DETECTED  NOT DETECTED Final  . Serratia marcescens 09/12/2016 NOT DETECTED  NOT DETECTED Final  . Carbapenem resistance 09/12/2016 NOT DETECTED  NOT DETECTED Final  . Haemophilus influenzae 09/12/2016 NOT DETECTED  NOT DETECTED Final  . Neisseria meningitidis 09/12/2016 NOT DETECTED  NOT DETECTED Final  . Pseudomonas aeruginosa 09/12/2016 NOT DETECTED  NOT DETECTED Final  . Candida albicans 09/12/2016 NOT DETECTED  NOT DETECTED Final  . Candida  glabrata 09/12/2016 NOT DETECTED  NOT DETECTED Final  . Candida krusei 09/12/2016 NOT DETECTED  NOT DETECTED Final  . Candida parapsilosis 09/12/2016 NOT DETECTED  NOT DETECTED Final  . Candida tropicalis 09/12/2016 NOT DETECTED  NOT DETECTED Final  Telephone on 08/18/2016  Component Date Value Ref Range Status  . Hemoglobin 08/19/2016 15.7  12.0 - 16.0 g/dL Final  . HCT 08/19/2016 46  36 - 46 % Final  . Platelets 08/19/2016 225  150 - 399 K/L Final  . WBC 08/19/2016 6.2  10^3/mL Final  . Glucose 08/19/2016 108  mg/dL Final  . BUN 08/19/2016 12  4 - 21 mg/dL Final  . Creatinine 08/19/2016 0.7  0.5 - 1.1 mg/dL Final  . Potassium 08/19/2016 42.0* 3.4 - 5.3 mmol/L Final  . Sodium 08/19/2016 139  137 - 147 mmol/L Final  . Alkaline Phosphatase 08/19/2016 62  25 - 125 U/L Final  . ALT 08/19/2016 11  7 - 35 U/L Final  . AST 08/19/2016 16  13 - 35 U/L Final  . Bilirubin, Total 08/19/2016 0.6  mg/dL Final      Imaging: No results found.  Speciality Comments: No specialty comments available.    Procedures:  Large Joint Inj Date/Time: 10/19/2016 11:20 AM Performed by: Eliezer Lofts Authorized by: Eliezer Lofts   Consent Given by:  Patient Site marked: the procedure site was marked   Timeout: prior to procedure the correct patient, procedure, and site was verified   Indications:  Pain and joint swelling Location:  Knee Prep: patient was prepped and draped in usual sterile fashion   Needle Size:  27 G Needle Length:  1.5 inches Approach:  Medial Ultrasound Guidance: No   Fluoroscopic Guidance: No   Arthrogram: No   Medications:  1.5 mL lidocaine 1 %; 40 mg triamcinolone acetonide 40 MG/ML Aspiration Attempted: Yes   Patient tolerance:  Patient tolerated the procedure well with no immediate complications   Allergies: Bactrim [sulfamethoxazole-trimethoprim] and Restoril [temazepam]   Assessment / Plan:     Visit Diagnoses: Fibromyalgia syndrome  Primary osteoarthritis  of both hands - DIP and PIP prominence bilaterally with the left CMC subluxation.   Vitamin D deficiency  Primary osteoarthritis of both knees - OA Right Joint, left TKR  Bilateral leg weakness - History of stenosis  DDD (degenerative disc disease), lumbar  Osteopenia of multiple sites  History of hypertension   Plan: Fibromyalgia syndrome. Active disease with Blanch Media pain 6 out of 18 tender points #2: Status post emergency room visit for UTI. Was intended to be treated as outpatient with ciprofloxacin. That same evening patient went  home and fell. Was asked to come back in to the hospital at 5 AM due to blood cultures coming back positive. Was admitted to the hospital and treated with IV antibiotics. Please see Epic for full details. Patient did well with her hospitalization. And is recovered  #3: Complaining of right knee joint pain. Patient would benefit from Visco supplementation as soon as possible. We applied for Visco supplementation January 2018 but have not heard anything back and so we'll reapply again today with the hopes that patient gets Visco as soon as possible. Patient is having left total knee replacement pain that is ongoing and uncomfortable. She ambulates with a Rollator for stability.  #4: Right knee pain. Patient requests cortisone injection. 40 mg of Kenalog mixed with one half mL's 1% lidocaine injected. Patient tolerated procedure well. There are no Caucasian  Orders: Orders Placed This Encounter  Procedures  . Large Joint Injection/Arthrocentesis   No orders of the defined types were placed in this encounter.   Face-to-face time spent with patient was 30 minutes. 50% of time was spent in counseling and coordination of care.  Follow-Up Instructions: Return in about 5 months (around 03/21/2017) for FMS, FATIGUE,INSOMNIA, rt knee pain (kenalog), then visco applied asap.   Eliezer Lofts, PA-C  Note - This record has been created using [employer].  Chart creation errors have been sought, but may not always  have been located. Such creation errors do not reflect on  the standard of medical care.

## 2016-10-21 ENCOUNTER — Non-Acute Institutional Stay: Payer: Medicare Other | Admitting: Internal Medicine

## 2016-10-21 ENCOUNTER — Encounter: Payer: Self-pay | Admitting: Internal Medicine

## 2016-10-21 VITALS — BP 130/84 | HR 82 | Temp 98.5°F | Ht 68.0 in | Wt 189.0 lb

## 2016-10-21 DIAGNOSIS — G4700 Insomnia, unspecified: Secondary | ICD-10-CM

## 2016-10-21 DIAGNOSIS — N390 Urinary tract infection, site not specified: Secondary | ICD-10-CM

## 2016-10-21 DIAGNOSIS — F329 Major depressive disorder, single episode, unspecified: Secondary | ICD-10-CM

## 2016-10-21 DIAGNOSIS — R413 Other amnesia: Secondary | ICD-10-CM

## 2016-10-21 DIAGNOSIS — R490 Dysphonia: Secondary | ICD-10-CM

## 2016-10-21 DIAGNOSIS — I10 Essential (primary) hypertension: Secondary | ICD-10-CM

## 2016-10-21 DIAGNOSIS — F32A Depression, unspecified: Secondary | ICD-10-CM

## 2016-10-21 DIAGNOSIS — R269 Unspecified abnormalities of gait and mobility: Secondary | ICD-10-CM

## 2016-10-21 DIAGNOSIS — M48061 Spinal stenosis, lumbar region without neurogenic claudication: Secondary | ICD-10-CM | POA: Diagnosis not present

## 2016-10-21 DIAGNOSIS — M21371 Foot drop, right foot: Secondary | ICD-10-CM

## 2016-10-21 MED ORDER — SULFAMETHOXAZOLE-TRIMETHOPRIM 800-160 MG PO TABS: ORAL_TABLET | ORAL | 0 refills | Status: DC

## 2016-10-21 NOTE — Progress Notes (Signed)
Facility  [facility]: [facility] (12) OFFICE    Allergies  Allergen Reactions  . Bactrim [Sulfamethoxazole-Trimethoprim] Nausea And Vomiting  . Restoril [Temazepam]     Nightmares    Chief Complaint  Patient presents with  . Medical Management of Chronic Issues    New patient follow-up. Saw ManXie as new patient 08/12/2016. Blood pressure, insomnia, depression.  Marland Kitchen MMSE    29/30 passed clock drawing    HPI:  Recurrent UTI - hospitalized from 09/13/16 to 09/16/16 for UTI with bacteriuria. E. Coli with multiple resistances. She noiw checks urine with Dipstick and it turned nitrite positive and indicated large amount leukocytes today. Denies dysuria.  Essential hypertension - controlled  Memory deficit - had been on Aricept for about 10 years, but stopped it recently. Never had much of a memory issue, but her father had se ver dementia of likely Alzheimer variety.   Spinal stenosis of lumbar region, unspecified whether neurogenic claudication present - continues to have sharp and sever pains down the right leg despite surgery in 2012.   Dysphonia - noted in the last 3 years. Rattling as she speaks. No pain or dysphagia. a  Right foot drop - as a result of the back surgery in 2012  Insomnia, unspecified type - better with Ambien  Depression, unspecified depression type - denies much of a problem. Uses sertraline.  Abnormality of gait - unstable balance. Using 4 wheel walker with brakes and seat.    Medications: Patient's Medications  New Prescriptions   No medications on file  Previous Medications   ACETAMINOPHEN (TYLENOL) 500 MG TABLET    Take 500 mg by mouth. Take 2 tablets at night   ACIDOPHILUS (RISAQUAD) CAPS    Take 1 capsule by mouth daily.   AMLODIPINE (NORVASC) 10 MG TABLET    Take 1 tablet (10 mg total) by mouth daily.   ASPIRIN 81 MG CHEWABLE TABLET    Chew 81 mg by mouth daily.   CHOLECALCIFEROL (VITAMIN D) 1000 UNITS TABLET    Take 1,000 Units by  mouth daily. Take 2,000 units daily   DICLOFENAC SODIUM (VOLTAREN) 1 % GEL    Apply 2 g topically 2 (two) times daily.   FLUTICASONE (FLONASE) 50 MCG/ACT NASAL SPRAY    Place 2 sprays into both nostrils daily. 2 sprays in each nostril once daily   GABAPENTIN (NEURONTIN) 800 MG TABLET    Take 1 tablet (800 mg total) by mouth 3 (three) times daily.   LOSARTAN (COZAAR) 50 MG TABLET    Take 1 tablet (50 mg total) by mouth daily.   METHENAMINE (HIPREX) 1 G TABLET    Take 1 g by mouth 2 (two) times daily with a meal.   METOPROLOL TARTRATE (LOPRESSOR) 25 MG TABLET    Take 1 tablet (25 mg total) by mouth 2 (two) times daily. To control blood pressure   MIRABEGRON ER (MYRBETRIQ) 25 MG TB24 TABLET    Take 1 tablet (25 mg total) by mouth daily.   MULTIPLE VITAMIN (MULTIVITAMIN WITH MINERALS) TABS    Take 1 tablet by mouth daily.   SERTRALINE (ZOLOFT) 50 MG TABLET    Take 1 tablet (50 mg total) by mouth daily.   SIMVASTATIN (ZOCOR) 10 MG TABLET    Take 1 tablet (10 mg total) by mouth at bedtime.   SOLIFENACIN (VESICARE) 10 MG TABLET    Take 1 tablet (10 mg total) by mouth daily.   VITAMIN C (ASCORBIC  ACID) 500 MG TABLET    Take 500 mg by mouth daily.   ZOLPIDEM (AMBIEN) 10 MG TABLET    TAKE 1 TABLET AT BEDTIME AS NEEDED.  Modified Medications   No medications on file  Discontinued Medications   DONEPEZIL (ARICEPT) 10 MG TABLET    Take 1 tablet (10 mg total) by mouth 2 (two) times daily.   MAGNESIUM 250 MG TABS    Take 250 mg by mouth daily. Take one tablet daily    ONDANSETRON (ZOFRAN) 4 MG TABLET    Take 1 tablet (4 mg total) by mouth every 8 (eight) hours as needed for nausea or vomiting. Take as needed 30 minutes before Bactrim dose.    Review of Systems  Constitutional: Positive for fatigue. Negative for activity change, appetite change, chills, diaphoresis, fever and unexpected weight change.  HENT: Positive for voice change (rattling voice). Negative for congestion, ear discharge, ear pain, hearing  loss, postnasal drip, rhinorrhea, sore throat, tinnitus and trouble swallowing.   Eyes: Negative for pain, redness, itching and visual disturbance.  Respiratory: Negative for cough, choking, shortness of breath and wheezing.   Cardiovascular: Negative for chest pain, palpitations and leg swelling.  Gastrointestinal: Negative for abdominal distention, abdominal pain, constipation, diarrhea and nausea.       Indigestion on Urex.  Endocrine: Negative for cold intolerance, heat intolerance, polydipsia, polyphagia and polyuria.  Genitourinary: Positive for frequency and urgency. Negative for difficulty urinating, dysuria, flank pain, hematuria, pelvic pain and vaginal discharge.       Urinary incontinence. Recurrent UTI. OAB better on Myrbetriq and Vesicare combination.  Musculoskeletal: Positive for back pain (lumbar spinal stenosis post laminectomy) and gait problem (cane and walker). Negative for arthralgias, myalgias, neck pain and neck stiffness.       Hx of fx of pubic bones. Right foot drop. Fibromyalgia with variable discomfort.  Skin: Negative for color change, pallor and rash.  Allergic/Immunologic: Negative.   Neurological: Negative for dizziness, tremors, seizures, syncope, weakness, numbness and headaches.       Lower body weakness, ambulates with walker. Memory loss. Sharp pains go downt he right leg sometimes. Better with gabapentin.  Hematological: Negative for adenopathy. Does not bruise/bleed easily.  Psychiatric/Behavioral: Positive for sleep disturbance. Negative for agitation, behavioral problems, confusion, dysphoric mood, hallucinations and suicidal ideas. The patient is nervous/anxious. The patient is not hyperactive.     Vitals:   10/21/16 1356  BP: 130/84  Pulse: 82  Temp: 98.5 F (36.9 C)  TempSrc: Oral  SpO2: 94%  Weight: 189 lb (85.7 kg)  Height: 5' 8"  (1.727 m)   Body mass index is 28.74 kg/m. Wt Readings from Last 3 Encounters:  10/21/16 189 lb (85.7  kg)  10/19/16 183 lb (83 kg)  09/30/16 186 lb 6.4 oz (84.6 kg)      Physical Exam  Constitutional: She is oriented to person, place, and time. She appears well-developed and well-nourished. No distress.  HENT:  Right Ear: External ear normal.  Left Ear: External ear normal.  Nose: Nose normal.  Mouth/Throat: Oropharynx is clear and moist. No oropharyngeal exudate.  Eyes: Conjunctivae and EOM are normal. Pupils are equal, round, and reactive to light. No scleral icterus.  Neck: No JVD present. No tracheal deviation present. No thyromegaly present.  Cardiovascular: Normal rate, regular rhythm, normal heart sounds and intact distal pulses.  Exam reveals no gallop and no friction rub.   No murmur heard. Pulmonary/Chest: Effort normal. No respiratory distress. She has no wheezes. She has  no rales. She exhibits no tenderness.  Abdominal: She exhibits no distension and no mass. There is no tenderness.  Musculoskeletal: Normal range of motion. She exhibits no edema or tenderness.  Right foot drop. Using AFO.  Lymphadenopathy:    She has no cervical adenopathy.  Neurological: She is alert and oriented to person, place, and time. No cranial nerve deficit. Coordination normal.  Right foot drop, brace. 10/21/16 MMSE 29/30. Passed clock drawing.  Skin: No rash noted. She is not diaphoretic. No erythema. No pallor.  Psychiatric: She has a normal mood and affect. Her behavior is normal. Judgment and thought content normal.    Labs reviewed: Lab Summary Latest Ref Rng & Units 09/15/2016 09/14/2016 09/13/2016 09/12/2016  Hemoglobin 12.0 - 15.0 g/dL 13.6 (None) 13.9 14.5  Hematocrit 36.0 - 46.0 % 41.2 (None) 42.1 43.4  White count 4.0 - 10.5 K/uL 6.8 (None) 11.1(H) 10.0  Platelet count 150 - 400 K/uL 196 (None) 146(L) 160  Sodium 135 - 145 mmol/L (None) 139 137 139  Potassium 3.5 - 5.1 mmol/L (None) 3.6 3.5 3.6  Calcium 8.9 - 10.3 mg/dL (None) 8.4(L) 9.1 9.1  Phosphorus - (None) (None) (None) (None)   Creatinine 0.44 - 1.00 mg/dL (None) 0.58 0.56 0.63  AST 15 - 41 U/L (None) 23 23 21   Alk Phos 38 - 126 U/L (None) 53 62 57  Bilirubin 0.3 - 1.2 mg/dL (None) 0.4 0.8 0.9  Glucose 65 - 99 mg/dL (None) 116(H) 129(H) 125(H)  Cholesterol - (None) (None) (None) (None)  HDL cholesterol - (None) (None) (None) (None)  Triglycerides - (None) (None) (None) (None)  LDL Direct - (None) (None) (None) (None)  LDL Calc - (None) (None) (None) (None)  Total protein 6.5 - 8.1 g/dL (None) 6.2(L) 6.8 6.5  Albumin 3.5 - 5.0 g/dL (None) 3.1(L) 3.7 3.6  Some recent data might be hidden   Lab Results  Component Value Date   TSH 1.897 09/13/2016   TSH 1.81 02/18/2014   Lab Results  Component Value Date   BUN 9 09/14/2016   BUN 11 09/13/2016   BUN 11 09/12/2016   No results found for: HGBA1C  Assessment/Plan  1. Recurrent UTI - sulfamethoxazole-trimethoprim (BACTRIM DS,SEPTRA DS) 800-160 MG tablet; One twice daily to treat infection  Dispense: 20 tablet; Refill: 0  2. Essential hypertension The current medical regimen is effective;  continue present plan and medications.  3. Memory deficit OK to stay off Aricept. Repeat MMSE later this year  4. Spinal stenosis of lumbar region, unspecified whether neurogenic claudication present  5. Dysphonia observe  6. Right foot drop Continue AFO  7. Insomnia, unspecified type The current medical regimen is effective;  continue present plan and medications.  8. Depression, unspecified depression type The current medical regimen is effective;  continue present plan and medications.  9. Abnormality of gait Continue walker

## 2016-10-28 ENCOUNTER — Non-Acute Institutional Stay: Payer: Medicare Other | Admitting: Nurse Practitioner

## 2016-10-28 ENCOUNTER — Encounter: Payer: Self-pay | Admitting: Nurse Practitioner

## 2016-10-28 ENCOUNTER — Other Ambulatory Visit: Payer: Medicare Other

## 2016-10-28 ENCOUNTER — Other Ambulatory Visit: Payer: Self-pay

## 2016-10-28 DIAGNOSIS — F329 Major depressive disorder, single episode, unspecified: Secondary | ICD-10-CM | POA: Diagnosis not present

## 2016-10-28 DIAGNOSIS — I10 Essential (primary) hypertension: Secondary | ICD-10-CM

## 2016-10-28 DIAGNOSIS — R413 Other amnesia: Secondary | ICD-10-CM | POA: Diagnosis not present

## 2016-10-28 DIAGNOSIS — G4700 Insomnia, unspecified: Secondary | ICD-10-CM | POA: Diagnosis not present

## 2016-10-28 DIAGNOSIS — N3941 Urge incontinence: Secondary | ICD-10-CM | POA: Diagnosis not present

## 2016-10-28 DIAGNOSIS — M48061 Spinal stenosis, lumbar region without neurogenic claudication: Secondary | ICD-10-CM

## 2016-10-28 DIAGNOSIS — N390 Urinary tract infection, site not specified: Secondary | ICD-10-CM

## 2016-10-28 DIAGNOSIS — F32A Depression, unspecified: Secondary | ICD-10-CM

## 2016-10-28 LAB — CBC WITH DIFFERENTIAL/PLATELET
BASOS PCT: 0 %
Basophils Absolute: 0 cells/uL (ref 0–200)
EOS ABS: 0 {cells}/uL — AB (ref 15–500)
Eosinophils Relative: 0 %
HEMATOCRIT: 47.2 % — AB (ref 35.0–45.0)
HEMOGLOBIN: 16.2 g/dL — AB (ref 11.7–15.5)
Lymphocytes Relative: 26 %
Lymphs Abs: 2444 cells/uL (ref 850–3900)
MCH: 31.6 pg (ref 27.0–33.0)
MCHC: 34.3 g/dL (ref 32.0–36.0)
MCV: 92 fL (ref 80.0–100.0)
MONO ABS: 846 {cells}/uL (ref 200–950)
MPV: 9.8 fL (ref 7.5–12.5)
Monocytes Relative: 9 %
NEUTROS ABS: 6110 {cells}/uL (ref 1500–7800)
Neutrophils Relative %: 65 %
Platelets: 285 10*3/uL (ref 140–400)
RBC: 5.13 MIL/uL — ABNORMAL HIGH (ref 3.80–5.10)
RDW: 13.8 % (ref 11.0–15.0)
WBC: 9.4 10*3/uL (ref 3.8–10.8)

## 2016-10-28 LAB — CMP AND LIVER
ALBUMIN: 4.3 g/dL (ref 3.6–5.1)
ALT: 11 U/L (ref 6–29)
AST: 17 U/L (ref 10–35)
Alkaline Phosphatase: 58 U/L (ref 33–130)
BILIRUBIN INDIRECT: 0.3 mg/dL (ref 0.2–1.2)
BUN: 12 mg/dL (ref 7–25)
Bilirubin, Direct: 0.1 mg/dL (ref <=0.2)
CO2: 29 mmol/L (ref 20–31)
CREATININE: 0.69 mg/dL (ref 0.60–0.88)
Calcium: 10.2 mg/dL (ref 8.6–10.4)
Chloride: 100 mmol/L (ref 98–110)
GLUCOSE: 113 mg/dL — AB (ref 65–99)
POTASSIUM: 4.3 mmol/L (ref 3.5–5.3)
SODIUM: 136 mmol/L (ref 135–146)
Total Bilirubin: 0.4 mg/dL (ref 0.2–1.2)
Total Protein: 7 g/dL (ref 6.1–8.1)

## 2016-10-28 LAB — HEPATIC FUNCTION PANEL
ALT: 11 U/L (ref 6–29)
AST: 17 U/L (ref 10–35)
Albumin: 4.3 g/dL (ref 3.6–5.1)
Alkaline Phosphatase: 58 U/L (ref 33–130)
BILIRUBIN INDIRECT: 0.3 mg/dL (ref 0.2–1.2)
Bilirubin, Direct: 0.1 mg/dL (ref <=0.2)
TOTAL PROTEIN: 7 g/dL (ref 6.1–8.1)
Total Bilirubin: 0.4 mg/dL (ref 0.2–1.2)

## 2016-10-28 NOTE — Assessment & Plan Note (Signed)
Continue taking Vesicare and Myrbetriq

## 2016-10-28 NOTE — Assessment & Plan Note (Signed)
Memory lapses, continue Aricept

## 2016-10-28 NOTE — Assessment & Plan Note (Signed)
Not sleeping well. Continue Ambien 10mg  prn hs.

## 2016-10-28 NOTE — Assessment & Plan Note (Signed)
On Bactrim a week, denied dysuria, fever, but c/o generalized weakness, nausea, vomiting, poor appetite, constipation, or diarrhea. Given hx of uroseptic episode and recurrent nature of UTI, will update CBC CMP UA C/S

## 2016-10-28 NOTE — Assessment & Plan Note (Signed)
Stable. Continue Sertraline 50mg 

## 2016-10-28 NOTE — Assessment & Plan Note (Addendum)
Lower leg weakness, s/p surgery, failed, ambulates with walker, History of surgery 2012 with residual right foot drop, continue Gabapentin 800mg  tid.

## 2016-10-28 NOTE — Progress Notes (Signed)
Provider:  Marlana Latus NP  Location:   [facility]   Place of Service:  Clinic ([phone])  PCP: Jeanmarie Hubert, MD Patient Care Team: Estill Dooms, MD as PCP - General (Internal Medicine) Bo Merino, MD as Consulting Physician (Rheumatology) Juanita Craver, MD as Consulting Physician (Gastroenterology) Vickey Huger, MD as Consulting Physician (Orthopedic Surgery) Calvert Cantor, MD as Consulting Physician (Ophthalmology) Alaysiah Browder Otho Darner, NP as Nurse Practitioner (Internal Medicine)  Extended Emergency Contact Information Primary Emergency Contact: Gruendler,Joseph Address: [address] [place] Phone: [phone] Mobile Phone: [phone] Relation: Spouse  Code Status: DNR Goals of Care: Advanced Directive information Advanced Directives 10/28/2016  Does Patient Have a Medical Advance Directive? Yes  Type of Paramedic of [place];Living will  Does patient want to make changes to medical advance directive? No - Patient declined  Copy of [place] in Chart? Yes  Would patient like information on creating a medical advance directive? -  Pre-existing out of facility DNR order (yellow form or pink MOST form) -      Chief Complaint  Patient presents with  . Medical Management of Chronic Issues    elevated BP    HPI: Patient is a 83 y.o. female seen today for UTI per home testing strip a week ago, empirically Bactrim started, the patient has experienced nausea, vomiting, elevated BP, generalized weakness, saw her in w/c today.  Denied fever, dysuria, or lower back pain.    09/13/16 to 09/16/16 for UTI, fully treated with Bactrim and f/u Urology. 09/23/16 wbc 6.9, Hgb 14.9, plt 346, Na 136, K 4.7, Bun 16, creat 0.81.   Hx of HTN, not controlled, taking Amlodipine 10mg , Losartan 50mg , Metoprolol 25mg  bid,  Insomnia Ambien 10mg , depression stable on Sertraline 50mg , incontinent 2-3 x/night, f/u Urology, taking  Vesicare and Myrbetriq, Memory lapses, taking Aricept, neuropathy, her neurologists passed away and retired. Taking Gabapentin. Fibromyalgia taking Tylenol 2 tabs at 4pm, Ibuprofen bid.   Past Medical History:  Diagnosis Date  . Cataract   . Fibromyalgia 04/25/2014  . Hyperlipidemia   . Hypertension   . Lesion of sciatic nerve   . Low back pain   . Memory loss   . Spinal stenosis of lumbar region 04/25/2014  . Tremor   . Weakness    Past Surgical History:  Procedure Laterality Date  . ABDOMINAL HYSTERECTOMY  1963  . APPENDECTOMY    . CATARACT EXTRACTION W/ INTRAOCULAR LENS  IMPLANT, BILATERAL  2005 and 2007  . COLON SURGERY  1974   colon polyp  . COLON SURGERY  2002   colon polyps(2)  Dr Collene Mares  . COLONOSCOPY W/ POLYPECTOMY  1974and 2002  . EYE SURGERY  2005/2007   cataract renoval  Dr. Bing Plume  . KNEE SURGERY  2009   Dr. Ronnie Derby  . LUMBAR LAMINECTOMY  2012   L4-5 with spinous process fixation (06/22/11) Dr. Tommi Emery  . schwanoma  1987   residual right foot drop and right leg pain. Dr. Deirdre Pippins  . SMALL INTESTINE SURGERY  1990   bowel obstruction, Dr. Rebekah Chesterfield  . TONSILLECTOMY  1940    reports that she quit smoking about 42 years ago. She has never used smokeless tobacco. She reports that she drinks about 1.2 oz of alcohol per week . She reports that she does not use drugs. Social History   Social History  . Marital status: Married  Spouse name: N/A  . Number of children: 4  . Years of education: N/A   Occupational History  . retired Public relations account executive  Retired    retired   Social History Main Topics  . Smoking status: Former Smoker    Quit date: 04/25/1974  . Smokeless tobacco: Never Used     Comment: Quit thirty years ago.  . Alcohol use 1.2 oz/week    2 Glasses of wine per week     Comment: 2 glasses of wine daily  . Drug use: No  . Sexual activity: Not on file   Other Topics Concern  . Not on file   Social History Narrative   Patient is retired Public relations account executive and lives  with her husband, (married since 1980) at [place] since 2013   Patient  is right handed. She drinks 1 cup of coffee per day.    She does not have any pets.   Former smoker for 20 years   Exercise none                          Functional Status Survey:    Family History  Problem Relation Age of Onset  . Breast cancer Mother   . Heart disease Mother   . Dementia Father   . Cancer Sister     Esophageal     Health Maintenance  Topic Date Due  . TETANUS/TDAP  08/20/1949  . DEXA SCAN  08/21/1995  . PNA vac Low Risk Adult (1 of 2 - PCV13) 08/21/1995  . INFLUENZA VACCINE  02/23/2017    Allergies  Allergen Reactions  . Restoril [Temazepam]     Nightmares  . Bactrim [Sulfamethoxazole-Trimethoprim] Nausea And Vomiting    Allergies as of 10/28/2016      Reactions   Restoril [temazepam]    Nightmares   Bactrim [sulfamethoxazole-trimethoprim] Nausea And Vomiting      Medication List       Accurate as of 10/28/16  3:52 PM. Always use your most recent med list.          acetaminophen 500 MG tablet Commonly known as:  TYLENOL Take 500 mg by mouth. Take 2 tablets at night   acidophilus Caps capsule Take 1 capsule by mouth daily.   amLODipine 10 MG tablet Commonly known as:  NORVASC Take 1 tablet (10 mg total) by mouth daily.   aspirin 81 MG chewable tablet Chew 81 mg by mouth daily.   cholecalciferol 1000 units tablet Commonly known as:  VITAMIN D Take 1,000 Units by mouth daily. Take 2,000 units daily   diclofenac sodium 1 % Gel Commonly known as:  VOLTAREN Apply 2 g topically 2 (two) times daily.   fluticasone 50 MCG/ACT nasal spray Commonly known as:  FLONASE Place 2 sprays into both nostrils daily. 2 sprays in each nostril once daily   gabapentin 800 MG tablet Commonly known as:  NEURONTIN Take 1 tablet (800 mg total) by mouth 3 (three) times daily.   losartan 50 MG tablet Commonly known as:  COZAAR Take 1 tablet (50 mg total) by  mouth daily.   methenamine 1 g tablet Commonly known as:  HIPREX Take 1 g by mouth 2 (two) times daily with a meal.   metoprolol tartrate 25 MG tablet Commonly known as:  LOPRESSOR Take 1 tablet (25 mg total) by mouth 2 (two) times daily. To control blood pressure   mirabegron ER 25 MG Tb24 tablet Commonly known as:  MYRBETRIQ Take 1 tablet (25 mg total) by mouth daily.   multivitamin with minerals Tabs tablet Take 1 tablet by mouth daily.   sertraline 50 MG tablet Commonly known as:  ZOLOFT Take 1 tablet (50 mg total) by mouth daily.   simvastatin 10 MG tablet Commonly known as:  ZOCOR Take 1 tablet (10 mg total) by mouth at bedtime.   solifenacin 10 MG tablet Commonly known as:  VESICARE Take 1 tablet (10 mg total) by mouth daily.   sulfamethoxazole-trimethoprim 800-160 MG tablet Commonly known as:  BACTRIM DS,SEPTRA DS One twice daily to treat infection   vitamin C 500 MG tablet Commonly known as:  ASCORBIC ACID Take 500 mg by mouth daily.   zolpidem 10 MG tablet Commonly known as:  AMBIEN TAKE 1 TABLET AT BEDTIME AS NEEDED.       Review of Systems  Constitutional: Positive for fatigue. Negative for activity change, appetite change, chills, diaphoresis, fever and unexpected weight change.  HENT: Negative for congestion, ear discharge, ear pain, hearing loss, postnasal drip, rhinorrhea, sore throat, tinnitus, trouble swallowing and voice change.   Eyes: Negative for pain, redness, itching and visual disturbance.  Respiratory: Negative for cough, choking, shortness of breath and wheezing.   Cardiovascular: Negative for chest pain, palpitations and leg swelling.  Gastrointestinal: Negative for abdominal distention, abdominal pain, constipation, diarrhea and nausea.  Endocrine: Negative for cold intolerance, heat intolerance, polydipsia, polyphagia and polyuria.  Genitourinary: Positive for frequency. Negative for difficulty urinating, dysuria, flank pain,  hematuria, pelvic pain, urgency and vaginal discharge.  Musculoskeletal: Positive for gait problem (cane). Negative for arthralgias, back pain, myalgias, neck pain and neck stiffness.       Right foot drop  Skin: Negative for color change, pallor and rash.  Allergic/Immunologic: Negative.   Neurological: Negative for dizziness, tremors, seizures, syncope, weakness, numbness and headaches.       Lower body weakness, ambulates with walker  Hematological: Negative for adenopathy. Does not bruise/bleed easily.  Psychiatric/Behavioral: Positive for sleep disturbance. Negative for agitation, behavioral problems, confusion, dysphoric mood, hallucinations and suicidal ideas. The patient is nervous/anxious. The patient is not hyperactive.     Vitals:   10/28/16 1505  BP: (!) 150/84  Pulse: 72  Resp: 20  Temp: 98.3 F (36.8 C)  TempSrc: Oral  SpO2: 96%  Weight: 189 lb (85.7 kg)  Height: 5\' 8"  (1.727 m)   Body mass index is 28.74 kg/m. Physical Exam  Constitutional: She is oriented to person, place, and time. She appears well-developed and well-nourished. No distress.  HENT:  Right Ear: External ear normal.  Left Ear: External ear normal.  Nose: Nose normal.  Mouth/Throat: Oropharynx is clear and moist. No oropharyngeal exudate.  Eyes: Conjunctivae and EOM are normal. Pupils are equal, round, and reactive to light. No scleral icterus.  Neck: No JVD present. No tracheal deviation present. No thyromegaly present.  Cardiovascular: Normal rate, regular rhythm, normal heart sounds and intact distal pulses.  Exam reveals no gallop and no friction rub.   No murmur heard. Pulmonary/Chest: Effort normal. No respiratory distress. She has no wheezes. She has no rales. She exhibits no tenderness.  Abdominal: She exhibits no distension and no mass. There is no tenderness.  Musculoskeletal: Normal range of motion. She exhibits no edema or tenderness.  RIGHT afo FOR FOOT DROP  Lymphadenopathy:    She  has no cervical adenopathy.  Neurological: She is alert and oriented to person, place, and time. No cranial nerve deficit. Coordination normal.  Right foot drop, brace  Skin: No rash noted. She is not diaphoretic. No erythema. No pallor.  Psychiatric: She has a normal mood and affect. Her behavior is normal. Judgment and thought content normal.    Labs reviewed: Basic Metabolic Panel:  Recent Labs  09/12/16 0915 09/13/16 0940 09/14/16 0513  NA 139 137 139  K 3.6 3.5 3.6  CL 104 103 107  CO2 28 27 26   GLUCOSE 125* 129* 116*  BUN 11 11 9   CREATININE 0.63 0.56 0.58  CALCIUM 9.1 9.1 8.4*  MG  --   --  2.1   Liver Function Tests:  Recent Labs  09/12/16 0915 09/13/16 0940 09/14/16 0513  AST 21 23 23   ALT 15 21 22   ALKPHOS 57 62 53  BILITOT 0.9 0.8 0.4  PROT 6.5 6.8 6.2*  ALBUMIN 3.6 3.7 3.1*   No results for input(s): LIPASE, AMYLASE in the last 8760 hours. No results for input(s): AMMONIA in the last 8760 hours. CBC:  Recent Labs  09/12/16 0915 09/13/16 0940 09/15/16 0647  WBC 10.0 11.1* 6.8  NEUTROABS 7.9* 9.1*  --   HGB 14.5 13.9 13.6  HCT 43.4 42.1 41.2  MCV 91.9 93.3 92.0  PLT 160 146* 196   Cardiac Enzymes: No results for input(s): CKTOTAL, CKMB, CKMBINDEX, TROPONINI in the last 8760 hours. BNP: Invalid input(s): POCBNP No results found for: HGBA1C Lab Results  Component Value Date   TSH 1.897 09/13/2016   Lab Results  Component Value Date   [id number] 818 09/13/2016   No results found for: FOLATE No results found for: IRON, TIBC, FERRITIN  Imaging and Procedures obtained prior to SNF admission: Dg Hip Complete Right  Result Date: 04/13/2012 *RADIOLOGY REPORT* Clinical Data: Fall.  Right hip pain. RIGHT HIP - COMPLETE 2+ VIEW Comparison: With Findings: The patient has acute or right superior and inferior pubic rami fractures.  No other fracture is identified.  Left worse than right hip osteoarthritis is seen.  Postoperative change lower lumbar  spine and right pelvis noted. IMPRESSION: 1.  Acute right superior and inferior pubic rami fractures. 2.  Left worse than right hip osteoarthritis. Original Report Authenticated By: Arvid Right. Luther Parody, M.D.    Assessment/Plan Recurrent UTI On Bactrim a week, denied dysuria, fever, but c/o generalized weakness, nausea, vomiting, poor appetite, constipation, or diarrhea. Given hx of uroseptic episode and recurrent nature of UTI, will update CBC CMP UA C/S  Essential hypertension Mildly elevated, denied HA, vision change, chert pain/pressure/palpitation, no focal neurological symptoms. Continue Amlodipine 10mg , Losartan 50mg  qd,  Metoprolol 25mg  bid  Depression Stable. Continue Sertraline 50mg   Memory deficit Memory lapses, continue Aricept  Urge incontinence Continue taking Vesicare and Myrbetriq  Spinal stenosis of lumbar region Lower leg weakness, s/p surgery, failed, ambulates with walker, History of surgery 2012 with residual right foot drop, continue Gabapentin 800mg  tid.   Insomnia Not sleeping well. Continue Ambien 10mg  prn hs.    Family/ staff Communication: IL  Labs/tests ordered: CBC CMP UA C/S

## 2016-10-28 NOTE — Assessment & Plan Note (Addendum)
Mildly elevated, denied HA, vision change, chert pain/pressure/palpitation, no focal neurological symptoms. Continue Amlodipine 10mg , Losartan 50mg  qd,  Metoprolol 25mg  bid

## 2016-10-29 ENCOUNTER — Telehealth: Payer: Self-pay | Admitting: *Deleted

## 2016-10-29 LAB — URINE CULTURE: ORGANISM ID, BACTERIA: NO GROWTH

## 2016-10-29 NOTE — Telephone Encounter (Signed)
-----   Message from Man Otho Darner, NP sent at 10/29/2016  1:09 PM EDT ----- Would you inform the patient that her Labs from 10/28/16 unremarkable except Hgb 16.2, which may indicate hemoconcentration, please instruct the patient to hydrate herself better. Wbc was 9.4 wnl. thx

## 2016-10-29 NOTE — Telephone Encounter (Signed)
Received labs from PCP drawn 09/23/16. CBC/CMP normal

## 2016-10-29 NOTE — Telephone Encounter (Signed)
Spoke with patient regarding her lab results, she stated that she was feeling much better today. She also stated that she had no questions at this time.

## 2016-11-02 NOTE — Addendum Note (Signed)
Addended by: Estill Dooms on: 11/02/2016 03:38 PM   Modules accepted: Level of Service

## 2016-11-08 ENCOUNTER — Telehealth: Payer: Self-pay

## 2016-11-08 NOTE — Telephone Encounter (Signed)
Patient notified and will get the Rx from [facility].

## 2016-11-08 NOTE — Telephone Encounter (Signed)
Patient needs a full leg brace for right leg to replace the 83 year old brace that she currently has, diagnosis is foot drop and nerve damage to right side.  Patient would like this order mailed to her and she will get it to the company that supplies it   Please advise

## 2016-11-08 NOTE — Telephone Encounter (Signed)
Prescription was written and left with nurse at [facility], Jonelle Sidle.

## 2016-11-19 ENCOUNTER — Telehealth: Payer: Self-pay | Admitting: *Deleted

## 2016-11-19 NOTE — Telephone Encounter (Signed)
(  Late note) Spoke with patient regarding urine culture on Monday, she stated that she understood the results and had no question at this time.

## 2016-11-19 NOTE — Telephone Encounter (Signed)
-----   Message from Man Otho Darner, NP sent at 11/05/2016  3:08 PM EDT ----- Would you let Kristie [name] know her urine culture showed no growth. Thank you

## 2016-11-24 ENCOUNTER — Telehealth: Payer: Self-pay | Admitting: *Deleted

## 2016-11-24 NOTE — Telephone Encounter (Signed)
Received fax  (CMN) from [facility] [phone] Fax:[phone] needing physician notes to justify patient's need for brace and form filled out and signed. Placed in folder for Dr. Nyoka Cowden to review and sign.  Item Ordered: S9702 Afo Rig Ant Tib Prefab Tcf due to Foot Drop (M21.371)

## 2016-11-30 ENCOUNTER — Telehealth: Payer: Self-pay

## 2016-11-30 NOTE — Telephone Encounter (Signed)
Per pharmacy patient states she is taking Simvastatin 10 mg every other day. I called patient to verify and patient agreed. She also stated that she do not need a refill at this time.

## 2016-12-03 ENCOUNTER — Telehealth: Payer: Self-pay

## 2016-12-03 MED ORDER — SIMVASTATIN 10 MG PO TABS: 10.0000 mg | ORAL_TABLET | ORAL | 1 refills | Status: AC

## 2016-12-03 NOTE — Telephone Encounter (Signed)
[facility] called to request new rx with correct instructions for Simvastatin.   Per phone note dated 11/30/16, patient is taking simvastatin 10 mg by mouth every other day. Although patient stated she does not need a rx at this time, [facility] would like updated rx and will place on hold, until requested by patient.  Medication list updated, updated rx sent electronically to [facility]

## 2016-12-09 ENCOUNTER — Other Ambulatory Visit: Payer: Self-pay | Admitting: Nurse Practitioner

## 2016-12-09 DIAGNOSIS — F329 Major depressive disorder, single episode, unspecified: Secondary | ICD-10-CM

## 2016-12-09 DIAGNOSIS — F32A Depression, unspecified: Secondary | ICD-10-CM

## 2016-12-13 ENCOUNTER — Telehealth (INDEPENDENT_AMBULATORY_CARE_PROVIDER_SITE_OTHER): Payer: Self-pay | Admitting: Radiology

## 2016-12-13 NOTE — Telephone Encounter (Signed)
FW: visco Right knee joint x 3  ASAP.  Received: 2 weeks ago  [place], [place], RT  Deven Furia, RT      Previous Messages    ----- Message -----  From: Eliezer Lofts, PA-C  Sent: 10/19/2016 11:08 AM  To: Shona Needles, RT  Subject: visco Right knee joint x 3  ASAP.        We were requesting the following application beginning jan 2018.   Right knee joint x 3 to start in Jan. 2018 .   If needed, please restart the application process for euflexxa or orthovisc or hyalgan (which ever the insurance approves) since pt is in severe pain and would benefit from [place] treatment.    VOB request faxed to [employer], for right knee. Will followup

## 2016-12-20 NOTE — Telephone Encounter (Signed)
Please call patient and schedule, see below, thanks.  Ok to buy and bill, right knee Orthovisc.  Patient will have a $50 copay each visit and then insurance will cover the rest.

## 2016-12-22 ENCOUNTER — Telehealth: Payer: Self-pay

## 2016-12-22 NOTE — Telephone Encounter (Signed)
Submitted a prior authorization for voltaren gel via cover my meds. Will up date once we receive a response.   Trystin Terhune, [place], CPhT 4:11 PM

## 2016-12-23 NOTE — Telephone Encounter (Signed)
Received a confirmation from Cover My Meds regarding a prior authorization approval for Diclofenac 1% gel from through 07/25/17.   Reference number:[id number] Phone number:[phone]  Left message for patient to call back.  Hiroki Wint, [place], CPhT  8:26 AM

## 2016-12-23 NOTE — Telephone Encounter (Signed)
Left message for patient to return my call to schedule appts for knee injections.

## 2016-12-27 ENCOUNTER — Other Ambulatory Visit: Payer: Self-pay | Admitting: Internal Medicine

## 2016-12-29 ENCOUNTER — Other Ambulatory Visit: Payer: Self-pay | Admitting: Internal Medicine

## 2016-12-29 ENCOUNTER — Other Ambulatory Visit: Payer: Self-pay | Admitting: Rheumatology

## 2016-12-29 NOTE — Telephone Encounter (Signed)
Last Visit: 10/19/16 Next Visit: 03/16/17  Okay to refill Voltaren Gel?

## 2017-01-09 ENCOUNTER — Other Ambulatory Visit: Payer: Self-pay | Admitting: Internal Medicine

## 2017-01-09 DIAGNOSIS — F32A Depression, unspecified: Secondary | ICD-10-CM

## 2017-01-09 DIAGNOSIS — F329 Major depressive disorder, single episode, unspecified: Secondary | ICD-10-CM

## 2017-01-18 ENCOUNTER — Encounter: Payer: Self-pay | Admitting: Internal Medicine

## 2017-01-18 ENCOUNTER — Non-Acute Institutional Stay: Payer: Medicare Other | Admitting: Internal Medicine

## 2017-01-18 VITALS — BP 144/80 | HR 68 | Temp 97.8°F | Resp 20 | Ht 68.0 in | Wt 185.8 lb

## 2017-01-18 DIAGNOSIS — M21371 Foot drop, right foot: Secondary | ICD-10-CM

## 2017-01-18 DIAGNOSIS — M797 Fibromyalgia: Secondary | ICD-10-CM | POA: Diagnosis not present

## 2017-01-18 DIAGNOSIS — E784 Other hyperlipidemia: Secondary | ICD-10-CM

## 2017-01-18 DIAGNOSIS — G4700 Insomnia, unspecified: Secondary | ICD-10-CM

## 2017-01-18 DIAGNOSIS — N3946 Mixed incontinence: Secondary | ICD-10-CM

## 2017-01-18 DIAGNOSIS — I10 Essential (primary) hypertension: Secondary | ICD-10-CM

## 2017-01-18 DIAGNOSIS — R4189 Other symptoms and signs involving cognitive functions and awareness: Secondary | ICD-10-CM

## 2017-01-18 DIAGNOSIS — E7849 Other hyperlipidemia: Secondary | ICD-10-CM

## 2017-01-18 DIAGNOSIS — M48062 Spinal stenosis, lumbar region with neurogenic claudication: Secondary | ICD-10-CM

## 2017-01-18 DIAGNOSIS — F3342 Major depressive disorder, recurrent, in full remission: Secondary | ICD-10-CM

## 2017-01-18 DIAGNOSIS — E8881 Metabolic syndrome: Secondary | ICD-10-CM

## 2017-01-18 NOTE — Progress Notes (Signed)
Location:  [facility]- clinic  Provider: Blanchie Serve  Code Status: full code   Goals of Care:  Advanced Directives 10/28/2016  Does Patient Have a Medical Advance Directive? Yes  Type of Paramedic of [place];Living will  Does patient want to make changes to medical advance directive? No - Patient declined  Copy of [place] in Chart? Yes  Would patient like information on creating a medical advance directive? -  Pre-existing out of facility DNR order (yellow form or pink MOST form) -     Chief Complaint  Patient presents with  . Medical Management of Chronic Issues    Patient has no specific concerns at this time    HPI: Patient is a 83 y.o. female seen today for medical management of chronic diseases. She denies any concern this visit. Compliant with her medications.   Spinal stenosis of lumbar region No numbness or tingling, weakness present, radiculopathy to right leg, uses a walker. No fall reported. Last fall in feb 2018. On neurontin.   Right foot drop From nerve injury post back surgery. Uses a brace. Uses a walker to get around.   Depression Mood has been stable. Currently on zoloft 50 mg daily.   HTN Checks BP weekly at home with readings <140/90, on amlodipine, losartan, metoprolol. Does not exercise.   Decreased concentration Has concern for memory issues at times. Of note took aricept before and now off it. Family history of dementia- likely alzheimer's  Insomnia Controlled with Lorrin Mais    Past Medical History:  Diagnosis Date  . Cataract   . Fibromyalgia 04/25/2014  . Hyperlipidemia   . Hypertension   . Lesion of sciatic nerve   . Low back pain   . Memory loss   . Spinal stenosis of lumbar region 04/25/2014  . Tremor   . Weakness     Past Surgical History:  Procedure Laterality Date  . ABDOMINAL HYSTERECTOMY  1963  . APPENDECTOMY    . CATARACT EXTRACTION W/ INTRAOCULAR LENS  IMPLANT, BILATERAL  2005  and 2007  . COLON SURGERY  1974   colon polyp  . COLON SURGERY  2002   colon polyps(2)  Dr Collene Mares  . COLONOSCOPY W/ POLYPECTOMY  1974and 2002  . EYE SURGERY  2005/2007   cataract renoval  Dr. Bing Plume  . KNEE SURGERY  2009   Dr. Ronnie Derby  . LUMBAR LAMINECTOMY  2012   L4-5 with spinous process fixation (06/22/11) Dr. Tommi Emery  . schwanoma  1987   residual right foot drop and right leg pain. Dr. Deirdre Pippins  . SMALL INTESTINE SURGERY  1990   bowel obstruction, Dr. Rebekah Chesterfield  . TONSILLECTOMY  1940    Allergies  Allergen Reactions  . Restoril [Temazepam]     Nightmares  . Bactrim [Sulfamethoxazole-Trimethoprim] Nausea And Vomiting    Allergies as of 01/18/2017      Reactions   Restoril [temazepam]    Nightmares   Bactrim [sulfamethoxazole-trimethoprim] Nausea And Vomiting      Medication List       Accurate as of 01/18/17 10:38 AM. Always use your most recent med list.          acetaminophen 500 MG tablet Commonly known as:  TYLENOL Take 500-1,000 mg by mouth at bedtime. Take 2 tablets at night   amLODipine 10 MG tablet Commonly known as:  NORVASC Take 1 tablet (10 mg total) by mouth daily.   aspirin 81 MG chewable tablet Chew 81 mg  by mouth daily.   cholecalciferol 1000 units tablet Commonly known as:  VITAMIN D Take 2,000 Units by mouth daily.   diclofenac sodium 1 % Gel Commonly known as:  VOLTAREN APPLY 4 GRAMS TO 3 LARGE JOINTS UP TO 3 TIMES A DAY AS NEEDED   fluticasone 50 MCG/ACT nasal spray Commonly known as:  FLONASE Place 2 sprays into both nostrils daily. 2 sprays in each nostril once daily   gabapentin 800 MG tablet Commonly known as:  NEURONTIN Take 1 tablet (800 mg total) by mouth 3 (three) times daily.   ibuprofen 200 MG tablet Commonly known as:  ADVIL,MOTRIN Take 200 mg by mouth every morning.   losartan 50 MG tablet Commonly known as:  COZAAR Take 1 tablet (50 mg total) by mouth daily.   methenamine 1 g tablet Commonly known as:   HIPREX Take 1 g by mouth 2 (two) times daily with a meal.   metoprolol tartrate 25 MG tablet Commonly known as:  LOPRESSOR TAKE 1 TABLET TWICE DAILY TO CONTROL BLOOD PRESSURE.   mirabegron ER 25 MG Tb24 tablet Commonly known as:  MYRBETRIQ Take 1 tablet (25 mg total) by mouth daily.   PROBIOTIC ACIDOPHILUS PO Take 1 capsule by mouth daily.   sertraline 50 MG tablet Commonly known as:  ZOLOFT TAKE 1 TABLET EACH DAY.   simvastatin 10 MG tablet Commonly known as:  ZOCOR Take 1 tablet (10 mg total) by mouth every other day.   solifenacin 10 MG tablet Commonly known as:  VESICARE Take 1 tablet (10 mg total) by mouth daily.   zolpidem 10 MG tablet Commonly known as:  AMBIEN TAKE 1 TABLET AT BEDTIME AS NEEDED.       Review of Systems:  Review of Systems  Constitutional: Negative for appetite change, chills and fever.  HENT: Negative for congestion, hearing loss, mouth sores, sore throat and trouble swallowing.   Eyes:       History of cataract surgery to both eyes, follows with eye doctor  Respiratory: Negative for cough and shortness of breath.   Cardiovascular: Negative for chest pain, palpitations and leg swelling.  Gastrointestinal: Negative for abdominal pain, blood in stool, constipation, diarrhea, nausea and vomiting.  Genitourinary: Positive for frequency. Negative for dysuria, flank pain, hematuria, vaginal bleeding and vaginal discharge.       Has urinary continence and followed by urology, has to wake up 2-3 times at night to urinate  Musculoskeletal: Positive for back pain and gait problem.  Skin: Negative for rash and wound.  Neurological: Positive for tremors. Negative for dizziness, seizures, light-headedness, numbness and headaches.       Has noted mild tremors with activities  Hematological: Bruises/bleeds easily.  Psychiatric/Behavioral: Positive for decreased concentration. Negative for behavioral problems and hallucinations. The patient is not  nervous/anxious.     Health Maintenance  Topic Date Due  . TETANUS/TDAP  08/20/1947  . DEXA SCAN  08/21/1995  . PNA vac Low Risk Adult (1 of 2 - PCV13) 08/21/1995  . INFLUENZA VACCINE  02/23/2017    Physical Exam: Vitals:   01/18/17 0913  BP: (!) 144/80  Pulse: 68  Resp: 20  Temp: 97.8 F (36.6 C)  TempSrc: Oral  SpO2: 95%  Weight: 185 lb 12.8 oz (84.3 kg)  Height: 5\' 8"  (1.727 m)   Body mass index is 28.25 kg/m. Physical Exam  Constitutional: She is oriented to person, place, and time. She appears well-developed. No distress.  overweight  HENT:  Head: Normocephalic and  atraumatic.  Mouth/Throat: Oropharynx is clear and moist.  Eyes: Pupils are equal, round, and reactive to light.  Neck: Normal range of motion. Neck supple.  Cardiovascular: Normal rate, regular rhythm and normal heart sounds.   Pulmonary/Chest: Effort normal and breath sounds normal. No respiratory distress. She has no wheezes. She has no rales.  Abdominal: Soft. Bowel sounds are normal. She exhibits no distension. There is no rebound and no guarding.  Musculoskeletal: She exhibits no edema.  Can move all 4 extremities, uses a rollator walker with seat, right leg brace in place  Lymphadenopathy:    She has no cervical adenopathy.  Neurological: She is alert and oriented to person, place, and time.  Mild tremor  Skin: Skin is warm and dry. No rash noted. She is not diaphoretic.  Psychiatric: She has a normal mood and affect. Her behavior is normal.    Labs reviewed: Basic Metabolic Panel:  Recent Labs  09/13/16 0940 09/13/16 1750 09/14/16 0513 10/28/16 1627  NA 137  --  139 136  K 3.5  --  3.6 4.3  CL 103  --  107 100  CO2 27  --  26 29  GLUCOSE 129*  --  116* 113*  BUN 11  --  9 12  CREATININE 0.56  --  0.58 0.69  CALCIUM 9.1  --  8.4* 10.2  MG  --   --  2.1  --   TSH  --  1.897  --   --    Liver Function Tests:  Recent Labs  09/13/16 0940 09/14/16 0513 10/28/16 1627  AST 23  23 17  17   ALT 21 22 11  11   ALKPHOS 62 53 58  58  BILITOT 0.8 0.4 0.4  0.4  PROT 6.8 6.2* 7.0  7.0  ALBUMIN 3.7 3.1* 4.3  4.3   No results for input(s): LIPASE, AMYLASE in the last 8760 hours. No results for input(s): AMMONIA in the last 8760 hours. CBC:  Recent Labs  09/12/16 0915 09/13/16 0940 09/15/16 0647 10/28/16 1627  WBC 10.0 11.1* 6.8 9.4  NEUTROABS 7.9* 9.1*  --  6,110  HGB 14.5 13.9 13.6 16.2*  HCT 43.4 42.1 41.2 47.2*  MCV 91.9 93.3 92.0 92.0  PLT 160 146* 196 285   Lipid Panel: No results for input(s): CHOL, HDL, LDLCALC, TRIG, CHOLHDL, LDLDIRECT in the last 8760 hours. No results found for: HGBA1C  Procedures since last visit: No results found.  Assessment/Plan  Spinal stenosis of lumbar region No numbness or tingling, weakness present, radiculopathy to right leg, uses a walker. No fall reported. Last fall in feb 2018. On neurontin. Continue vitamin d  Right foot drop From nerve injury post back surgery. Uses a brace. Uses a walker to get around.   Depression Mood has been stable. Currently on zoloft 50 mg daily. Does not want dose changed this visit. Monitor clinically and counselled on GDR next visit.   Urinary incontinence With frequent UTI. Continue mirabegron and solifenacin. Continue methenamine to prevent recurrent uti  Hyperlipidemia Check lipid panel, continue zocor for now  HTN Checks BP weekly at home with readings <140/90, on amlodipine, losartan, metoprolol. Encouraged activities. No changes made to med. Continue aspirin  Decreased concentration Has concern for memory issues at times. Of note took aricept before and now off it. Family history of dementia- likely alzheimer's. Will need MMSE next visit.   Insomnia Controlled with ambien  Fibromyalgia Followed by rheumatology, continue tylenol 1000 mg daily at bedtime and  ibuprofen 200 mg in am. Check renal function. Advised to take ibuprofen with food.   Metabolic  syndrome Has hyperlipidemia and HTN. Check a1c to rule out diabetes. Encouraged and counselled to exercise 30 minutes 5 days a week.    Copy of lab work to be sent to  rheumatologist Dr Estanislado Pandy   Labs/tests ordered:  Cbc, cmp, a1c, lipid panel  Next appt:  6 months or earlier if needed  [facility], MD Internal Medicine [place], [place] [address] Cell Phone (Monday-Friday 8 am - 5 pm): [phone] On Call: [phone] and follow prompts after 5 pm and on weekends Office Phone: [phone] Office Fax: [phone]

## 2017-01-20 LAB — COMPLETE METABOLIC PANEL WITH GFR
ALT: 18 U/L (ref 6–29)
AST: 18 U/L (ref 10–35)
Albumin: 4 g/dL (ref 3.6–5.1)
Alkaline Phosphatase: 52 U/L (ref 33–130)
BILIRUBIN TOTAL: 0.6 mg/dL (ref 0.2–1.2)
BUN: 12 mg/dL (ref 7–25)
CHLORIDE: 104 mmol/L (ref 98–110)
CO2: 27 mmol/L (ref 20–31)
Calcium: 9.3 mg/dL (ref 8.6–10.4)
Creat: 0.6 mg/dL (ref 0.60–0.88)
GFR, EST NON AFRICAN AMERICAN: 83 mL/min (ref >=60)
GFR, Est African American: >89 mL/min (ref >=60)
GLUCOSE: 95 mg/dL (ref 65–99)
Potassium: 3.9 mmol/L (ref 3.5–5.3)
SODIUM: 138 mmol/L (ref 135–146)
TOTAL PROTEIN: 6.3 g/dL (ref 6.1–8.1)

## 2017-01-20 LAB — LIPID PANEL
Cholesterol: 227 mg/dL — ABNORMAL HIGH (ref <200)
HDL: 56 mg/dL (ref >50)
LDL CALC: 132 mg/dL — AB (ref <100)
Total CHOL/HDL Ratio: 4.1 Ratio (ref <5.0)
Triglycerides: 194 mg/dL — ABNORMAL HIGH (ref <150)
VLDL: 39 mg/dL — AB (ref <30)

## 2017-01-20 LAB — CBC WITH DIFFERENTIAL/PLATELET
BASOS ABS: 0 {cells}/uL (ref 0–200)
Basophils Relative: 0 %
EOS PCT: 1 %
Eosinophils Absolute: 57 cells/uL (ref 15–500)
HCT: 44.4 % (ref 35.0–45.0)
HEMOGLOBIN: 15.4 g/dL (ref 11.7–15.5)
LYMPHS ABS: 2394 {cells}/uL (ref 850–3900)
LYMPHS PCT: 42 %
MCH: 31.5 pg (ref 27.0–33.0)
MCHC: 34.7 g/dL (ref 32.0–36.0)
MCV: 90.8 fL (ref 80.0–100.0)
MONOS PCT: 10 %
MPV: 10.2 fL (ref 7.5–12.5)
Monocytes Absolute: 570 cells/uL (ref 200–950)
Neutro Abs: 2679 cells/uL (ref 1500–7800)
Neutrophils Relative %: 47 %
PLATELETS: 223 10*3/uL (ref 140–400)
RBC: 4.89 MIL/uL (ref 3.80–5.10)
RDW: 13.4 % (ref 11.0–15.0)
WBC: 5.7 10*3/uL (ref 3.8–10.8)

## 2017-01-21 LAB — HEMOGLOBIN A1C
HEMOGLOBIN A1C: 5 % (ref <5.7)
MEAN PLASMA GLUCOSE: 97 mg/dL

## 2017-01-27 ENCOUNTER — Other Ambulatory Visit: Payer: Self-pay | Admitting: Nurse Practitioner

## 2017-01-31 ENCOUNTER — Other Ambulatory Visit: Payer: Self-pay | Admitting: Nurse Practitioner

## 2017-02-04 ENCOUNTER — Other Ambulatory Visit: Payer: Self-pay | Admitting: Nurse Practitioner

## 2017-02-09 ENCOUNTER — Other Ambulatory Visit: Payer: Self-pay | Admitting: *Deleted

## 2017-02-09 MED ORDER — SOLIFENACIN SUCCINATE 10 MG PO TABS: 10.0000 mg | ORAL_TABLET | Freq: Every day | ORAL | 0 refills | Status: AC

## 2017-02-09 NOTE — Telephone Encounter (Signed)
Patient stated that she has lost her bottle and needs a refill faxed to [facility]. Rx faxed.

## 2017-02-14 ENCOUNTER — Other Ambulatory Visit: Payer: Self-pay | Admitting: Internal Medicine

## 2017-02-14 DIAGNOSIS — F32A Depression, unspecified: Secondary | ICD-10-CM

## 2017-02-14 DIAGNOSIS — F329 Major depressive disorder, single episode, unspecified: Secondary | ICD-10-CM

## 2017-03-01 ENCOUNTER — Non-Acute Institutional Stay: Payer: Medicare Other | Admitting: Internal Medicine

## 2017-03-01 ENCOUNTER — Encounter: Payer: Self-pay | Admitting: Internal Medicine

## 2017-03-01 VITALS — BP 126/74 | HR 65 | Temp 98.8°F | Resp 18 | Ht 68.0 in

## 2017-03-01 DIAGNOSIS — R350 Frequency of micturition: Secondary | ICD-10-CM

## 2017-03-01 DIAGNOSIS — F5101 Primary insomnia: Secondary | ICD-10-CM | POA: Diagnosis not present

## 2017-03-01 MED ORDER — ZOLPIDEM TARTRATE 10 MG PO TABS: 5.0000 mg | ORAL_TABLET | Freq: Every day | ORAL | 0 refills | Status: DC

## 2017-03-01 MED ORDER — ESZOPICLONE 1 MG PO TABS: 1.0000 mg | ORAL_TABLET | Freq: Every day | ORAL | 3 refills | Status: DC

## 2017-03-01 NOTE — Progress Notes (Signed)
[facility]  Provider: Blanchie Serve MD   Location:  [place] of Service:  Clinic (12)  PCP: Blanchie Serve, MD Patient Care Team: Blanchie Serve, MD as PCP - General (Internal Medicine) Bo Merino, MD as Consulting Physician (Rheumatology) Juanita Craver, MD as Consulting Physician (Gastroenterology) Vickey Huger, MD as Consulting Physician (Orthopedic Surgery) Calvert Cantor, MD as Consulting Physician (Ophthalmology) Mast, Man X, NP as Nurse Practitioner (Internal Medicine)  Extended Emergency Contact Information Primary Emergency Contact: Gruendler,Joseph Address: [address], [place] Phone: [phone] Mobile Phone: [phone] Relation: Spouse  Goals of Care: Advanced Directive information Advanced Directives 10/28/2016  Does Patient Have a Medical Advance Directive? Yes  Type of Paramedic of [place];Living will  Does patient want to make changes to medical advance directive? No - Patient declined  Copy of [place] in Chart? Yes  Would patient like information on creating a medical advance directive? -  Pre-existing out of facility DNR order (yellow form or pink MOST form) -      Chief Complaint  Patient presents with  . Acute Visit    UTI. Patient did a home test this morning and has the results with her. Patient stated that she hasn't been sleeping and feels tired all the tiime. Patient stated that the ambien isn't help her sleep she wakes up at 3 am.  . Medication Refill    No refill needed at this time    HPI: Patient is a 83 y.o. female seen today for acute visit.   Urinary frequency- She has been having increased urinary frequency x 4 days. She has urgency. Denies any burning or pain with urination. Denies hematuria. Appetite is fair. Has leakage of urine at times and uses pads that helps.   Insomnia-  currently on prn ambien. Goes to bed between 10-11 pm and falls asleep in 1 hr. She watches TV and listens to radio while in bed. Her sleep is interrupted and after she wakes at 3 am, unable to go back to sleep. Denies nocturia worsening except recent urinary complaints. She has been taking [place] daily. Did not tolerate trazodone in past with night mares. Melatonin has not worked in past.   Depression- tried GDR to 25 mg of zoloft but did not tolerate it and is back on 50 mg daily. Does not want any changes. Mood has been stable   Past Medical History:  Diagnosis Date  . Cataract   . Fibromyalgia 04/25/2014  . Hyperlipidemia   . Hypertension   . Lesion of sciatic nerve   . Low back pain   . Memory loss   . Spinal stenosis of lumbar region 04/25/2014  . Tremor   . Weakness    Past Surgical History:  Procedure Laterality Date  . ABDOMINAL HYSTERECTOMY  1963  . APPENDECTOMY    . CATARACT EXTRACTION W/ INTRAOCULAR LENS  IMPLANT, BILATERAL  2005 and 2007  . COLON SURGERY  1974   colon polyp  . COLON SURGERY  2002   colon polyps(2)  Dr Collene Mares  . COLONOSCOPY W/ POLYPECTOMY  1974and 2002  . EYE SURGERY  2005/2007   cataract renoval  Dr. Bing Plume  . KNEE SURGERY  2009   Dr. Ronnie Derby  . LUMBAR LAMINECTOMY  2012   L4-5 with spinous process fixation (06/22/11) Dr. Tommi Emery  . schwanoma  1987  residual right foot drop and right leg pain. Dr. Deirdre Pippins  . SMALL INTESTINE SURGERY  1990   bowel obstruction, Dr. Rebekah Chesterfield  . TONSILLECTOMY  1940    reports that she quit smoking about 42 years ago. She has never used smokeless tobacco. She reports that she drinks about 1.2 oz of alcohol per week . She reports that she does not use drugs. Social History   Social History  . Marital status: Married    Spouse name: N/A  . Number of children: 4  . Years of education: N/A   Occupational History  . retired Public relations account executive  Retired    retired   Social History Main Topics  . Smoking status: Former Smoker     Quit date: 04/25/1974  . Smokeless tobacco: Never Used     Comment: Quit thirty years ago.  . Alcohol use 1.2 oz/week    2 Glasses of wine per week     Comment: 2 glasses of wine daily  . Drug use: No  . Sexual activity: Not on file   Other Topics Concern  . Not on file   Social History Narrative   Patient is retired Public relations account executive and lives with her husband, (married since 1980) at [place] since 2013   Patient  is right handed. She drinks 1 cup of coffee per day.    She does not have any pets.   Former smoker for 20 years   Exercise none                           Family History  Problem Relation Age of Onset  . Breast cancer Mother   . Heart disease Mother   . Dementia Father   . Cancer Sister        Esophageal     Health Maintenance  Topic Date Due  . INFLUENZA VACCINE  02/23/2017  . DEXA SCAN  07/26/2017 (Originally 08/21/1995)  . TETANUS/TDAP  07/26/2017 (Originally 08/20/1947)  . PNA vac Low Risk Adult (1 of 2 - PCV13) 07/26/2017 (Originally 08/21/1995)    Allergies  Allergen Reactions  . Restoril [Temazepam]     Nightmares  . Bactrim [Sulfamethoxazole-Trimethoprim] Nausea And Vomiting    Outpatient Encounter Prescriptions as of 03/01/2017  Medication Sig  . acetaminophen (TYLENOL) 500 MG tablet Take 1,000 mg by mouth at bedtime.   Marland Kitchen amLODipine (NORVASC) 10 MG tablet Take 1 tablet (10 mg total) by mouth daily.  Marland Kitchen aspirin 81 MG chewable tablet Chew 81 mg by mouth daily.  . cholecalciferol (VITAMIN D) 1000 UNITS tablet Take 2,000 Units by mouth daily.   . diclofenac sodium (VOLTAREN) 1 % GEL APPLY 4 GRAMS TO 3 LARGE JOINTS UP TO 3 TIMES A DAY AS NEEDED  . fluticasone (FLONASE) 50 MCG/ACT nasal spray Place 2 sprays into both nostrils daily. 2 sprays in each nostril once daily  . gabapentin (NEURONTIN) 800 MG tablet TAKE 1 TABLET 3 TIMES A DAY.  Marland Kitchen ibuprofen (ADVIL,MOTRIN) 200 MG tablet Take 200 mg by mouth every morning.  . Lactobacillus (PROBIOTIC  ACIDOPHILUS PO) Take 1 capsule by mouth daily.  Marland Kitchen losartan (COZAAR) 50 MG tablet Take 1 tablet (50 mg total) by mouth daily.  . methenamine (HIPREX) 1 g tablet Take 1 g by mouth 2 (two) times daily with a meal.  . metoprolol tartrate (LOPRESSOR) 25 MG tablet TAKE 1 TABLET TWICE DAILY TO CONTROL BLOOD PRESSURE.  . mirabegron ER (MYRBETRIQ) 25 MG TB24  tablet Take 1 tablet (25 mg total) by mouth daily.  . sertraline (ZOLOFT) 50 MG tablet TAKE 1 TABLET EACH DAY.  . simvastatin (ZOCOR) 10 MG tablet Take 1 tablet (10 mg total) by mouth every other day.  . solifenacin (VESICARE) 10 MG tablet Take 1 tablet (10 mg total) by mouth daily.  Marland Kitchen zolpidem (AMBIEN) 10 MG tablet TAKE 1 TABLET AT BEDTIME AS NEEDED.   No facility-administered encounter medications on file as of 03/01/2017.     Review of Systems  Constitutional: Negative for appetite change, chills and fever.  Gastrointestinal: Negative for abdominal pain, constipation, diarrhea, nausea and vomiting.  Genitourinary: Positive for frequency and urgency. Negative for decreased urine volume, dysuria, flank pain, hematuria and pelvic pain.  Psychiatric/Behavioral: Positive for sleep disturbance. Negative for behavioral problems, confusion, dysphoric mood, hallucinations, self-injury and suicidal ideas. The patient is not nervous/anxious.     Vitals:   03/01/17 1113  BP: 126/74  Pulse: 65  Resp: 18  Temp: 98.8 F (37.1 C)  TempSrc: Oral  SpO2: 95%  Height: 5\' 8"  (1.727 m)   There is no height or weight on file to calculate BMI. Physical Exam  Constitutional: She is oriented to person, place, and time. She appears well-developed and well-nourished. No distress.  HENT:  Head: Normocephalic and atraumatic.  Eyes: Pupils are equal, round, and reactive to light. Conjunctivae and EOM are normal.  Neck: Neck supple.  Cardiovascular: Normal rate and regular rhythm.   Pulmonary/Chest: Effort normal. She has no rales.  Abdominal: Soft. Bowel sounds  are normal. She exhibits no distension. There is no tenderness. There is no rebound and no guarding.  No flank pain, no suprapubic tenderness  Musculoskeletal:  Uses walker, unsteady gait, edema +  Lymphadenopathy:    She has no cervical adenopathy.  Neurological: She is alert and oriented to person, place, and time.  Skin: Skin is warm and dry. She is not diaphoretic.  Psychiatric: Her behavior is normal.    Labs reviewed: Basic Metabolic Panel:  Recent Labs  09/14/16 0513 10/28/16 1627 01/19/17 1213  NA 139 136 138  K 3.6 4.3 3.9  CL 107 100 104  CO2 26 29 27   GLUCOSE 116* 113* 95  BUN 9 12 12   CREATININE 0.58 0.69 0.60  CALCIUM 8.4* 10.2 9.3  MG 2.1  --   --    Liver Function Tests:  Recent Labs  09/14/16 0513 10/28/16 1627 01/19/17 1213  AST 23 17  17 18   ALT 22 11  11 18   ALKPHOS 53 58  58 52  BILITOT 0.4 0.4  0.4 0.6  PROT 6.2* 7.0  7.0 6.3  ALBUMIN 3.1* 4.3  4.3 4.0   No results for input(s): LIPASE, AMYLASE in the last 8760 hours. No results for input(s): AMMONIA in the last 8760 hours. CBC:  Recent Labs  09/13/16 0940 09/15/16 0647 10/28/16 1627 01/19/17 1213  WBC 11.1* 6.8 9.4 5.7  NEUTROABS 9.1*  --  6,110 2,679  HGB 13.9 13.6 16.2* 15.4  HCT 42.1 41.2 47.2* 44.4  MCV 93.3 92.0 92.0 90.8  PLT 146* 196 285 223   Cardiac Enzymes: No results for input(s): CKTOTAL, CKMB, CKMBINDEX, TROPONINI in the last 8760 hours. BNP: Invalid input(s): POCBNP Lab Results  Component Value Date   HGBA1C 5.0 01/19/2017   Lab Results  Component Value Date   TSH 1.897 09/13/2016   Lab Results  Component Value Date   VITAMINB12 478 09/13/2016   No results found for:  FOLATE No results found for: IRON, TIBC, FERRITIN  Lipid Panel:  Recent Labs  01/19/17 1213  CHOL 227*  HDL 56  LDLCALC 132*  TRIG 194*  CHOLHDL 4.1   Lab Results  Component Value Date   HGBA1C 5.0 01/19/2017    Procedures since last visit: No results  found.  Assessment/Plan  1. Urinary frequency Will rule out UTI, hold off on antibiotic for now. Perineal hygiene to be maintained with hydration.  - Culture, Urine - Urinalysis with Reflex Microscopic; Future  2. Primary insomnia Change ambien to 5 mg daily x 1 week and stop. Start lunesta 1 mg po daily at bedtime and monitor. Reassess in 4 weeks.     Labs/tests ordered:  U/a, urine culture.   Next appointment: 4 weeks.   Communication: reviewed care plan with patient   I spent 30 minutes in total face-to-face time with the patient, more than 50% of which was spent in counseling and coordination of care, reviewing test results, reviewing medication and discussing or reviewing the diagnosis with patient.       Blanchie Serve, MD Internal Medicine [facility] [address] [place], [place] [address] Cell Phone (Monday-Friday 8 am - 5 pm): [phone] On Call: [phone] and follow prompts after 5 pm and on weekends Office Phone: [phone] Office Fax: [phone]

## 2017-03-04 ENCOUNTER — Other Ambulatory Visit: Payer: Self-pay | Admitting: Internal Medicine

## 2017-03-04 ENCOUNTER — Telehealth: Payer: Self-pay

## 2017-03-04 DIAGNOSIS — N3000 Acute cystitis without hematuria: Secondary | ICD-10-CM

## 2017-03-04 MED ORDER — SACCHAROMYCES BOULARDII 250 MG PO CAPS: 250.0000 mg | ORAL_CAPSULE | Freq: Two times a day (BID) | ORAL | 0 refills | Status: DC

## 2017-03-04 MED ORDER — NITROFURANTOIN MONOHYD MACRO 100 MG PO CAPS: 100.0000 mg | ORAL_CAPSULE | Freq: Two times a day (BID) | ORAL | 0 refills | Status: DC

## 2017-03-04 NOTE — Progress Notes (Signed)
   Procedure Note  Patient: [name]             Date of Birth: 04/04/1929           MRN: [id number]             Visit Date: 03/14/2017  Procedures: Visit Diagnoses: Unilateral primary osteoarthritis, right knee Orthovisc #2 right knee  Large Joint Inj Date/Time: 03/14/2017 12:10 PM Performed by: Bo Merino Authorized by: Bo Merino   Consent Given by:  Patient Site marked: the procedure site was marked   Timeout: prior to procedure the correct patient, procedure, and site was verified   Indications:  Pain and joint swelling Location:  Knee Site:  R knee Prep: patient was prepped and draped in usual sterile fashion   Needle Size:  27 G Needle Length:  1.5 inches Approach:  Medial Ultrasound Guidance: No   Fluoroscopic Guidance: No   Arthrogram: No   Medications:  1.5 mL lidocaine 1 %; 30 mg Hyaluronan 30 MG/2ML Aspiration Attempted: Yes   Aspirate amount (mL):  0 Patient tolerance:  Patient tolerated the procedure well with no immediate complications   Bo Merino, MD

## 2017-03-04 NOTE — Telephone Encounter (Signed)
Spoke with the patient about her results and medications that Dr. Bubba Camp wants her to take. She verbalized understanding.

## 2017-03-07 ENCOUNTER — Ambulatory Visit: Payer: Medicare Other | Admitting: Rheumatology

## 2017-03-07 ENCOUNTER — Ambulatory Visit (INDEPENDENT_AMBULATORY_CARE_PROVIDER_SITE_OTHER): Payer: Medicare Other | Admitting: Rheumatology

## 2017-03-07 DIAGNOSIS — M1711 Unilateral primary osteoarthritis, right knee: Secondary | ICD-10-CM | POA: Diagnosis not present

## 2017-03-07 MED ORDER — HYALURONAN 30 MG/2ML IX SOSY
30.0000 mg | PREFILLED_SYRINGE | INTRA_ARTICULAR | Status: AC | PRN
  Administered 2017-03-07: 30 mg via INTRA_ARTICULAR

## 2017-03-07 MED ORDER — LIDOCAINE HCL 1 % IJ SOLN
1.5000 mL | INTRAMUSCULAR | Status: AC | PRN
  Administered 2017-03-07: 1.5 mL

## 2017-03-07 NOTE — Progress Notes (Signed)
   Procedure Note  Patient: Kristie [name]             Date of Birth: May 07, 1929           MRN: [id number]             Visit Date: 03/07/2017  Procedures: Visit Diagnoses: Pain right knee. Orthovisc #1right knee B/B Large Joint Inj Date/Time: 03/07/2017 1:07 PM Performed by: Bo Merino Authorized by: Bo Merino   Consent Given by:  Patient Site marked: the procedure site was marked   Timeout: prior to procedure the correct patient, procedure, and site was verified   Indications:  Pain and joint swelling Location:  Knee Site:  R knee Prep: patient was prepped and draped in usual sterile fashion   Needle Size:  25 G Needle Length:  1.5 inches Approach:  Medial Ultrasound Guidance: No   Fluoroscopic Guidance: No   Arthrogram: No   Medications:  1.5 mL lidocaine 1 %; 30 mg Hyaluronan 30 MG/2ML Aspiration Attempted: Yes   Aspirate amount (mL):  0 Patient tolerance:  Patient tolerated the procedure well with no immediate complications    Bo Merino, MD

## 2017-03-08 ENCOUNTER — Other Ambulatory Visit: Payer: Self-pay | Admitting: Internal Medicine

## 2017-03-08 DIAGNOSIS — F32A Depression, unspecified: Secondary | ICD-10-CM

## 2017-03-08 DIAGNOSIS — F329 Major depressive disorder, single episode, unspecified: Secondary | ICD-10-CM

## 2017-03-09 ENCOUNTER — Encounter (HOSPITAL_COMMUNITY): Payer: Self-pay | Admitting: *Deleted

## 2017-03-09 ENCOUNTER — Inpatient Hospital Stay (HOSPITAL_COMMUNITY)
Admission: EM | Admit: 2017-03-09 | Discharge: 2017-03-12 | DRG: 194 | Disposition: A | Discharge status: Home Health | Payer: Medicare Other | Attending: Internal Medicine | Admitting: Internal Medicine

## 2017-03-09 ENCOUNTER — Emergency Department (HOSPITAL_COMMUNITY): Payer: Medicare Other

## 2017-03-09 DIAGNOSIS — E785 Hyperlipidemia, unspecified: Secondary | ICD-10-CM | POA: Diagnosis present

## 2017-03-09 DIAGNOSIS — I2699 Other pulmonary embolism without acute cor pulmonale: Secondary | ICD-10-CM

## 2017-03-09 DIAGNOSIS — J189 Pneumonia, unspecified organism: Secondary | ICD-10-CM | POA: Diagnosis not present

## 2017-03-09 DIAGNOSIS — W19XXXA Unspecified fall, initial encounter: Secondary | ICD-10-CM | POA: Diagnosis present

## 2017-03-09 DIAGNOSIS — I10 Essential (primary) hypertension: Secondary | ICD-10-CM | POA: Diagnosis not present

## 2017-03-09 DIAGNOSIS — Z881 Allergy status to other antibiotic agents status: Secondary | ICD-10-CM

## 2017-03-09 DIAGNOSIS — E876 Hypokalemia: Secondary | ICD-10-CM | POA: Diagnosis present

## 2017-03-09 DIAGNOSIS — F329 Major depressive disorder, single episode, unspecified: Secondary | ICD-10-CM | POA: Diagnosis present

## 2017-03-09 DIAGNOSIS — E860 Dehydration: Secondary | ICD-10-CM | POA: Diagnosis present

## 2017-03-09 DIAGNOSIS — Z87891 Personal history of nicotine dependence: Secondary | ICD-10-CM

## 2017-03-09 DIAGNOSIS — M797 Fibromyalgia: Secondary | ICD-10-CM | POA: Diagnosis not present

## 2017-03-09 DIAGNOSIS — Z803 Family history of malignant neoplasm of breast: Secondary | ICD-10-CM

## 2017-03-09 DIAGNOSIS — Z87440 Personal history of urinary (tract) infections: Secondary | ICD-10-CM

## 2017-03-09 DIAGNOSIS — Z7982 Long term (current) use of aspirin: Secondary | ICD-10-CM

## 2017-03-09 DIAGNOSIS — N6320 Unspecified lump in the left breast, unspecified quadrant: Secondary | ICD-10-CM

## 2017-03-09 DIAGNOSIS — I071 Rheumatic tricuspid insufficiency: Secondary | ICD-10-CM | POA: Diagnosis present

## 2017-03-09 DIAGNOSIS — M5430 Sciatica, unspecified side: Secondary | ICD-10-CM | POA: Diagnosis present

## 2017-03-09 DIAGNOSIS — N390 Urinary tract infection, site not specified: Secondary | ICD-10-CM | POA: Diagnosis present

## 2017-03-09 DIAGNOSIS — R531 Weakness: Secondary | ICD-10-CM

## 2017-03-09 DIAGNOSIS — F0390 Unspecified dementia without behavioral disturbance: Secondary | ICD-10-CM | POA: Diagnosis present

## 2017-03-09 DIAGNOSIS — G4700 Insomnia, unspecified: Secondary | ICD-10-CM | POA: Diagnosis present

## 2017-03-09 DIAGNOSIS — Z888 Allergy status to other drugs, medicaments and biological substances status: Secondary | ICD-10-CM

## 2017-03-09 DIAGNOSIS — R911 Solitary pulmonary nodule: Secondary | ICD-10-CM | POA: Diagnosis present

## 2017-03-09 DIAGNOSIS — G8929 Other chronic pain: Secondary | ICD-10-CM | POA: Diagnosis present

## 2017-03-09 DIAGNOSIS — Z79899 Other long term (current) drug therapy: Secondary | ICD-10-CM

## 2017-03-09 DIAGNOSIS — N289 Disorder of kidney and ureter, unspecified: Secondary | ICD-10-CM

## 2017-03-09 DIAGNOSIS — Y92009 Unspecified place in unspecified non-institutional (private) residence as the place of occurrence of the external cause: Secondary | ICD-10-CM

## 2017-03-09 LAB — CBC WITH DIFFERENTIAL/PLATELET
Basophils Absolute: 0 10*3/uL (ref 0.0–0.1)
Basophils Relative: 0 %
Eosinophils Absolute: 0.2 10*3/uL (ref 0.0–0.7)
Eosinophils Relative: 2 %
HCT: 40 % (ref 36.0–46.0)
Hemoglobin: 12.8 g/dL (ref 12.0–15.0)
Lymphocytes Relative: 8 %
Lymphs Abs: 0.8 10*3/uL (ref 0.7–4.0)
MCH: 30.4 pg (ref 26.0–34.0)
MCHC: 32 g/dL (ref 30.0–36.0)
MCV: 95 fL (ref 78.0–100.0)
Monocytes Absolute: 0.8 10*3/uL (ref 0.1–1.0)
Monocytes Relative: 8 %
Neutro Abs: 8.2 10*3/uL — ABNORMAL HIGH (ref 1.7–7.7)
Neutrophils Relative %: 82 %
Platelets: 184 10*3/uL (ref 150–400)
RBC: 4.21 MIL/uL (ref 3.87–5.11)
RDW: 14.3 % (ref 11.5–15.5)
WBC: 10 10*3/uL (ref 4.0–10.5)

## 2017-03-09 LAB — I-STAT CG4 LACTIC ACID, ED: Lactic Acid, Venous: 1.49 mmol/L (ref 0.5–1.9)

## 2017-03-09 LAB — BASIC METABOLIC PANEL
Anion gap: 10 (ref 5–15)
BUN: 12 mg/dL (ref 6–20)
CO2: 24 mmol/L (ref 22–32)
Calcium: 8.6 mg/dL — ABNORMAL LOW (ref 8.9–10.3)
Chloride: 107 mmol/L (ref 101–111)
Creatinine, Ser: 0.73 mg/dL (ref 0.44–1.00)
GFR calc Af Amer: >60 mL/min (ref >60)
GFR calc non Af Amer: >60 mL/min (ref >60)
Glucose, Bld: 127 mg/dL — ABNORMAL HIGH (ref 65–99)
Potassium: 3.9 mmol/L (ref 3.5–5.1)
Sodium: 141 mmol/L (ref 135–145)

## 2017-03-09 LAB — PHOSPHORUS: Phosphorus: 3 mg/dL (ref 2.5–4.6)

## 2017-03-09 LAB — URINALYSIS, ROUTINE W REFLEX MICROSCOPIC
Bilirubin Urine: NEGATIVE
Glucose, UA: NEGATIVE mg/dL
Ketones, ur: NEGATIVE mg/dL
Leukocytes, UA: NEGATIVE
Nitrite: NEGATIVE
Protein, ur: NEGATIVE mg/dL
Specific Gravity, Urine: 1.01 (ref 1.005–1.030)
pH: 6 (ref 5.0–8.0)

## 2017-03-09 LAB — URINALYSIS, MICROSCOPIC (REFLEX)

## 2017-03-09 LAB — PROCALCITONIN: Procalcitonin: 0.1 ng/mL

## 2017-03-09 LAB — MAGNESIUM: Magnesium: 2.2 mg/dL (ref 1.7–2.4)

## 2017-03-09 MED ORDER — ACETAMINOPHEN 325 MG PO TABS: 650.0000 mg | ORAL_TABLET | Freq: Four times a day (QID) | ORAL | Status: DC | PRN

## 2017-03-09 MED ORDER — ACETAMINOPHEN 650 MG RE SUPP: 650.0000 mg | Freq: Four times a day (QID) | RECTAL | Status: DC | PRN

## 2017-03-09 MED ORDER — NITROFURANTOIN MONOHYD MACRO 100 MG PO CAPS
100.0000 mg | ORAL_CAPSULE | Freq: Two times a day (BID) | ORAL | Status: DC
  Filled 2017-03-09 (×2): qty 1

## 2017-03-09 MED ORDER — ASPIRIN 81 MG PO CHEW
81.0000 mg | CHEWABLE_TABLET | Freq: Every day | ORAL | Status: DC
  Administered 2017-03-10 – 2017-03-12 (×3): 81 mg via ORAL
  Filled 2017-03-09 (×3): qty 1

## 2017-03-09 MED ORDER — DEXTROSE 5 % IV SOLN
500.0000 mg | INTRAVENOUS | Status: DC
  Administered 2017-03-10 – 2017-03-11 (×2): 500 mg via INTRAVENOUS
  Filled 2017-03-09 (×3): qty 500

## 2017-03-09 MED ORDER — ONDANSETRON HCL 4 MG PO TABS: 4.0000 mg | ORAL_TABLET | Freq: Four times a day (QID) | ORAL | Status: DC | PRN

## 2017-03-09 MED ORDER — DEXTROSE 5 % IV SOLN
500.0000 mg | Freq: Once | INTRAVENOUS | Status: AC
  Administered 2017-03-09: 500 mg via INTRAVENOUS
  Filled 2017-03-09: qty 500

## 2017-03-09 MED ORDER — GABAPENTIN 800 MG PO TABS
800.0000 mg | ORAL_TABLET | Freq: Three times a day (TID) | ORAL | Status: DC
  Filled 2017-03-09: qty 1

## 2017-03-09 MED ORDER — DEXTROSE 5 % IV SOLN
1.0000 g | Freq: Once | INTRAVENOUS | Status: AC
  Administered 2017-03-09: 1 g via INTRAVENOUS
  Filled 2017-03-09: qty 10

## 2017-03-09 MED ORDER — DONEPEZIL HCL 10 MG PO TABS
10.0000 mg | ORAL_TABLET | Freq: Two times a day (BID) | ORAL | Status: DC
  Administered 2017-03-09 – 2017-03-12 (×6): 10 mg via ORAL
  Filled 2017-03-09 (×6): qty 1

## 2017-03-09 MED ORDER — ZOLPIDEM TARTRATE 5 MG PO TABS
5.0000 mg | ORAL_TABLET | Freq: Every day | ORAL | Status: DC
  Administered 2017-03-09 – 2017-03-11 (×3): 5 mg via ORAL
  Filled 2017-03-09 (×3): qty 1

## 2017-03-09 MED ORDER — SERTRALINE HCL 50 MG PO TABS
50.0000 mg | ORAL_TABLET | Freq: Every day | ORAL | Status: DC
  Administered 2017-03-10 – 2017-03-12 (×3): 50 mg via ORAL
  Filled 2017-03-09 (×3): qty 1

## 2017-03-09 MED ORDER — SODIUM CHLORIDE 0.9 % IV SOLN
INTRAVENOUS | Status: DC
  Administered 2017-03-09 – 2017-03-11 (×4): via INTRAVENOUS

## 2017-03-09 MED ORDER — DICLOFENAC SODIUM 1 % TD GEL
4.0000 g | Freq: Four times a day (QID) | TRANSDERMAL | Status: DC | PRN
  Administered 2017-03-10: 10:00:00 4 g via TOPICAL
  Filled 2017-03-09: qty 100

## 2017-03-09 MED ORDER — GABAPENTIN 400 MG PO CAPS
800.0000 mg | ORAL_CAPSULE | Freq: Three times a day (TID) | ORAL | Status: DC
  Administered 2017-03-09 – 2017-03-12 (×9): 800 mg via ORAL
  Filled 2017-03-09 (×9): qty 2

## 2017-03-09 MED ORDER — SODIUM CHLORIDE 0.9 % IV BOLUS (SEPSIS)
1000.0000 mL | Freq: Once | INTRAVENOUS | Status: AC
  Administered 2017-03-09: 1000 mL via INTRAVENOUS

## 2017-03-09 MED ORDER — DEXTROSE 5 % IV SOLN
1.0000 g | INTRAVENOUS | Status: DC
  Administered 2017-03-10 – 2017-03-11 (×2): 1 g via INTRAVENOUS
  Filled 2017-03-09 (×3): qty 10

## 2017-03-09 MED ORDER — SIMVASTATIN 10 MG PO TABS
10.0000 mg | ORAL_TABLET | ORAL | Status: DC
  Administered 2017-03-10 – 2017-03-12 (×2): 10 mg via ORAL
  Filled 2017-03-09 (×2): qty 1

## 2017-03-09 MED ORDER — HEPARIN SODIUM (PORCINE) 5000 UNIT/ML IJ SOLN
5000.0000 [IU] | Freq: Three times a day (TID) | INTRAMUSCULAR | Status: DC
  Administered 2017-03-09 – 2017-03-10 (×3): 5000 [IU] via SUBCUTANEOUS
  Filled 2017-03-09 (×3): qty 1

## 2017-03-09 MED ORDER — ONDANSETRON HCL 4 MG/2ML IJ SOLN: 4.0000 mg | Freq: Four times a day (QID) | INTRAMUSCULAR | Status: DC | PRN

## 2017-03-09 MED ORDER — LOSARTAN POTASSIUM 50 MG PO TABS
50.0000 mg | ORAL_TABLET | Freq: Every day | ORAL | Status: DC
  Administered 2017-03-10 – 2017-03-12 (×3): 50 mg via ORAL
  Filled 2017-03-09 (×3): qty 1

## 2017-03-09 MED ORDER — METOPROLOL TARTRATE 25 MG PO TABS
25.0000 mg | ORAL_TABLET | Freq: Two times a day (BID) | ORAL | Status: DC
  Administered 2017-03-09 – 2017-03-12 (×6): 25 mg via ORAL
  Filled 2017-03-09 (×6): qty 1

## 2017-03-09 MED ORDER — DARIFENACIN HYDROBROMIDE ER 15 MG PO TB24
15.0000 mg | ORAL_TABLET | Freq: Every day | ORAL | Status: DC
  Administered 2017-03-10 – 2017-03-12 (×3): 15 mg via ORAL
  Filled 2017-03-09 (×3): qty 1

## 2017-03-09 MED ORDER — AMLODIPINE BESYLATE 5 MG PO TABS
10.0000 mg | ORAL_TABLET | Freq: Every day | ORAL | Status: DC
  Administered 2017-03-09 – 2017-03-12 (×4): 10 mg via ORAL
  Filled 2017-03-09 (×4): qty 2

## 2017-03-09 NOTE — ED Notes (Signed)
Attempted report x1. 

## 2017-03-09 NOTE — ED Provider Notes (Signed)
[facility] DEPT Provider Note   CSN: [id number] Arrival date & time: 03/09/17  1028   By signing my name below, I, Kristie [name], attest that this documentation has been prepared under the direction and in the presence of Virgel Manifold, MD. Electronically signed, Kristie [name], ED Scribe. 03/09/17. 11:34 AM.  History   Chief Complaint Chief Complaint  Patient presents with  . Weakness   The history is provided by the patient, medical records and the spouse. No language interpreter was used.    Kristie [name] is a 83 y.o. female with h/o frequent UTI's and sepsis presenting to the Emergency Department concerning recurring bilateral lower extremity weakness spells x ~4-6 months that worsened 4 days ago. Associated cough, fatigue and fever this morning. She states she fell at home today. Pt had a UTI 6 months ago with similar symptoms; husband states 6 days ago they took an at home test with positive results. Pt given macrobid from her PCP for UTI recently. New prescribed sleep medication noted. Pt ambulatory with a walker at baseline. She states she is not on supplemental oxygen at home. No SOB, nausea, dysuria, hematuria, frequency, urgency or any other complaints noted at this time.   Past Medical History:  Diagnosis Date  . Cataract   . Fibromyalgia 04/25/2014  . Hyperlipidemia   . Hypertension   . Lesion of sciatic nerve   . Low back pain   . Memory loss   . Spinal stenosis of lumbar region 04/25/2014  . Tremor   . Weakness     Patient Active Problem List   Diagnosis Date Noted  . Primary osteoarthritis of both knees 10/19/2016  . DDD (degenerative disc disease), lumbar 10/19/2016  . Osteopenia of multiple sites 10/19/2016  . Bilateral leg weakness 09/15/2016  . Vitamin D deficiency 09/15/2016  . Cognitive impairment 09/13/2016  . Depression 08/05/2016  . Memory deficit 08/05/2016  . Recurrent UTI 06/24/2016  . Spinal stenosis of lumbar region 04/25/2014  .  Fibromyalgia syndrome 04/25/2014  . Urine incontinence 04/25/2014  . Senile osteoporosis 04/25/2014  . Insomnia 04/25/2014  . Dysphonia 10/21/2013  . Right foot drop 04/27/2013  . Abnormality of gait 04/27/2013  . Weakness   . Hyperlipidemia 04/14/2012  . Pubic bone fracture ([place]) 04/14/2012  . Urge incontinence 09/15/2011  . Essential hypertension 08/18/2007  . Primary osteoarthritis of both hands 08/18/2007  . OTHER ACQUIRED DEFORMITY OF ANKLE AND FOOT OTHER 08/18/2007  . UNEQUAL LEG LENGTH 08/18/2007  . COLONIC POLYPS, HX OF 08/18/2007    Past Surgical History:  Procedure Laterality Date  . ABDOMINAL HYSTERECTOMY  1963  . APPENDECTOMY    . CATARACT EXTRACTION W/ INTRAOCULAR LENS  IMPLANT, BILATERAL  2005 and 2007  . COLON SURGERY  1974   colon polyp  . COLON SURGERY  2002   colon polyps(2)  Dr Collene Mares  . COLONOSCOPY W/ POLYPECTOMY  1974and 2002  . EYE SURGERY  2005/2007   cataract renoval  Dr. Bing Plume  . KNEE SURGERY  2009   Dr. Ronnie Derby  . LUMBAR LAMINECTOMY  2012   L4-5 with spinous process fixation (06/22/11) Dr. Tommi Emery  . schwanoma  1987   residual right foot drop and right leg pain. Dr. Deirdre Pippins  . SMALL INTESTINE SURGERY  1990   bowel obstruction, Dr. Rebekah Chesterfield  . TONSILLECTOMY  1940    OB History    Gravida Para Term Preterm AB Living   5 4     1  SAB TAB Ectopic Multiple Live Births                   Home Medications    Prior to Admission medications   Medication Sig Start Date End Date Taking? Authorizing Provider  acetaminophen (TYLENOL) 500 MG tablet Take 1,000 mg by mouth at bedtime.    Yes [provider]  amLODipine (NORVASC) 10 MG tablet Take 1 tablet (10 mg total) by mouth daily. 08/16/16  Yes Mast, Man X, NP  aspirin 81 MG chewable tablet Chew 81 mg by mouth daily.   Yes [provider]  cholecalciferol (VITAMIN D) 1000 UNITS tablet Take 2,000 Units by mouth daily.    Yes [provider]  diclofenac sodium (VOLTAREN)  1 % GEL APPLY 4 GRAMS TO 3 LARGE JOINTS UP TO 3 TIMES A DAY AS NEEDED Patient taking differently: APPLY 4 GRAMS TO 3 LARGE JOINTS UP TO 3 TIMES A DAY AS NEEDED FOR PAIN 12/29/16  Yes Panwala, Naitik, PA-C  donepezil (ARICEPT) 10 MG tablet Take 10 mg by mouth 2 (two) times daily.   Yes [provider]  fluticasone (FLONASE) 50 MCG/ACT nasal spray Place 2 sprays into both nostrils daily. 2 sprays in each nostril once daily Patient taking differently: Place 2 sprays into both nostrils daily.  08/16/16  Yes Mast, Man X, NP  gabapentin (NEURONTIN) 800 MG tablet TAKE 1 TABLET 3 TIMES A DAY. 01/31/17  Yes Mast, Man X, NP  ibuprofen (ADVIL,MOTRIN) 200 MG tablet Take 200 mg by mouth every morning.   Yes [provider]  Lactobacillus (PROBIOTIC ACIDOPHILUS PO) Take 1 capsule by mouth daily.   Yes [provider]  losartan (COZAAR) 50 MG tablet Take 1 tablet (50 mg total) by mouth daily. 08/16/16  Yes Mast, Man X, NP  methenamine (HIPREX) 1 g tablet Take 1 g by mouth 2 (two) times daily with a meal. 09/23/16  Yes [provider]  metoprolol tartrate (LOPRESSOR) 25 MG tablet TAKE 1 TABLET TWICE DAILY TO CONTROL BLOOD PRESSURE. 12/27/16  Yes Estill Dooms, MD  mirabegron ER (MYRBETRIQ) 25 MG TB24 tablet Take 1 tablet (25 mg total) by mouth daily. Patient taking differently: Take 25 mg by mouth at bedtime.  08/16/16  Yes Mast, Man X, NP  nitrofurantoin, macrocrystal-monohydrate, (MACROBID) 100 MG capsule Take 1 capsule (100 mg total) by mouth 2 (two) times daily. 03/04/17  Yes Blanchie Serve, MD  sertraline (ZOLOFT) 50 MG tablet TAKE 1 TABLET EACH DAY. 03/08/17  Yes Pandey, Mahima, MD  simvastatin (ZOCOR) 10 MG tablet Take 1 tablet (10 mg total) by mouth every other day. 12/03/16  Yes Estill Dooms, MD  solifenacin (VESICARE) 10 MG tablet Take 1 tablet (10 mg total) by mouth daily. 02/09/17  Yes Eulas Post, Monica, DO  zolpidem (AMBIEN) 10 MG tablet Take 0.5 tablets (5 mg total) by mouth at  bedtime. For 1 week and stop Patient taking differently: Take 10 mg by mouth at bedtime.  03/01/17  Yes Blanchie Serve, MD  eszopiclone (LUNESTA) 1 MG TABS tablet Take 1 tablet (1 mg total) by mouth at bedtime. Take immediately before bedtime Patient not taking: Reported on 03/09/2017 03/01/17   Blanchie Serve, MD  saccharomyces boulardii (FLORASTOR) 250 MG capsule Take 1 capsule (250 mg total) by mouth 2 (two) times daily. Patient not taking: Reported on 03/09/2017 03/04/17   Blanchie Serve, MD    Family History Family History  Problem Relation Age of Onset  . Breast cancer  Mother   . Heart disease Mother   . Dementia Father   . Cancer Sister        Esophageal     Social History Social History  Substance Use Topics  . Smoking status: Former Smoker    Quit date: 04/25/1974  . Smokeless tobacco: Never Used     Comment: Quit thirty years ago.  . Alcohol use 1.2 oz/week    2 Glasses of wine per week     Comment: 2 glasses of wine daily     Allergies   Restoril [temazepam] and Bactrim [sulfamethoxazole-trimethoprim]   Review of Systems Review of Systems  Constitutional: Positive for fatigue and fever.  Respiratory: Positive for cough. Negative for shortness of breath.   Gastrointestinal: Negative for nausea and vomiting.  Genitourinary: Negative for decreased urine volume, dysuria, frequency, hematuria and urgency.  Musculoskeletal: Positive for gait problem. Negative for joint swelling.  Skin: Negative for color change and wound.  Neurological: Positive for weakness. Negative for numbness.  All other systems reviewed and are negative.    Physical Exam Updated Vital Signs BP 111/72 (BP Location: Right Arm)   Pulse 80   Temp 100.1 F (37.8 C) (Rectal)   Resp 20   Ht 5\' 8"  (1.727 m)   Wt 175 lb (79.4 kg)   SpO2 90%   BMI 26.61 kg/m   Physical Exam  Constitutional: She is oriented to person, place, and time. She appears well-developed and well-nourished. No distress.    Warm to touch  HENT:  Head: Normocephalic and atraumatic.  Eyes: EOM are normal.  Neck: Normal range of motion.  Cardiovascular: Normal rate, regular rhythm and normal heart sounds.   Pulmonary/Chest: Effort normal and breath sounds normal.  Abdominal: Soft. She exhibits no distension. There is no tenderness.  Musculoskeletal: Normal range of motion.  Neurological: She is alert and oriented to person, place, and time.  Decreased sensation to touch RLE  Skin: Skin is warm and dry.  Psychiatric: She has a normal mood and affect. Judgment normal.  Nursing note and vitals reviewed.    ED Treatments / Results  DIAGNOSTIC STUDIES: Oxygen Saturation is 90% on , low by my interpretation.    COORDINATION OF CARE: 11:29 AM-Discussed next steps with pt. Pt verbalized understanding and is agreeable with the plan. Will order medications, blood work and labs.   Labs (all labs ordered are listed, but only abnormal results are displayed) Labs Reviewed  URINALYSIS, ROUTINE W REFLEX MICROSCOPIC - Abnormal; Notable for the following:       Result Value   APPearance HAZY (*)    Hgb urine dipstick TRACE (*)    All other components within normal limits  CBC WITH DIFFERENTIAL/PLATELET - Abnormal; Notable for the following:    Neutro Abs 8.2 (*)    All other components within normal limits  BASIC METABOLIC PANEL - Abnormal; Notable for the following:    Glucose, Bld 127 (*)    Calcium 8.6 (*)    All other components within normal limits  URINALYSIS, MICROSCOPIC (REFLEX) - Abnormal; Notable for the following:    Bacteria, UA FEW (*)    Squamous Epithelial / LPF 6-30 (*)    All other components within normal limits  URINE CULTURE  I-STAT CG4 LACTIC ACID, ED    EKG  EKG Interpretation None       Radiology Dg Chest 2 View  Result Date: 03/09/2017 CLINICAL DATA:  Fever, weakness for 2 days EXAM: CHEST  2 VIEW  COMPARISON:  09/12/2016 FINDINGS: There is focal left apical opacity which  may reflect a pulmonary nodule versus artifact resulting from the adjacent first ribs. There is no other focal parenchymal opacity. There is no pleural effusion or pneumothorax. The heart and mediastinal contours are unremarkable. The osseous structures are unremarkable. IMPRESSION: 1. There is focal left apical opacity which may reflect a pulmonary nodule versus artifact resulting from the adjacent first ribs. Recommend apical lordotic PA chest x-ray for further evaluation. Electronically Signed   By: Kathreen Devoid   On: 03/09/2017 14:05    Procedures Procedures (including critical care time)  Medications Ordered in ED Medications  sodium chloride 0.9 % bolus 1,000 mL (1,000 mLs Intravenous New Bag/Given 03/09/17 1155)     Initial Impression / Assessment and Plan / ED Course  I have reviewed the triage vital signs and the nursing notes.  Pertinent labs & imaging results that were available during my care of the patient were reviewed by me and considered in my medical decision making (see chart for details).     88yF with generalized weaknessShe has baseline neuropathy/foot drop RLE from prior surgical injury. Her neuro exam is nonfocal otherwise. I suspect from febrile process. Currently being tx'd for UTI. UA today not overly impressive. She has also been complaining of a cough and o2 sats low 90s on RA. CXR w/o focal infiltrate, but clinically question possible pneumonia.    Final Clinical Impressions(s) / ED Diagnoses   Final diagnoses:  Generalized weakness  Community acquired pneumonia, unspecified laterality    New Prescriptions New Prescriptions   No medications on file    I personally preformed the services scribed in my presence. The recorded information has been reviewed is accurate. Virgel Manifold, MD.     Virgel Manifold, MD 03/09/17 [phone]

## 2017-03-09 NOTE — ED Notes (Signed)
Attempted report x2. Floor RN notified that ED RN would give bedside report.

## 2017-03-09 NOTE — H&P (Signed)
History and Physical    Kristie [name] FBP:[id number] DOB: 1928/09/08 DOA: 03/09/2017  PCP: Blanchie Serve, MD Patient coming from: home  Chief Complaint: weakness  HPI: Kristie [name] is a 83 y.o. female with medical history significant of her mouth, hypokalemia, hypertension, chronic sciatica, memory loss, tremor. Patient reports a several-day history of gradual progressive weakness and fatigue. Associated with intermittent cough. Patient endorses fever which started on the day of admission as high as 101. Patient was started on Macrobid for recurrent UTI by her PCP on 03/04/2017. Patient denies dysuria, frequency, abdominal pain, chest pain, palpitations, nausea, vomiting. Patient has endorsed decreased oral intake over this period of time.  ED Course: *Ceftriaxone and azithromycin. Given a 1 L bolus. Objective findings outlined below.  Review of Systems: As per HPI otherwise all other systems reviewed and are negative   Past Medical History:  Diagnosis Date  . Cataract   . Fibromyalgia 04/25/2014  . Hyperlipidemia   . Hypertension   . Lesion of sciatic nerve   . Low back pain   . Memory loss   . Spinal stenosis of lumbar region 04/25/2014  . Tremor   . Weakness     Past Surgical History:  Procedure Laterality Date  . ABDOMINAL HYSTERECTOMY  1963  . APPENDECTOMY    . CATARACT EXTRACTION W/ INTRAOCULAR LENS  IMPLANT, BILATERAL  2005 and 2007  . COLON SURGERY  1974   colon polyp  . COLON SURGERY  2002   colon polyps(2)  Dr Collene Mares  . COLONOSCOPY W/ POLYPECTOMY  1974and 2002  . EYE SURGERY  2005/2007   cataract renoval  Dr. Bing Plume  . KNEE SURGERY  2009   Dr. Ronnie Derby  . LUMBAR LAMINECTOMY  2012   L4-5 with spinous process fixation (06/22/11) Dr. Tommi Emery  . schwanoma  1987   residual right foot drop and right leg pain. Dr. Deirdre Pippins  . SMALL INTESTINE SURGERY  1990   bowel obstruction, Dr. Rebekah Chesterfield  . TONSILLECTOMY  1940    Social History   Social History  . Marital status:  Married    Spouse name: N/A  . Number of children: 4  . Years of education: N/A   Occupational History  . retired Public relations account executive  Retired    retired   Social History Main Topics  . Smoking status: Former Smoker    Quit date: 04/25/1974  . Smokeless tobacco: Never Used     Comment: Quit thirty years ago.  . Alcohol use 1.2 oz/week    2 Glasses of wine per week     Comment: 2 glasses of wine daily  . Drug use: No  . Sexual activity: Not on file   Other Topics Concern  . Not on file   Social History Narrative   Patient is retired Public relations account executive and lives with her husband, (married since 1980) at [place] since 2013   Patient  is right handed. She drinks 1 cup of coffee per day.    She does not have any pets.   Former smoker for 20 years   Exercise none                          Allergies  Allergen Reactions  . Restoril [Temazepam]     Nightmares  . Bactrim [Sulfamethoxazole-Trimethoprim] Nausea And Vomiting    Family History  Problem Relation Age of Onset  . Breast cancer Mother   . Heart disease Mother   .  Dementia Father   . Cancer Sister        Esophageal       Prior to Admission medications   Medication Sig Start Date End Date Taking? Authorizing Provider  acetaminophen (TYLENOL) 500 MG tablet Take 1,000 mg by mouth at bedtime.    Yes [provider]  amLODipine (NORVASC) 10 MG tablet Take 1 tablet (10 mg total) by mouth daily. 08/16/16  Yes Mast, Man X, NP  aspirin 81 MG chewable tablet Chew 81 mg by mouth daily.   Yes [provider]  cholecalciferol (VITAMIN D) 1000 UNITS tablet Take 2,000 Units by mouth daily.    Yes [provider]  diclofenac sodium (VOLTAREN) 1 % GEL APPLY 4 GRAMS TO 3 LARGE JOINTS UP TO 3 TIMES A DAY AS NEEDED Patient taking differently: APPLY 4 GRAMS TO 3 LARGE JOINTS UP TO 3 TIMES A DAY AS NEEDED FOR PAIN 12/29/16  Yes Panwala, Naitik, PA-C  donepezil (ARICEPT) 10 MG tablet Take 10 mg by mouth 2 (two)  times daily.   Yes [provider]  fluticasone (FLONASE) 50 MCG/ACT nasal spray Place 2 sprays into both nostrils daily. 2 sprays in each nostril once daily Patient taking differently: Place 2 sprays into both nostrils daily.  08/16/16  Yes Mast, Man X, NP  gabapentin (NEURONTIN) 800 MG tablet TAKE 1 TABLET 3 TIMES A DAY. 01/31/17  Yes Mast, Man X, NP  ibuprofen (ADVIL,MOTRIN) 200 MG tablet Take 200 mg by mouth every morning.   Yes [provider]  Lactobacillus (PROBIOTIC ACIDOPHILUS PO) Take 1 capsule by mouth daily.   Yes [provider]  losartan (COZAAR) 50 MG tablet Take 1 tablet (50 mg total) by mouth daily. 08/16/16  Yes Mast, Man X, NP  methenamine (HIPREX) 1 g tablet Take 1 g by mouth 2 (two) times daily with a meal. 09/23/16  Yes [provider]  metoprolol tartrate (LOPRESSOR) 25 MG tablet TAKE 1 TABLET TWICE DAILY TO CONTROL BLOOD PRESSURE. 12/27/16  Yes Estill Dooms, MD  mirabegron ER (MYRBETRIQ) 25 MG TB24 tablet Take 1 tablet (25 mg total) by mouth daily. Patient taking differently: Take 25 mg by mouth at bedtime.  08/16/16  Yes Mast, Man X, NP  nitrofurantoin, macrocrystal-monohydrate, (MACROBID) 100 MG capsule Take 1 capsule (100 mg total) by mouth 2 (two) times daily. 03/04/17  Yes Blanchie Serve, MD  sertraline (ZOLOFT) 50 MG tablet TAKE 1 TABLET EACH DAY. 03/08/17  Yes Pandey, Mahima, MD  simvastatin (ZOCOR) 10 MG tablet Take 1 tablet (10 mg total) by mouth every other day. 12/03/16  Yes Estill Dooms, MD  solifenacin (VESICARE) 10 MG tablet Take 1 tablet (10 mg total) by mouth daily. 02/09/17  Yes Eulas Post, Monica, DO  zolpidem (AMBIEN) 10 MG tablet Take 0.5 tablets (5 mg total) by mouth at bedtime. For 1 week and stop Patient taking differently: Take 10 mg by mouth at bedtime.  03/01/17  Yes Blanchie Serve, MD  eszopiclone (LUNESTA) 1 MG TABS tablet Take 1 tablet (1 mg total) by mouth at bedtime. Take immediately before bedtime Patient not taking:  Reported on 03/09/2017 03/01/17   Blanchie Serve, MD  saccharomyces boulardii (FLORASTOR) 250 MG capsule Take 1 capsule (250 mg total) by mouth 2 (two) times daily. Patient not taking: Reported on 03/09/2017 03/04/17   Blanchie Serve, MD    Physical Exam: Vitals:   03/09/17 1430 03/09/17 1500 03/09/17 1545 03/09/17 1615  BP: 127/72 115/72 114/66 111/69  Pulse: 81  81 82 79  Resp: 15 17 17 16   Temp:      TempSrc:      SpO2: 93% 96% 94% 94%  Weight:      Height:         General: Appears elderly and weak. Lying in bed. Eyes:  PERRL, EOMI, normal lids, iris ENT:  grossly normal hearing, lips & tongue, mmm Neck:  no LAD, masses or thyromegaly Cardiovascular:  RRR, no m/r/g. No LE edema.  Respiratory:  CTA bilaterally, no w/r/r. Normal respiratory effort. Abdomen:  soft, ntnd, NABS Skin:  no rash or induration seen on limited exam Musculoskeletal:  grossly normal tone BUE/BLE, good ROM, no bony abnormality Psychiatric:  grossly normal mood and affect, speech fluent and appropriate, AOx3 Neurologic:  CN 2-12 grossly intact, moves all extremities in coordinated fashion, sensation intact  Labs on Admission: I have personally reviewed following labs and imaging studies  CBC:  Recent Labs Lab 03/09/17 1150  WBC 10.0  NEUTROABS 8.2*  HGB 12.8  HCT 40.0  MCV 95.0  PLT 681   Basic Metabolic Panel:  Recent Labs Lab 03/09/17 1150  NA 141  K 3.9  CL 107  CO2 24  GLUCOSE 127*  BUN 12  CREATININE 0.73  CALCIUM 8.6*   GFR: Estimated Creatinine Clearance: 55.9 mL/min (by C-G formula based on SCr of 0.73 mg/dL). Liver Function Tests: No results for input(s): AST, ALT, ALKPHOS, BILITOT, PROT, ALBUMIN in the last 168 hours. No results for input(s): LIPASE, AMYLASE in the last 168 hours. No results for input(s): AMMONIA in the last 168 hours. Coagulation Profile: No results for input(s): INR, PROTIME in the last 168 hours. Cardiac Enzymes: No results for input(s): CKTOTAL, CKMB,  CKMBINDEX, TROPONINI in the last 168 hours. BNP (last 3 results) No results for input(s): PROBNP in the last 8760 hours. HbA1C: No results for input(s): HGBA1C in the last 72 hours. CBG: No results for input(s): GLUCAP in the last 168 hours. Lipid Profile: No results for input(s): CHOL, HDL, LDLCALC, TRIG, CHOLHDL, LDLDIRECT in the last 72 hours. Thyroid Function Tests: No results for input(s): TSH, T4TOTAL, FREET4, T3FREE, THYROIDAB in the last 72 hours. Anemia Panel: No results for input(s): VITAMINB12, FOLATE, FERRITIN, TIBC, IRON, RETICCTPCT in the last 72 hours. Urine analysis:    Component Value Date/Time   COLORURINE YELLOW 03/09/2017 1230   APPEARANCEUR HAZY (A) 03/09/2017 1230   LABSPEC 1.010 03/09/2017 1230   PHURINE 6.0 03/09/2017 1230   GLUCOSEU NEGATIVE 03/09/2017 1230   HGBUR TRACE (A) 03/09/2017 1230   BILIRUBINUR NEGATIVE 03/09/2017 1230   KETONESUR NEGATIVE 03/09/2017 1230   PROTEINUR NEGATIVE 03/09/2017 1230   UROBILINOGEN 0.2 04/15/2012 0720   NITRITE NEGATIVE 03/09/2017 1230   LEUKOCYTESUR NEGATIVE 03/09/2017 1230    Creatinine Clearance: Estimated Creatinine Clearance: 55.9 mL/min (by C-G formula based on SCr of 0.73 mg/dL).  Sepsis Labs: @LABRCNTIP (procalcitonin:4,lacticidven:4) )No results found for this or any previous visit (from the past 240 hour(s)).   Radiological Exams on Admission: Dg Chest 2 View  Result Date: 03/09/2017 CLINICAL DATA:  Fever, weakness for 2 days EXAM: CHEST  2 VIEW COMPARISON:  09/12/2016 FINDINGS: There is focal left apical opacity which may reflect a pulmonary nodule versus artifact resulting from the adjacent first ribs. There is no other focal parenchymal opacity. There is no pleural effusion or pneumothorax. The heart and mediastinal contours are unremarkable. The osseous structures are unremarkable. IMPRESSION: 1. There is focal left apical opacity which may reflect a pulmonary nodule  versus artifact resulting from the  adjacent first ribs. Recommend apical lordotic PA chest x-ray for further evaluation. Electronically Signed   By: Kathreen Devoid   On: 03/09/2017 14:05    Assessment/Plan Active Problems:   Essential hypertension   Fibromyalgia syndrome   Recurrent UTI   CAP (community acquired pneumonia)   Generalized weakness: pt unable to ambulate safely at this time due to weakness. Likely due to poor physical reserve at basesline in the setting of poor oral intake and acute infectious process.  - PT/OT/nutrition - IVF   Possible CAP: Pt w/ fevers and cough and is clinically dehydrated. CXR w/o evidence of acute infectious process. CTX and Azithro started in ED - Pneumonia protocol - repeat CXR in am - procalcitionin, respiratory viral panel, flu  Recurrent/chronic UTI: UA not overly impressive and contaminated with 6-30 squamous epithelial. On Hiprex chronically. - Continue with outpatient Macrobid - Follow-up on urine culture - Continue Vesicare - Hold Hiprex  HTN: - continue lopressor, Norvasc  HLD: - continue statin  Chronic pain: - cotninue neurontin  Dementia: - continue Aricept  Depression: - contineu zoloft  Insomnia: - continue Ambien   DVT prophylaxis: heparin  Code Status: full  Family Communication: husband  Disposition Plan: pending improvement   Consults called: none  Admission status: observation    Kissa Campoy J MD [facility]  If 7PM-7AM, please contact night-coverage [email or web address] Password [facility]  03/09/2017, 5:56 PM

## 2017-03-09 NOTE — ED Notes (Signed)
Assisted pt using bedside commode. No difficulties pt states feeling weakness.

## 2017-03-09 NOTE — ED Triage Notes (Signed)
Patient presents to ed c/o generalized weakness for 4 days ,states she was started on antibiotics for UTI normally uses a walker to ambulate , states she was using her walker to go to the bathroom this am and became weak didn't fall just sat down on her chair, husband thought her mouth looked twisted. Currently alert oriented denies pain states her appetite is good, States she was standing in her meal line last pm and had a feeling of weakness , her husband got her  A chair and had to bring her food to her.

## 2017-03-10 ENCOUNTER — Observation Stay (HOSPITAL_COMMUNITY): Payer: Medicare Other

## 2017-03-10 DIAGNOSIS — M797 Fibromyalgia: Secondary | ICD-10-CM | POA: Diagnosis not present

## 2017-03-10 DIAGNOSIS — I10 Essential (primary) hypertension: Secondary | ICD-10-CM | POA: Diagnosis not present

## 2017-03-10 DIAGNOSIS — J189 Pneumonia, unspecified organism: Principal | ICD-10-CM

## 2017-03-10 DIAGNOSIS — E784 Other hyperlipidemia: Secondary | ICD-10-CM | POA: Diagnosis not present

## 2017-03-10 DIAGNOSIS — R531 Weakness: Secondary | ICD-10-CM | POA: Diagnosis not present

## 2017-03-10 DIAGNOSIS — F5101 Primary insomnia: Secondary | ICD-10-CM | POA: Diagnosis not present

## 2017-03-10 DIAGNOSIS — N6320 Unspecified lump in the left breast, unspecified quadrant: Secondary | ICD-10-CM | POA: Diagnosis not present

## 2017-03-10 DIAGNOSIS — N390 Urinary tract infection, site not specified: Secondary | ICD-10-CM | POA: Diagnosis not present

## 2017-03-10 DIAGNOSIS — N289 Disorder of kidney and ureter, unspecified: Secondary | ICD-10-CM | POA: Diagnosis not present

## 2017-03-10 DIAGNOSIS — E876 Hypokalemia: Secondary | ICD-10-CM | POA: Diagnosis not present

## 2017-03-10 DIAGNOSIS — J181 Lobar pneumonia, unspecified organism: Secondary | ICD-10-CM | POA: Diagnosis not present

## 2017-03-10 LAB — RESPIRATORY PANEL BY PCR
Adenovirus: NOT DETECTED
BORDETELLA PERTUSSIS-RVPCR: NOT DETECTED
CHLAMYDOPHILA PNEUMONIAE-RVPPCR: NOT DETECTED
CORONAVIRUS HKU1-RVPPCR: NOT DETECTED
CORONAVIRUS NL63-RVPPCR: NOT DETECTED
Coronavirus 229E: NOT DETECTED
Coronavirus OC43: NOT DETECTED
INFLUENZA A H1 2009-RVPPR: NOT DETECTED
INFLUENZA A H3-RVPPCR: NOT DETECTED
INFLUENZA B-RVPPCR: NOT DETECTED
Influenza A H1: NOT DETECTED
Influenza A: NOT DETECTED
MYCOPLASMA PNEUMONIAE-RVPPCR: NOT DETECTED
Metapneumovirus: NOT DETECTED
PARAINFLUENZA VIRUS 2-RVPPCR: NOT DETECTED
Parainfluenza Virus 1: NOT DETECTED
Parainfluenza Virus 3: NOT DETECTED
Parainfluenza Virus 4: NOT DETECTED
Respiratory Syncytial Virus: NOT DETECTED
Rhinovirus / Enterovirus: NOT DETECTED

## 2017-03-10 LAB — CBC
HEMATOCRIT: 38.4 % (ref 36.0–46.0)
HEMOGLOBIN: 12.2 g/dL (ref 12.0–15.0)
MCH: 30.7 pg (ref 26.0–34.0)
MCHC: 31.8 g/dL (ref 30.0–36.0)
MCV: 96.5 fL (ref 78.0–100.0)
Platelets: 180 10*3/uL (ref 150–400)
RBC: 3.98 MIL/uL (ref 3.87–5.11)
RDW: 14.4 % (ref 11.5–15.5)
WBC: 8 10*3/uL (ref 4.0–10.5)

## 2017-03-10 LAB — URINE CULTURE

## 2017-03-10 LAB — URINALYSIS, ROUTINE W REFLEX MICROSCOPIC
Bilirubin Urine: NEGATIVE
Glucose, UA: NEGATIVE mg/dL
Hgb urine dipstick: NEGATIVE
Ketones, ur: NEGATIVE mg/dL
Leukocytes, UA: NEGATIVE
NITRITE: NEGATIVE
Protein, ur: NEGATIVE mg/dL
SPECIFIC GRAVITY, URINE: 1.011 (ref 1.005–1.030)
pH: 6 (ref 5.0–8.0)

## 2017-03-10 LAB — BASIC METABOLIC PANEL
Anion gap: 7 (ref 5–15)
BUN: 10 mg/dL (ref 6–20)
CHLORIDE: 107 mmol/L (ref 101–111)
CO2: 27 mmol/L (ref 22–32)
Calcium: 8.5 mg/dL — ABNORMAL LOW (ref 8.9–10.3)
Creatinine, Ser: 0.67 mg/dL (ref 0.44–1.00)
GFR calc Af Amer: >60 mL/min (ref >60)
GFR calc non Af Amer: >60 mL/min (ref >60)
Glucose, Bld: 104 mg/dL — ABNORMAL HIGH (ref 65–99)
POTASSIUM: 3.3 mmol/L — AB (ref 3.5–5.1)
SODIUM: 141 mmol/L (ref 135–145)

## 2017-03-10 LAB — HIV ANTIBODY (ROUTINE TESTING W REFLEX): HIV SCREEN 4TH GENERATION: NONREACTIVE

## 2017-03-10 LAB — INFLUENZA PANEL BY PCR (TYPE A & B)
INFLAPCR: NEGATIVE
INFLBPCR: NEGATIVE

## 2017-03-10 LAB — STREP PNEUMONIAE URINARY ANTIGEN: Strep Pneumo Urinary Antigen: NEGATIVE

## 2017-03-10 MED ORDER — POTASSIUM CHLORIDE CRYS ER 20 MEQ PO TBCR
40.0000 meq | EXTENDED_RELEASE_TABLET | Freq: Two times a day (BID) | ORAL | Status: AC
  Administered 2017-03-10 (×2): 40 meq via ORAL
  Filled 2017-03-10 (×2): qty 2

## 2017-03-10 MED ORDER — HEPARIN (PORCINE) IN NACL 100-0.45 UNIT/ML-% IJ SOLN
1200.0000 [IU]/h | INTRAMUSCULAR | Status: DC
  Administered 2017-03-10: 1350 [IU]/h via INTRAVENOUS
  Filled 2017-03-10 (×3): qty 250

## 2017-03-10 MED ORDER — IOPAMIDOL (ISOVUE-300) INJECTION 61%
INTRAVENOUS | Status: AC
  Administered 2017-03-10: 75 mL
  Filled 2017-03-10: qty 75

## 2017-03-10 MED ORDER — HEPARIN BOLUS VIA INFUSION
5000.0000 [IU] | Freq: Once | INTRAVENOUS | Status: AC
  Administered 2017-03-10: 5000 [IU] via INTRAVENOUS
  Filled 2017-03-10: qty 5000

## 2017-03-10 MED ORDER — GUAIFENESIN ER 600 MG PO TB12
1200.0000 mg | ORAL_TABLET | Freq: Two times a day (BID) | ORAL | Status: DC
  Administered 2017-03-10 – 2017-03-12 (×5): 1200 mg via ORAL
  Filled 2017-03-10 (×6): qty 2

## 2017-03-10 MED ORDER — NITROFURANTOIN MONOHYD MACRO 100 MG PO CAPS
100.0000 mg | ORAL_CAPSULE | Freq: Two times a day (BID) | ORAL | Status: DC
  Administered 2017-03-10: 100 mg via ORAL
  Filled 2017-03-10: qty 1

## 2017-03-10 NOTE — Evaluation (Signed)
Occupational Therapy Evaluation and Discharge Patient Details Name: Kristie [name] MRN: [id number] DOB: Nov 18, 1928 Today's Date: 03/10/2017    History of Present Illness Pt is an 83 y/o female admitted secondary to generalized weakness. Pt found to have CAP. PMH including but not limited to HTN, HLD, dementia and Lumbar laminectomy in 2012.   Clinical Impression   PTA Pt independent in ADL/IADL (does not cook) and mobility with rollator. Pt is currently min guard for safety with ADL and mobility with RW this session. OT spent time talking about benefits of meaningful occupations and how exercise can be incorporated in those kinds of activities. Pt and husband also educated on energy conservation during ADL. Pt and husband verbalized understanding. No further questions or concerns from Pt or husband at the end of the session. Pt wishes to proceed with PT only at friends home, so OT to sign off at this time. Thank you for the opportunity to serve this individual.     Follow Up Recommendations  No OT follow up;Supervision/Assistance - 24 hour    Equipment Recommendations  None recommended by OT (Pt has appropriate DME)    Recommendations for Other Services       Precautions / Restrictions Precautions Precautions: Fall Restrictions Weight Bearing Restrictions: No      Mobility Bed Mobility Overal bed mobility: Needs Assistance Bed Mobility: Supine to Sit;Sit to Supine     Supine to sit: Min guard Sit to supine: Min guard   General bed mobility comments: increased time and effort, min guard for safety, use of bed rails by Pt   Transfers Overall transfer level: Needs assistance Equipment used: Rolling walker (2 wheeled) Transfers: Sit to/from Stand Sit to Stand: Min guard         General transfer comment: increased time and effort, good technique, min guard for safety    Balance Overall balance assessment: Needs assistance Sitting-balance support: Feet supported Sitting  balance-Leahy Scale: Good Sitting balance - Comments: able to tie shoe sitting EOB   Standing balance support: During functional activity;Bilateral upper extremity supported Standing balance-Leahy Scale: Poor Standing balance comment: pt reliant on bilateral UEs on RW                           ADL either performed or assessed with clinical judgement   ADL Overall ADL's : Needs assistance/impaired                                       General ADL Comments: Pt min guard for all ADL. Session focused on education for safety while bathing and energy conservation.     Vision Baseline Vision/History: Wears glasses Patient Visual Report: No change from baseline       Perception     Praxis      Pertinent Vitals/Pain Pain Assessment: No/denies pain     Hand Dominance Right   Extremity/Trunk Assessment Upper Extremity Assessment Upper Extremity Assessment: Generalized weakness   Lower Extremity Assessment Lower Extremity Assessment: Defer to PT evaluation       Communication Communication Communication: No difficulties   Cognition Arousal/Alertness: Awake/alert Behavior During Therapy: WFL for tasks assessed/performed Overall Cognitive Status: Within Functional Limits for tasks assessed  General Comments  Pt's husband present for session    Exercises     Shoulder Instructions      Home Living Family/patient expects to be discharged to:: Private residence Living Arrangements: Spouse/significant other Available Help at Discharge: Family;Available 24 hours/day Type of Home: Independent living facility ([facility]) Home Access: Level entry     Home Layout: One level     Bathroom Shower/Tub: Occupational psychologist: Standard     Home Equipment: Cane - single point;Walker - 4 wheels;Wheelchair - manual;Grab bars - tub/shower;Grab bars - toilet;Shower seat - built  in   Additional Comments: Pt has worked with PT at [facility] before and is familiar with them and like them      Prior Functioning/Environment Level of Independence: Independent with assistive device(s)        Comments: walks with R AFO and rollator        OT Problem List: Decreased strength;Decreased activity tolerance      OT Treatment/Interventions:      OT Goals(Current goals can be found in the care plan section) Acute Rehab OT Goals Patient Stated Goal: return home OT Goal Formulation: With patient/family Time For Goal Achievement: 03/24/17 Potential to Achieve Goals: Good  OT Frequency:     Barriers to D/C:            Co-evaluation              AM-PAC PT "6 Clicks" Daily Activity     Outcome Measure Help from another person eating meals?: None Help from another person taking care of personal grooming?: A Little Help from another person toileting, which includes using toliet, bedpan, or urinal?: A Little Help from another person bathing (including washing, rinsing, drying)?: A Little Help from another person to put on and taking off regular upper body clothing?: None Help from another person to put on and taking off regular lower body clothing?: A Little 6 Click Score: 20   End of Session Equipment Utilized During Treatment: Gait belt;Rolling walker;Other (comment) (R AFO (patients)) Nurse Communication: Mobility status;Other (comment) (purewick, and spirometer?)  Activity Tolerance: Patient tolerated treatment well Patient left: in bed;with call bell/phone within reach;with bed alarm set;with family/visitor present  OT Visit Diagnosis: Unsteadiness on feet (R26.81);Muscle weakness (generalized) (M62.81)                Time: 6226-3335 OT Time Calculation (min): 13 min Charges:  OT General Charges $OT Visit: 1 Procedure OT Evaluation $OT Eval Low Complexity: 1 Procedure G-Codes: OT G-codes **NOT FOR INPATIENT CLASS** Functional Assessment Tool Used:  AM-PAC 6 Clicks Daily Activity Functional Limitation: Self care Self Care Current Status (K5625): At least 20 percent but less than 40 percent impaired, limited or restricted Self Care Goal Status (W3893): At least 1 percent but less than 20 percent impaired, limited or restricted Self Care Discharge Status [id number]): At least 20 percent but less than 40 percent impaired, limited or restricted   Hulda Humphrey OTR/L [place] 03/10/2017, 5:11 PM

## 2017-03-10 NOTE — Significant Event (Signed)
The radiologist notified me that patient's CAT scan was showing right middle lobe pulmonary embolism. Discussed with patient's nurse, patient is hemodynamically stable. I have started patient on IV heparin infusion per pharmacy protocol for pulmonary embolism.  Gean Birchwood

## 2017-03-10 NOTE — Evaluation (Signed)
Physical Therapy Evaluation Patient Details Name: Kristie [name] MRN: [id number] DOB: May 07, 1929 Today's Date: 03/10/2017   History of Present Illness  Pt is an 83 y/o female admitted secondary to generalized weakness. Pt found to have CAP. PMH including but not limited to HTN, HLD, dementia and Lumbar laminectomy in 2012.  Clinical Impression  Pt presented supine in bed with HOB elevated, awake and willing to participate in therapy session. Pt's spouse present throughout session. Prior to admission, pt reported that she ambulates with use of rollator and was independent with ADLs. Pt currently requires min guard for safety with bed mobility, transfers and ambulation with RW. Pt would continue to benefit from skilled physical therapy services at this time while admitted and after d/c to address the below listed limitations in order to improve overall safety and independence with functional mobility.     Follow Up Recommendations Home health PT;Supervision/Assistance - 24 hour    Equipment Recommendations  None recommended by PT;Other (comment) (pt has all necessary DME at home)    Recommendations for Other Services       Precautions / Restrictions Precautions Precautions: Fall Restrictions Weight Bearing Restrictions: No      Mobility  Bed Mobility Overal bed mobility: Needs Assistance Bed Mobility: Supine to Sit     Supine to sit: Min guard     General bed mobility comments: increased time and effort, min guard for safety  Transfers Overall transfer level: Needs assistance Equipment used: Rolling walker (2 wheeled) Transfers: Sit to/from Stand Sit to Stand: Min guard         General transfer comment: increased time and effort, good technique, min guard for safety  Ambulation/Gait Ambulation/Gait assistance: Min guard Ambulation Distance (Feet): 40 Feet Assistive device: Rolling walker (2 wheeled) Gait Pattern/deviations: Step-through pattern;Decreased stride  length;Decreased dorsiflexion - right Gait velocity: decreased Gait velocity interpretation: Below normal speed for age/gender General Gait Details: mild instability but no overt LOB or need for physical assistance, pt with chronic R foot drop with R AFO  Stairs            Wheelchair Mobility    Modified Rankin (Stroke Patients Only)       Balance Overall balance assessment: Needs assistance Sitting-balance support: Feet supported Sitting balance-Leahy Scale: Good     Standing balance support: During functional activity;Bilateral upper extremity supported Standing balance-Leahy Scale: Poor Standing balance comment: pt reliant on bilateral UEs on RW                             Pertinent Vitals/Pain Pain Assessment: No/denies pain    Home Living Family/patient expects to be discharged to:: Private residence Living Arrangements: Spouse/significant other Available Help at Discharge: Family;Available 24 hours/day Type of Home: Independent living facility ([facility]) Home Access: Level entry     Home Layout: One level Home Equipment: [place] - single point;Walker - 4 wheels;Wheelchair - manual      Prior Function Level of Independence: Independent with assistive device(s)         Comments: walks with R AFO and rollator     Hand Dominance   Dominant Hand: Right    Extremity/Trunk Assessment   Upper Extremity Assessment Upper Extremity Assessment: Defer to OT evaluation    Lower Extremity Assessment Lower Extremity Assessment: Generalized weakness;RLE deficits/detail RLE Deficits / Details: pt with chronic R foot drop, wears AFO        Communication   Communication:  No difficulties  Cognition Arousal/Alertness: Awake/alert Behavior During Therapy: WFL for tasks assessed/performed Overall Cognitive Status: Within Functional Limits for tasks assessed                                        General Comments       Exercises     Assessment/Plan    PT Assessment Patient needs continued PT services  PT Problem List Decreased balance;Decreased mobility;Decreased coordination;Decreased knowledge of use of DME;Decreased safety awareness       PT Treatment Interventions DME instruction;Gait training;Stair training;Functional mobility training;Therapeutic activities;Therapeutic exercise;Balance training;Neuromuscular re-education;Patient/family education    PT Goals (Current goals can be found in the Care Plan section)  Acute Rehab PT Goals Patient Stated Goal: return home PT Goal Formulation: With patient/family Time For Goal Achievement: 03/24/17 Potential to Achieve Goals: Good    Frequency Min 3X/week   Barriers to discharge        Co-evaluation               AM-PAC PT "6 Clicks" Daily Activity  Outcome Measure Difficulty turning over in bed (including adjusting bedclothes, sheets and blankets)?: A Little Difficulty moving from lying on back to sitting on the side of the bed? : A Little Difficulty sitting down on and standing up from a chair with arms (e.g., wheelchair, bedside commode, etc,.)?: Total Help needed moving to and from a bed to chair (including a wheelchair)?: A Little Help needed walking in hospital room?: A Little Help needed climbing 3-5 steps with a railing? : A Little 6 Click Score: 16    End of Session Equipment Utilized During Treatment: Gait belt Activity Tolerance: Patient tolerated treatment well Patient left: in chair;with call bell/phone within reach;with chair alarm set;with family/visitor present Nurse Communication: Mobility status PT Visit Diagnosis: Unsteadiness on feet (R26.81);Other abnormalities of gait and mobility (R26.89)    Time: 1110-1145 PT Time Calculation (min) (ACUTE ONLY): 35 min   Charges:   PT Evaluation $PT Eval Moderate Complexity: 1 Mod PT Treatments $Gait Training: 8-22 mins   PT G Codes:   PT G-Codes **NOT FOR INPATIENT  CLASS** Functional Assessment Tool Used: AM-PAC 6 Clicks Basic Mobility;Clinical judgement Functional Limitation: Mobility: Walking and moving around Mobility: Walking and Moving Around Current Status (F7510): At least 40 percent but less than 60 percent impaired, limited or restricted Mobility: Walking and Moving Around Goal Status [phone]): At least 1 percent but less than 20 percent impaired, limited or restricted    [facility], [place], DPT [place] 03/10/2017, 12:01 PM

## 2017-03-10 NOTE — Progress Notes (Addendum)
ANTICOAGULATION CONSULT NOTE - Initial Consult  Pharmacy Consult for heparin dosing Indication: pulmonary embolus  Allergies  Allergen Reactions  . Restoril [Temazepam]     Nightmares  . Bactrim [Sulfamethoxazole-Trimethoprim] Nausea And Vomiting    Patient Measurements: Height: 5\' 8"  (172.7 cm) Weight: 175 lb (79.4 kg) IBW/kg (Calculated) : 63.9 Heparin Dosing Weight: 79.4 kg  Vital Signs: Temp: 99.4 F (37.4 C) (08/16 2100) Temp Source: Oral (08/16 2100) BP: 138/65 (08/16 2100) Pulse Rate: 81 (08/16 2100)  Labs:  Recent Labs  03/09/17 1150 03/10/17 0432  HGB 12.8 12.2  HCT 40.0 38.4  PLT 184 180  CREATININE 0.73 0.67    Estimated Creatinine Clearance: 55.9 mL/min (by C-G formula based on SCr of 0.67 mg/dL).   Medical History: Past Medical History:  Diagnosis Date  . Cataract   . Fibromyalgia 04/25/2014  . Hyperlipidemia   . Hypertension   . Lesion of sciatic nerve   . Low back pain   . Memory loss   . Spinal stenosis of lumbar region 04/25/2014  . Tremor   . Weakness     Medications:  Scheduled:  . amLODipine  10 mg Oral Daily  . aspirin  81 mg Oral Daily  . darifenacin  15 mg Oral Daily  . donepezil  10 mg Oral BID  . gabapentin  800 mg Oral TID  . guaiFENesin  1,200 mg Oral BID  . losartan  50 mg Oral Daily  . metoprolol tartrate  25 mg Oral BID  . potassium chloride  40 mEq Oral BID  . sertraline  50 mg Oral Daily  . simvastatin  10 mg Oral QODAY  . zolpidem  5 mg Oral QHS   Infusions:  . sodium chloride 75 mL/hr at 03/10/17 0959  . azithromycin Stopped (03/10/17 2030)  . cefTRIAXone (ROCEPHIN)  IV Stopped (03/10/17 1828)    Assessment: Kristie [name] is a 73 YOF who presented to the ED on 8/15 with generalized weakness and SOB consistent with possible PE. Patient was started on [place] heparin with last dose today at 1400. Tonight's CAT scan revealed middle lobe PE. Pharmacy has been asked to dose IV heparin.  Goal of Therapy:  Heparin level  0.3-0.7 units/ml Monitor platelets by anticoagulation protocol: Yes   Plan:  Give 5000 units heparin bolus x 1 Start heparin infusion at 1350 units/hr Check anti-Xa level in 6 hours and daily while on heparin Continue to monitor H&H and platelets  Amy Dorszynski 03/10/2017,9:38 PM   I discussed / reviewed the pharmacy note by Ms. Dorszynski, Pharm.D Candidate and I agree with their findings and plans as documented.  [employer], Pharm.D., BCPS Clinical Pharmacist 03/10/2017 10:02 PM

## 2017-03-10 NOTE — Progress Notes (Signed)
PROGRESS NOTE    Kristie [name]  HQP:[id number] DOB: 02/19/1929 DOA: 03/09/2017 PCP: Blanchie Serve, MD   Brief Narrative:  Kristie [name] is a 83 y.o. female with medical history significant of her mouth, hypokalemia, hypertension, chronic sciatica, memory loss, tremor. Patient reports a several-day history of gradual progressive weakness and fatigue. Associated with intermittent cough. Patient endorses fever which started on the day of admission as high as 101. Patient was started on Macrobid for recurrent UTI by her PCP on 03/04/2017. Patient denies dysuria, frequency, abdominal pain, chest pain, palpitations, nausea, vomiting. Patient has endorsed decreased oral intake over this period of time. Was admitted for Generalized Weakness, ?CAP, and Suspected UTI. Repeating Urinalysis and Urine Cx and obtaining CT Chest because of Left Apical Density.  Assessment & Plan:   Active Problems:   Essential hypertension   Generalized weakness   Fibromyalgia syndrome   Recurrent UTI   CAP (community acquired pneumonia)  Generalized weakness:  - Pt was unable to ambulate safely due to weakness on Admission. Likely due to poor physical reserve at basesline in the setting of poor oral intake and acute infectious process.  - PT/OT recommending Home Health PT - IVF with NS at 75 mL/hr - Nutrition Evaluation   Possible CAP:  - Pt presented w/ fevers and cough and is clinically dehydrated.  - CXR w/o evidence of acute infectious process but did show There is focal left apical opacity which may reflect a pulmonary nodule versus artifact resulting from the adjacent first ribs. There is no other focal parenchymal opacity. There is no pleural effusion or pneumothorax. The heart and mediastinal contours are unremarkable. - CTX and Azithro started in ED and will continue - Added Guaifenesin 1200 mg po BID  - Repeat CXR this AM showed Persistent LEFT apical density. Recommend CT thorax to exclude pulmonary nodule  versus apical scarring. - Procalcitonin was 0.10 - Influenza A/B PCR Negative - Respiratory Virus Panel Negative - Obtain CT Chest w/ Contrast to evaluate Left Density   Recurrent/chronic UTI:  -UA not overly impressive and contaminated with 6-30 squamous epithelial. On Hiprex chronically. - Discontinued outpatient Macrobid as patient being treated with IV Ceftriaxone - Follow-up on urine culture; Urine Cx showed Multiple Species Present - Repeat Urinalysis and Urine Cx - Continue Vesicare with Pharmacy Substitution of Darifenacin 15 mg po daily - Hold Hiprex - Follow up on Repeat Cx's   HTN: - C/w Amlodipine 10 mg po Daily and C/w Losartan 50 mg po Daily - C/w Metoprolol 25 mg po BID   HLD: - Continue Simvastatin 10 mg po EOD  Chronic Pain: - Cotninue Gabapentin 800 mg po TID - C/w Diclofenac Sodium TD Gel Topically 4 times Daily for MSK Pain  Dementia: - Continue Donepezil 10 mg po BID  Depression: - Continue Sertraline 50 mg po Daily  Insomnia: - Continue Ambien 5 mg po qHS; Philis Fendt as an outpatient   Hypokalemia - Patient's K+ was 3.3 - Replete with po KCl 40 mEQ BID x 2 doses - Continue to Monitor and Replete as Necessary - Repeat CMP in AM  DVT prophylaxis: Heparin 5000 units sq q8h Code Status: FULL CODE Family Communication: Discussed with Husband at bedside Disposition Plan: [place] [facility]  Consultants:   None   Procedures: None   Antimicrobials: Anti-infectives    Start     Dose/Rate Route Frequency Ordered Stop   03/10/17 1700  cefTRIAXone (ROCEPHIN) 1 g in dextrose 5 % 50 mL IVPB  1 g 100 mL/hr over 30 Minutes Intravenous Every 24 hours 03/09/17 1753 03/16/17 1659   03/10/17 1600  azithromycin (ZITHROMAX) 500 mg in dextrose 5 % 250 mL IVPB     500 mg 250 mL/hr over 60 Minutes Intravenous Every 24 hours 03/09/17 1753 03/16/17 1559   03/09/17 1500  azithromycin (ZITHROMAX) 500 mg in dextrose 5 % 250 mL IVPB     500 mg 250  mL/hr over 60 Minutes Intravenous  Once 03/09/17 1445 03/09/17 1704   03/09/17 1445  cefTRIAXone (ROCEPHIN) 1 g in dextrose 5 % 50 mL IVPB     1 g 100 mL/hr over 30 Minutes Intravenous  Once 03/09/17 1445 03/09/17 1620     Subjective: Seen and examined at bedside and stated she felt worse than coming in. Has had increased coughing. No Nausea or vomiting but still feels weak. No CP and denied SOB. No other complaints or concerns at this time.   Objective: Vitals:   03/10/17 0510 03/10/17 0936 03/10/17 1330 03/10/17 1757  BP: 124/72 121/65 125/70 (!) 142/78  Pulse: 77 93 77 84  Resp: 15 18 18 18   Temp: 98.5 F (36.9 C) 99 F (37.2 C) 98.4 F (36.9 C) 99.4 F (37.4 C)  TempSrc: Oral Oral Oral Oral  SpO2: 94% 96%  94%  Weight:      Height:        Intake/Output Summary (Last 24 hours) at 03/10/17 1839 Last data filed at 03/10/17 1224  Gross per 24 hour  Intake             1540 ml  Output              700 ml  Net              840 ml   Filed Weights   03/09/17 1043  Weight: 79.4 kg (175 lb)   Examination: Physical Exam:  Constitutional: WN/WD elderly Caucasian female in  NAD and appears calm and comfortable Eyes: Lids and conjunctivae normal, sclerae anicteric  ENMT: External Ears, Nose appear normal. Grossly normal hearing. Mucous membranes are moist Neck: Appears normal, supple, no cervical masses, normal ROM, no appreciable thyromegaly, no JVD Respiratory: Diminished to auscultation bilaterally, no wheezing, rales, rhonchi or crackles. Normal respiratory effort and patient is not tachypenic. No accessory muscle use.  Cardiovascular: RRR, 2/6 Systolic murmur appreciated. S1 and S2 auscultated. Mild extremity edema.  Abdomen: Soft, non-tender, non-distended. No masses palpated. No appreciable hepatosplenomegaly. Bowel sounds positive.  GU: Deferred. Musculoskeletal: No clubbing / cyanosis of digits/nails. No joint deformity upper and lower extremities. No contractures Skin:  No rashes, lesions, ulcers on a limited skin eval. No induration; Warm and dry.  Neurologic: CN 2-12 grossly intact with no focal deficits. Sensation intact in all 4 Extremities. Romberg sign cerebellar reflexes not assessed.  Psychiatric: Normal judgment and insight. Alert and oriented x 3. Normal mood and appropriate affect.   Data Reviewed: I have personally reviewed following labs and imaging studies  CBC:  Recent Labs Lab 03/09/17 1150 03/10/17 0432  WBC 10.0 8.0  NEUTROABS 8.2*  --   HGB 12.8 12.2  HCT 40.0 38.4  MCV 95.0 96.5  PLT 184 825   Basic Metabolic Panel:  Recent Labs Lab 03/09/17 1150 03/09/17 1849 03/10/17 0432  NA 141  --  141  K 3.9  --  3.3*  CL 107  --  107  CO2 24  --  27  GLUCOSE 127*  --  104*  BUN 12  --  10  CREATININE 0.73  --  0.67  CALCIUM 8.6*  --  8.5*  MG  --  2.2  --   PHOS  --  3.0  --    GFR: Estimated Creatinine Clearance: 55.9 mL/min (by C-G formula based on SCr of 0.67 mg/dL). Liver Function Tests: No results for input(s): AST, ALT, ALKPHOS, BILITOT, PROT, ALBUMIN in the last 168 hours. No results for input(s): LIPASE, AMYLASE in the last 168 hours. No results for input(s): AMMONIA in the last 168 hours. Coagulation Profile: No results for input(s): INR, PROTIME in the last 168 hours. Cardiac Enzymes: No results for input(s): CKTOTAL, CKMB, CKMBINDEX, TROPONINI in the last 168 hours. BNP (last 3 results) No results for input(s): PROBNP in the last 8760 hours. HbA1C: No results for input(s): HGBA1C in the last 72 hours. CBG: No results for input(s): GLUCAP in the last 168 hours. Lipid Profile: No results for input(s): CHOL, HDL, LDLCALC, TRIG, CHOLHDL, LDLDIRECT in the last 72 hours. Thyroid Function Tests: No results for input(s): TSH, T4TOTAL, FREET4, T3FREE, THYROIDAB in the last 72 hours. Anemia Panel: No results for input(s): VITAMINB12, FOLATE, FERRITIN, TIBC, IRON, RETICCTPCT in the last 72 hours. Sepsis  Labs:  Recent Labs Lab 03/09/17 1208 03/09/17 1849  PROCALCITON  --  0.10  LATICACIDVEN 1.49  --     Recent Results (from the past 240 hour(s))  Urine culture     Status: Abnormal   Collection Time: 03/09/17 12:30 PM  Result Value Ref Range Status   Specimen Description URINE, RANDOM  Final   Special Requests NONE  Final   Culture MULTIPLE SPECIES PRESENT, SUGGEST RECOLLECTION (A)  Final   Report Status 03/10/2017 FINAL  Final  Respiratory Panel by PCR     Status: None   Collection Time: 03/10/17  1:30 PM  Result Value Ref Range Status   Adenovirus NOT DETECTED NOT DETECTED Final   Coronavirus 229E NOT DETECTED NOT DETECTED Final   Coronavirus HKU1 NOT DETECTED NOT DETECTED Final   Coronavirus NL63 NOT DETECTED NOT DETECTED Final   Coronavirus OC43 NOT DETECTED NOT DETECTED Final   Metapneumovirus NOT DETECTED NOT DETECTED Final   Rhinovirus / Enterovirus NOT DETECTED NOT DETECTED Final   Influenza A NOT DETECTED NOT DETECTED Final   Influenza A H1 NOT DETECTED NOT DETECTED Final   Influenza A H1 2009 NOT DETECTED NOT DETECTED Final   Influenza A H3 NOT DETECTED NOT DETECTED Final   Influenza B NOT DETECTED NOT DETECTED Final   Parainfluenza Virus 1 NOT DETECTED NOT DETECTED Final   Parainfluenza Virus 2 NOT DETECTED NOT DETECTED Final   Parainfluenza Virus 3 NOT DETECTED NOT DETECTED Final   Parainfluenza Virus 4 NOT DETECTED NOT DETECTED Final   Respiratory Syncytial Virus NOT DETECTED NOT DETECTED Final   Bordetella pertussis NOT DETECTED NOT DETECTED Final   Chlamydophila pneumoniae NOT DETECTED NOT DETECTED Final   Mycoplasma pneumoniae NOT DETECTED NOT DETECTED Final    Radiology Studies: Dg Chest 2 View  Result Date: 03/10/2017 CLINICAL DATA:  Cough, short of breath EXAM: CHEST  2 VIEW COMPARISON:  03/09/2017 FINDINGS: Normal cardiac silhouette with ectatic aorta. The LEFT apical density is less pronounced but persistent and asymmetric with the RIGHT side. Lung  bases are clear. No pulmonary edema. No osseous abnormality. IMPRESSION: Persistent LEFT apical density. Recommend CT thorax to exclude pulmonary nodule versus apical scarring. Electronically Signed   By: Suzy Bouchard M.D.   On: 03/10/2017  08:11   Dg Chest 2 View  Result Date: 03/09/2017 CLINICAL DATA:  Fever, weakness for 2 days EXAM: CHEST  2 VIEW COMPARISON:  09/12/2016 FINDINGS: There is focal left apical opacity which may reflect a pulmonary nodule versus artifact resulting from the adjacent first ribs. There is no other focal parenchymal opacity. There is no pleural effusion or pneumothorax. The heart and mediastinal contours are unremarkable. The osseous structures are unremarkable. IMPRESSION: 1. There is focal left apical opacity which may reflect a pulmonary nodule versus artifact resulting from the adjacent first ribs. Recommend apical lordotic PA chest x-ray for further evaluation. Electronically Signed   By: Kathreen Devoid   On: 03/09/2017 14:05   Scheduled Meds: . amLODipine  10 mg Oral Daily  . aspirin  81 mg Oral Daily  . darifenacin  15 mg Oral Daily  . donepezil  10 mg Oral BID  . gabapentin  800 mg Oral TID  . guaiFENesin  1,200 mg Oral BID  . heparin  5,000 Units Subcutaneous Q8H  . losartan  50 mg Oral Daily  . metoprolol tartrate  25 mg Oral BID  . potassium chloride  40 mEq Oral BID  . sertraline  50 mg Oral Daily  . simvastatin  10 mg Oral QODAY  . zolpidem  5 mg Oral QHS   Continuous Infusions: . sodium chloride 75 mL/hr at 03/10/17 0959  . azithromycin 500 mg (03/10/17 1800)  . cefTRIAXone (ROCEPHIN)  IV 1 g (03/10/17 1758)    LOS: 0 days   Kerney Elbe, DO [facility] Pager [phone]  If 7PM-7AM, please contact night-coverage [email or web address] Password [facility] 03/10/2017, 6:39 PM

## 2017-03-11 ENCOUNTER — Observation Stay (HOSPITAL_COMMUNITY): Payer: Medicare Other

## 2017-03-11 DIAGNOSIS — I2699 Other pulmonary embolism without acute cor pulmonale: Secondary | ICD-10-CM

## 2017-03-11 DIAGNOSIS — I071 Rheumatic tricuspid insufficiency: Secondary | ICD-10-CM | POA: Diagnosis present

## 2017-03-11 DIAGNOSIS — N390 Urinary tract infection, site not specified: Secondary | ICD-10-CM | POA: Diagnosis present

## 2017-03-11 DIAGNOSIS — Z881 Allergy status to other antibiotic agents status: Secondary | ICD-10-CM | POA: Diagnosis not present

## 2017-03-11 DIAGNOSIS — I361 Nonrheumatic tricuspid (valve) insufficiency: Secondary | ICD-10-CM | POA: Diagnosis not present

## 2017-03-11 DIAGNOSIS — E860 Dehydration: Secondary | ICD-10-CM | POA: Diagnosis present

## 2017-03-11 DIAGNOSIS — F5101 Primary insomnia: Secondary | ICD-10-CM | POA: Diagnosis not present

## 2017-03-11 DIAGNOSIS — Z79899 Other long term (current) drug therapy: Secondary | ICD-10-CM | POA: Diagnosis not present

## 2017-03-11 DIAGNOSIS — M5430 Sciatica, unspecified side: Secondary | ICD-10-CM | POA: Diagnosis present

## 2017-03-11 DIAGNOSIS — G8929 Other chronic pain: Secondary | ICD-10-CM | POA: Diagnosis present

## 2017-03-11 DIAGNOSIS — E876 Hypokalemia: Secondary | ICD-10-CM | POA: Diagnosis present

## 2017-03-11 DIAGNOSIS — Y92009 Unspecified place in unspecified non-institutional (private) residence as the place of occurrence of the external cause: Secondary | ICD-10-CM | POA: Diagnosis not present

## 2017-03-11 DIAGNOSIS — J181 Lobar pneumonia, unspecified organism: Secondary | ICD-10-CM

## 2017-03-11 DIAGNOSIS — D491 Neoplasm of unspecified behavior of respiratory system: Secondary | ICD-10-CM | POA: Diagnosis not present

## 2017-03-11 DIAGNOSIS — F0390 Unspecified dementia without behavioral disturbance: Secondary | ICD-10-CM | POA: Diagnosis present

## 2017-03-11 DIAGNOSIS — R531 Weakness: Secondary | ICD-10-CM | POA: Diagnosis present

## 2017-03-11 DIAGNOSIS — W19XXXA Unspecified fall, initial encounter: Secondary | ICD-10-CM | POA: Diagnosis present

## 2017-03-11 DIAGNOSIS — Z803 Family history of malignant neoplasm of breast: Secondary | ICD-10-CM | POA: Diagnosis not present

## 2017-03-11 DIAGNOSIS — N289 Disorder of kidney and ureter, unspecified: Secondary | ICD-10-CM | POA: Diagnosis present

## 2017-03-11 DIAGNOSIS — I340 Nonrheumatic mitral (valve) insufficiency: Secondary | ICD-10-CM

## 2017-03-11 DIAGNOSIS — Z888 Allergy status to other drugs, medicaments and biological substances status: Secondary | ICD-10-CM | POA: Diagnosis not present

## 2017-03-11 DIAGNOSIS — R911 Solitary pulmonary nodule: Secondary | ICD-10-CM | POA: Diagnosis present

## 2017-03-11 DIAGNOSIS — M797 Fibromyalgia: Secondary | ICD-10-CM | POA: Diagnosis present

## 2017-03-11 DIAGNOSIS — G4700 Insomnia, unspecified: Secondary | ICD-10-CM | POA: Diagnosis present

## 2017-03-11 DIAGNOSIS — E784 Other hyperlipidemia: Secondary | ICD-10-CM

## 2017-03-11 DIAGNOSIS — Z87440 Personal history of urinary (tract) infections: Secondary | ICD-10-CM | POA: Diagnosis not present

## 2017-03-11 DIAGNOSIS — N6320 Unspecified lump in the left breast, unspecified quadrant: Secondary | ICD-10-CM | POA: Diagnosis present

## 2017-03-11 DIAGNOSIS — E785 Hyperlipidemia, unspecified: Secondary | ICD-10-CM | POA: Diagnosis present

## 2017-03-11 DIAGNOSIS — Z7982 Long term (current) use of aspirin: Secondary | ICD-10-CM | POA: Diagnosis not present

## 2017-03-11 DIAGNOSIS — F329 Major depressive disorder, single episode, unspecified: Secondary | ICD-10-CM | POA: Diagnosis present

## 2017-03-11 DIAGNOSIS — I10 Essential (primary) hypertension: Secondary | ICD-10-CM | POA: Diagnosis present

## 2017-03-11 DIAGNOSIS — Z87891 Personal history of nicotine dependence: Secondary | ICD-10-CM | POA: Diagnosis not present

## 2017-03-11 DIAGNOSIS — J189 Pneumonia, unspecified organism: Secondary | ICD-10-CM | POA: Diagnosis present

## 2017-03-11 LAB — CBC WITH DIFFERENTIAL/PLATELET
Basophils Absolute: 0 10*3/uL (ref 0.0–0.1)
Basophils Relative: 0 %
EOS PCT: 5 %
Eosinophils Absolute: 0.3 10*3/uL (ref 0.0–0.7)
HCT: 39.1 % (ref 36.0–46.0)
Hemoglobin: 12.6 g/dL (ref 12.0–15.0)
LYMPHS ABS: 1.5 10*3/uL (ref 0.7–4.0)
LYMPHS PCT: 21 %
MCH: 30.4 pg (ref 26.0–34.0)
MCHC: 32.2 g/dL (ref 30.0–36.0)
MCV: 94.2 fL (ref 78.0–100.0)
MONOS PCT: 11 %
Monocytes Absolute: 0.8 10*3/uL (ref 0.1–1.0)
Neutro Abs: 4.6 10*3/uL (ref 1.7–7.7)
Neutrophils Relative %: 63 %
PLATELETS: 185 10*3/uL (ref 150–400)
RBC: 4.15 MIL/uL (ref 3.87–5.11)
RDW: 14.1 % (ref 11.5–15.5)
WBC: 7.2 10*3/uL (ref 4.0–10.5)

## 2017-03-11 LAB — EXPECTORATED SPUTUM ASSESSMENT W REFEX TO RESP CULTURE

## 2017-03-11 LAB — ECHOCARDIOGRAM COMPLETE
Ao-asc: 33 cm
E decel time: 254 msec
FS: 26 % — AB (ref 28–44)
Height: 68 in
IVS/LV PW RATIO, ED: 1
LA ID, A-P, ES: 39 mm
LA diam end sys: 39 mm
LA vol A4C: 48.9 ml
LADIAMINDEX: 1.98 cm/m2
LDCA: 2.54 cm2
LV TDI E'LATERAL: 9.36
LV e' LATERAL: 9.36 cm/s
LVOTD: 18 mm
Lateral S' vel: 11.9 cm/s
MV Dec: 254
MV pk E vel: 0.8 m/s
PW: 11 mm — AB (ref 0.6–1.1)
Reg peak vel: 281 cm/s
TAPSE: 24.2 mm
TDI e' medial: 7.72
TRMAXVEL: 281 cm/s
Weight: 2800 oz

## 2017-03-11 LAB — HEPARIN LEVEL (UNFRACTIONATED)
Heparin Unfractionated: 0.83 IU/mL — ABNORMAL HIGH (ref 0.30–0.70)
Heparin Unfractionated: 0.59 IU/mL (ref 0.30–0.70)
Heparin Unfractionated: 0.44 IU/mL (ref 0.30–0.70)

## 2017-03-11 LAB — URINE CULTURE: Culture: NO GROWTH

## 2017-03-11 LAB — COMPREHENSIVE METABOLIC PANEL
ALK PHOS: 47 U/L (ref 38–126)
ALT: 25 U/L (ref 14–54)
AST: 24 U/L (ref 15–41)
Albumin: 2.7 g/dL — ABNORMAL LOW (ref 3.5–5.0)
Anion gap: 7 (ref 5–15)
BUN: 5 mg/dL — ABNORMAL LOW (ref 6–20)
CALCIUM: 8.5 mg/dL — AB (ref 8.9–10.3)
CO2: 26 mmol/L (ref 22–32)
CREATININE: 0.68 mg/dL (ref 0.44–1.00)
Chloride: 109 mmol/L (ref 101–111)
Glucose, Bld: 96 mg/dL (ref 65–99)
Potassium: 3.8 mmol/L (ref 3.5–5.1)
Sodium: 142 mmol/L (ref 135–145)
Total Bilirubin: 0.5 mg/dL (ref 0.3–1.2)
Total Protein: 5.5 g/dL — ABNORMAL LOW (ref 6.5–8.1)

## 2017-03-11 LAB — LEGIONELLA PNEUMOPHILA SEROGP 1 UR AG: L. PNEUMOPHILA SEROGP 1 UR AG: NEGATIVE

## 2017-03-11 LAB — PROCALCITONIN: Procalcitonin: <0.1 ng/mL

## 2017-03-11 LAB — PHOSPHORUS: PHOSPHORUS: 2.9 mg/dL (ref 2.5–4.6)

## 2017-03-11 LAB — MAGNESIUM: MAGNESIUM: 2 mg/dL (ref 1.7–2.4)

## 2017-03-11 LAB — EXPECTORATED SPUTUM ASSESSMENT W GRAM STAIN, RFLX TO RESP C

## 2017-03-11 MED ORDER — PERFLUTREN LIPID MICROSPHERE
1.0000 mL | INTRAVENOUS | Status: AC | PRN
  Filled 2017-03-11 (×2): qty 10

## 2017-03-11 NOTE — Progress Notes (Signed)
ANTICOAGULATION CONSULT NOTE -Follow up  Pharmacy Consult for heparin dosing Indication: pulmonary embolus  Allergies  Allergen Reactions  . Restoril [Temazepam]     Nightmares  . Bactrim [Sulfamethoxazole-Trimethoprim] Nausea And Vomiting    Patient Measurements: Height: 5\' 8"  (172.7 cm) Weight: 175 lb (79.4 kg) IBW/kg (Calculated) : 63.9 Heparin Dosing Weight = TBW = 79.4 kg  Vital Signs: Temp: 98.2 F (36.8 C) (08/17 1417) Temp Source: Oral (08/17 1417) BP: 120/61 (08/17 1417) Pulse Rate: 78 (08/17 1417)  Labs:  Recent Labs  03/09/17 1150 03/10/17 0432 03/11/17 0517 03/11/17 1344  HGB 12.8 12.2 12.6  --   HCT 40.0 38.4 39.1  --   PLT 184 180 185  --   HEPARINUNFRC  --   --  0.83* 0.59  CREATININE 0.73 0.67 0.68  --     Estimated Creatinine Clearance: 55.9 mL/min (by C-G formula based on SCr of 0.68 mg/dL).   Medical History: Past Medical History:  Diagnosis Date  . Cataract   . Fibromyalgia 04/25/2014  . Hyperlipidemia   . Hypertension   . Lesion of sciatic nerve   . Low back pain   . Memory loss   . Spinal stenosis of lumbar region 04/25/2014  . Tremor   . Weakness     Medications:  Scheduled:  . amLODipine  10 mg Oral Daily  . aspirin  81 mg Oral Daily  . darifenacin  15 mg Oral Daily  . donepezil  10 mg Oral BID  . gabapentin  800 mg Oral TID  . guaiFENesin  1,200 mg Oral BID  . losartan  50 mg Oral Daily  . metoprolol tartrate  25 mg Oral BID  . sertraline  50 mg Oral Daily  . simvastatin  10 mg Oral QODAY  . zolpidem  5 mg Oral QHS   Infusions:  . sodium chloride 75 mL/hr at 03/11/17 0500  . azithromycin Stopped (03/10/17 2030)  . cefTRIAXone (ROCEPHIN)  IV Stopped (03/10/17 1828)  . heparin 1,200 Units/hr (03/11/17 8119)    Assessment: Ms. Rowe is a 83 YO female on heparin infusion for PE. Heparin level = 0.59,  therapeutic x1 on 1200 units/hr.  CBC wnl. No bleeding noted.  Goal of Therapy:  Heparin level 0.3-0.7  units/ml Monitor platelets by anticoagulation protocol: Yes   Plan:  Continue heparin infusion 1200 units/hr Check anti-Xa level in 6 hours to confirm therapeutic Then daily HL while on heparin Continue to monitor H&H and platelets daily  Nicole Cella, RPh Clinical Pharmacist Pager: [phone] 03/11/2017,3:12 PM

## 2017-03-11 NOTE — Progress Notes (Signed)
ANTICOAGULATION CONSULT NOTE - Follow Up Consult  Pharmacy Consult for Heparin  Indication: pulmonary embolus  Allergies  Allergen Reactions  . Restoril [Temazepam]     Nightmares  . Bactrim [Sulfamethoxazole-Trimethoprim] Nausea And Vomiting    Patient Measurements: Height: 5\' 8"  (172.7 cm) Weight: 175 lb (79.4 kg) IBW/kg (Calculated) : 63.9  Vital Signs: Temp: 99.2 F (37.3 C) (08/17 0502) Temp Source: Oral (08/17 0502) BP: 137/74 (08/17 0502) Pulse Rate: 87 (08/17 0502)  Labs:  Recent Labs  03/09/17 1150 03/10/17 0432 03/11/17 0517  HGB 12.8 12.2  --   HCT 40.0 38.4  --   PLT 184 180  --   HEPARINUNFRC  --   --  0.83*  CREATININE 0.73 0.67 0.68    Estimated Creatinine Clearance: 55.9 mL/min (by C-G formula based on SCr of 0.68 mg/dL).   Assessment: Heparin for new onset PE, initial heparin level is elevated, no issues per RN.   Goal of Therapy:  Heparin level 0.3-0.7 units/ml Monitor platelets by anticoagulation protocol: Yes   Plan:  -Dec heparin to 1200 units/hr -1400 HL  Ryland Tungate 03/11/2017,6:55 AM

## 2017-03-11 NOTE — Care Management Note (Signed)
Case Management Note  Patient Details  Name: Kristie [name] MRN: [id number] Date of Birth: November 07, 1928  Subjective/Objective:   Pt admitted on 03/09/17 with CAP, now with pulmonary embolus.  PTA, pt resides at [place] with her spouse.                  Action/Plan: PT recommending HHPT at dc.  Pt states she has had PT follow up at [facility] in the past.   Spoke with admissions coordinator Gerilyn Pilgrim at [facility], she states facility is contracted with [facility], and orders for home therapies need to be faxed to [phone] upon dc.  Case manager will follow up.  MD, please leave order for HHPT prior to dc.     Expected Discharge Date:                  Expected Discharge Plan:  [place]  In-House Referral:     Discharge planning Services  CM Consult  Post Acute Care Choice:    Choice offered to:     DME Arranged:    DME Agency:     HH Arranged:    [place] Agency:     Status of Service:  In process, will continue to follow  If discussed at Long Length of Stay Meetings, dates discussed:    Additional Comments:  Ella Bodo, RN 03/11/2017, 2:39 PM

## 2017-03-11 NOTE — Progress Notes (Signed)
  Echocardiogram 2D Echocardiogram has been performed.  Kristie [name] 03/11/2017, 4:19 PM

## 2017-03-11 NOTE — Progress Notes (Signed)
ANTICOAGULATION CONSULT NOTE -Follow up  Pharmacy Consult for heparin dosing Indication: pulmonary embolus  Allergies  Allergen Reactions  . Restoril [Temazepam]     Nightmares  . Bactrim [Sulfamethoxazole-Trimethoprim] Nausea And Vomiting    Patient Measurements: Height: 5\' 8"  (172.7 cm) Weight: 175 lb (79.4 kg) IBW/kg (Calculated) : 63.9 Heparin Dosing Weight = TBW = 79.4 kg  Vital Signs: Temp: 98.9 F (37.2 C) (08/17 2042) Temp Source: Oral (08/17 2042) BP: 129/67 (08/17 2042) Pulse Rate: 78 (08/17 2042)  Labs:  Recent Labs  03/09/17 1150 03/10/17 0432 03/11/17 0517 03/11/17 1344 03/11/17 2015  HGB 12.8 12.2 12.6  --   --   HCT 40.0 38.4 39.1  --   --   PLT 184 180 185  --   --   HEPARINUNFRC  --   --  0.83* 0.59 0.44  CREATININE 0.73 0.67 0.68  --   --     Estimated Creatinine Clearance: 55.9 mL/min (by C-G formula based on SCr of 0.68 mg/dL).   Medical History: Past Medical History:  Diagnosis Date  . Cataract   . Fibromyalgia 04/25/2014  . Hyperlipidemia   . Hypertension   . Lesion of sciatic nerve   . Low back pain   . Memory loss   . Spinal stenosis of lumbar region 04/25/2014  . Tremor   . Weakness     Medications:  Scheduled:  . amLODipine  10 mg Oral Daily  . aspirin  81 mg Oral Daily  . darifenacin  15 mg Oral Daily  . donepezil  10 mg Oral BID  . gabapentin  800 mg Oral TID  . guaiFENesin  1,200 mg Oral BID  . losartan  50 mg Oral Daily  . metoprolol tartrate  25 mg Oral BID  . sertraline  50 mg Oral Daily  . simvastatin  10 mg Oral QODAY  . zolpidem  5 mg Oral QHS   Infusions:  . sodium chloride 75 mL/hr at 03/11/17 2152  . azithromycin Stopped (03/11/17 1928)  . cefTRIAXone (ROCEPHIN)  IV Stopped (03/11/17 1929)  . heparin 1,200 Units/hr (03/11/17 7829)    Assessment: Ms. Mickelson is a 83 YO female on heparin infusion for PE. Heparin level remains therapeutic on 1200 units/hr.  CBC wnl. No bleeding noted.  Goal of Therapy:   Heparin level 0.3-0.7 units/ml Monitor platelets by anticoagulation protocol: Yes   Plan:  Continue heparin infusion 1200 units/hr Daily heparin level and CBC while on heparin Follow-up plans for oral anticoagulation  Farin Buhman, Pharm.D., BCPS Clinical Pharmacist 03/11/2017 10:07 PM

## 2017-03-11 NOTE — Progress Notes (Signed)
Nutrition Brief Note  Received MD consult for assessment of nutrition status. Chart reviewed. 5% weight loss within 3 months is not significant for the time frame.  Per discussion with RN, patient is eating very well and has no nutrition concerns. Unable to speak with patient as she was having a procedure done in her room.   Body mass index is 26.61 kg/m. Patient meets criteria for overweight based on current BMI.   Current diet order is Regular, patient is consuming approximately 100% of meals at this time. Labs and medications reviewed.   No nutrition interventions warranted at this time. If nutrition issues arise, please consult RD.   Kristie [name], RD, LDN, [place] Pager [phone] After Hours Pager [phone]

## 2017-03-11 NOTE — Progress Notes (Signed)
*  PRELIMINARY RESULTS* Vascular Ultrasound Bilateral lower extremity venous duplex has been completed.  Preliminary findings: Small segment of deep vein thrombosis in the left peroneal veins, other visualized veins of the lower extremities appear negative for thrombosis. Negative for baker's cysts bilaterally.   Preliminary results called to [facility], patients nurse, @ 16:34  Everrett Coombe 03/11/2017, 4:32 PM

## 2017-03-11 NOTE — Progress Notes (Signed)
PROGRESS NOTE    Kristie [name]  WUJ:[id number] DOB: [birth year] DOA: 03/09/2017 PCP: Blanchie Serve, MD   Brief Narrative:  Kristie [name] is a 83 y.o. female with medical history significant of her mouth, hypokalemia, hypertension, chronic sciatica, memory loss, tremor. Patient reports a several-day history of gradual progressive weakness and fatigue. Associated with intermittent cough. Patient endorses fever which started on the day of admission as high as 101. Patient was started on Macrobid for recurrent UTI by her PCP on 03/04/2017. Patient denies dysuria, frequency, abdominal pain, chest pain, palpitations, nausea, vomiting. Patient has endorsed decreased oral intake over this period of time. Was admitted for Generalized Weakness, ?CAP, and Suspected UTI. Repeated Urinalysis and Urine Cx because was negative.Obtaiend  CT Chest because of Left Apical Density and showed patient possibly had a neoplasm vs. Infection, had a Left Breast mass, and a Left Kidney Neoplasm vs. Complex Cyst. Patient was also found to have a RML PE so Anticoagulation was started. She was worked up for her PE with ECHO and Dopplers. Did not want further workup on other findings mentioned to her on CT Scan.   Assessment & Plan:   Active Problems:   Essential hypertension   HLD (hyperlipidemia)   Hypokalemia   Generalized weakness   Fibromyalgia syndrome   Insomnia   Recurrent UTI   CAP (community acquired pneumonia)   Left breast mass   Kidney lesion, native, left   Pulmonary embolus (HCC)  Generalized Weakness:  - Pt was unable to ambulate safely due to weakness on Admission. Likely due to poor physical reserve at basesline in the setting of poor oral intake and acute infectious process.  - PT/OT recommending Home Health PT - IVF with NS at 75 mL/hr - Nutrition Evaluation appreciated and had no recommendations   Possible CAP/ Neoplasm - Pt presented w/ fevers and cough and is clinically dehydrated.  - CXR w/o  evidence of acute infectious process but did show There is focal left apical opacity which may reflect a pulmonary nodule versus artifact resulting from the adjacent first ribs. There is no other focal parenchymal opacity. There is no pleural effusion or pneumothorax. The heart and mediastinal contours are unremarkable. - CTX and Azithro started in ED and will continue for now - Added Guaifenesin 1200 mg po BID  - Repeat CXR this AM showed Persistent LEFT apical density. Recommend CT thorax to exclude pulmonary nodule versus apical scarring. - Procalcitonin was 0.10 - Influenza A/B PCR Negative - Respiratory Virus Panel Negative - Sputum Cx was not sufficient for testing.  - Obtained CT Chest w/ Contrast to evaluate Left Density and showed Left apical density on radiograph corresponds to masslike consolidative and ground-glass opacity, 3.2 cm. Differential considerations include neoplasm versus pneumonia in the setting of  cough and fever. Workup options include short interval follow-up CT after course of antibiotics to evaluate for any interval change versus PET-CT characterization, however infection may also be positive on PET. Prominent lower paratracheal an AP window nodes.  Left Breast Nodule -2 Cm and is Non-specific -Recommending Mammographic Evaluation to Evaluate for Malignancy -Patient states she does not want to do another Mammogram  Left Upper Kidney Lesion -Indeterminate 17 mm lesion in the upper left kidney see on CT.  -Recommendation was to obtain MRI characterization if patient is able to tolerate to evaluate for complex cyst versus neoplasm. Alternatively, if there are prior exams performed elsewhere, comparison with any prior imaging may be helpful. -Patient Refusing to have Left  Kidney lesion worked up  Acute Right Middle Lobe PE -Seen on CT Scan of Chest with Contrast -Patient started on Anticoagulation with Heparin gtt -Obtained LE Duplex and showed Small segment of deep  vein thrombosis in the left peroneal veins, other visualized veins of the lower extremities appear negative for thrombosis. Negative for baker's cysts bilaterally. -ECHOCardiogram Ordered    Recurrent/chronic UTI:  -UA not overly impressive and contaminated with 6-30 squamous epithelial. On Hiprex chronically. - Discontinued outpatient Macrobid as patient being treated with IV Ceftriaxone - Follow-up on urine culture; Urine Cx showed Multiple Species Present - Repeat Urinalysis and Urine Cx - Continue Vesicare with Pharmacy Substitution of Darifenacin 15 mg po daily - Hold Hiprex - Repeat Cx showed Now Growth  ? Metastatic Disease -Patient does not want further workup and states even if she has cancer will not want chemotherapy or Radiation Treatment  HTN: - C/w Amlodipine 10 mg po Daily and C/w Losartan 50 mg po Daily - C/w Metoprolol 25 mg po BID   HLD: - Continue Simvastatin 10 mg po EOD  Chronic Pain: - Cotninue Gabapentin 800 mg po TID - C/w Diclofenac Sodium TD Gel Topically 4 times Daily for MSK Pain  Dementia: - Continue Donepezil 10 mg po BID  Depression: - Continue Sertraline 50 mg po Daily  Insomnia: - Continue Ambien 5 mg po qHS; Philis Fendt as an outpatient   Hypokalemia, improved - Patient's K+ was 3.3 and improved to 3.8  - Replete with po KCl 40 mEQ BID x 2 doses yesterday  - Continue to Monitor and Replete as Necessary - Repeat CMP in AM  DVT prophylaxis: Heparin 5000 units sq q8h Code Status: FULL CODE Family Communication: Discussed with Husband at bedside Disposition Plan: [facility]  Consultants:   None   Procedures:  Lower Extremity Duplex -Vascular Ultrasound Bilateral lower extremity venous duplex has been completed.  Preliminary findings: Small segment of deep vein thrombosis in the left peroneal veins, other visualized veins of the lower extremities appear negative for thrombosis. Negative for baker's cysts  bilaterally  ECHOCARDIOGRAM ------------------------------------------------------------------- Study Conclusions  - Left ventricle: The cavity size was normal. There was mild   concentric hypertrophy. Systolic function was normal. The   estimated ejection fraction was in the range of 50% to 55%.   Hypokinesis of the inferolateral and inferior myocardium. Doppler   parameters are consistent with abnormal left ventricular   relaxation (grade 1 diastolic dysfunction). Doppler parameters   are consistent with indeterminate ventricular filling pressure. - Aortic valve: There was no regurgitation. - Mitral valve: Transvalvular velocity was within the normal range.   There was no evidence for stenosis. There was mild regurgitation. - Right ventricle: The cavity size was normal. Wall thickness was   normal. Systolic function was normal. - Tricuspid valve: There was mild regurgitation. - Pulmonary arteries: Systolic pressure was within the normal   range. PA peak pressure: 35 mm Hg (S).   Antimicrobials: Anti-infectives    Start     Dose/Rate Route Frequency Ordered Stop   03/10/17 1700  cefTRIAXone (ROCEPHIN) 1 g in dextrose 5 % 50 mL IVPB     1 g 100 mL/hr over 30 Minutes Intravenous Every 24 hours 03/09/17 1753 03/16/17 1659   03/10/17 1600  azithromycin (ZITHROMAX) 500 mg in dextrose 5 % 250 mL IVPB     500 mg 250 mL/hr over 60 Minutes Intravenous Every 24 hours 03/09/17 1753 03/16/17 1559   03/09/17 1500  azithromycin (ZITHROMAX)  500 mg in dextrose 5 % 250 mL IVPB     500 mg 250 mL/hr over 60 Minutes Intravenous  Once 03/09/17 1445 03/09/17 1704   03/09/17 1445  cefTRIAXone (ROCEPHIN) 1 g in dextrose 5 % 50 mL IVPB     1 g 100 mL/hr over 30 Minutes Intravenous  Once 03/09/17 1445 03/09/17 1620     Subjective: Seen and examined at bedside and was surprised that she had a PE. No SOB and states she feels stronger. Does not want work up for findings found on Chest CT. No CP or SOB.  No other complaints or concerns at this time.   Objective: Vitals:   03/11/17 0502 03/11/17 0924 03/11/17 1417 03/11/17 1805  BP: 137/74 128/66 120/61 (!) 154/80  Pulse: 87 93 78 80  Resp: 18 18 18 18   Temp: 99.2 F (37.3 C) 99.4 F (37.4 C) 98.2 F (36.8 C) 98.8 F (37.1 C)  TempSrc: Oral Oral Oral Oral  SpO2: 93% 95% 94% 96%  Weight:      Height:        Intake/Output Summary (Last 24 hours) at 03/11/17 1940 Last data filed at 03/11/17 1700  Gross per 24 hour  Intake              360 ml  Output             3450 ml  Net            -3090 ml   Filed Weights   03/09/17 1043  Weight: 79.4 kg (175 lb)   Examination: Physical Exam:  Constitutional: Pleasant WN/WD Elderly Caucasian female in NAD appears calm and comfortable  Eyes: Sclerae anicteric. Conjunctivae Non-injected ENMT: Grossly normal hearing. External Ears and nose appear normal Neck: Supple with no JVD Respiratory: Diminished but no appreciable wheezing/rales/rhonchi. Patient was not tachypenic or using any accessory muscles to breathe Cardiovascular: RRR; Has 2/6 Systolic Murmur. Mimimal LE edema Abdomen: Soft, NT, ND. Bowel sounds present GU: Deferred Musculoskeletal: No contractures; No cyanosis Skin: Warm and Dry. No rashes or lesions on a limited skin eval Neurologic: CN 2-12 grossly intact. No focal deficits Psychiatric: Pleasant mood and affect. Intact judgement and insight. Awake and Oriented x3.   Data Reviewed: I have personally reviewed following labs and imaging studies  CBC:  Recent Labs Lab 03/09/17 1150 03/10/17 0432 03/11/17 0517  WBC 10.0 8.0 7.2  NEUTROABS 8.2*  --  4.6  HGB 12.8 12.2 12.6  HCT 40.0 38.4 39.1  MCV 95.0 96.5 94.2  PLT 184 180 962   Basic Metabolic Panel:  Recent Labs Lab 03/09/17 1150 03/09/17 1849 03/10/17 0432 03/11/17 0517  NA 141  --  141 142  K 3.9  --  3.3* 3.8  CL 107  --  107 109  CO2 24  --  27 26  GLUCOSE 127*  --  104* 96  BUN 12  --  10 5*   CREATININE 0.73  --  0.67 0.68  CALCIUM 8.6*  --  8.5* 8.5*  MG  --  2.2  --  2.0  PHOS  --  3.0  --  2.9   GFR: Estimated Creatinine Clearance: 55.9 mL/min (by C-G formula based on SCr of 0.68 mg/dL). Liver Function Tests:  Recent Labs Lab 03/11/17 0517  AST 24  ALT 25  ALKPHOS 47  BILITOT 0.5  PROT 5.5*  ALBUMIN 2.7*   No results for input(s): LIPASE, AMYLASE in the last 168 hours. No results  for input(s): AMMONIA in the last 168 hours. Coagulation Profile: No results for input(s): INR, PROTIME in the last 168 hours. Cardiac Enzymes: No results for input(s): CKTOTAL, CKMB, CKMBINDEX, TROPONINI in the last 168 hours. BNP (last 3 results) No results for input(s): PROBNP in the last 8760 hours. HbA1C: No results for input(s): HGBA1C in the last 72 hours. CBG: No results for input(s): GLUCAP in the last 168 hours. Lipid Profile: No results for input(s): CHOL, HDL, LDLCALC, TRIG, CHOLHDL, LDLDIRECT in the last 72 hours. Thyroid Function Tests: No results for input(s): TSH, T4TOTAL, FREET4, T3FREE, THYROIDAB in the last 72 hours. Anemia Panel: No results for input(s): VITAMINB12, FOLATE, FERRITIN, TIBC, IRON, RETICCTPCT in the last 72 hours. Sepsis Labs:  Recent Labs Lab 03/09/17 1208 03/09/17 1849 03/11/17 0517  PROCALCITON  --  0.10 <0.10  LATICACIDVEN 1.49  --   --     Recent Results (from the past 240 hour(s))  Urine culture     Status: Abnormal   Collection Time: 03/09/17 12:30 PM  Result Value Ref Range Status   Specimen Description URINE, RANDOM  Final   Special Requests NONE  Final   Culture MULTIPLE SPECIES PRESENT, SUGGEST RECOLLECTION (A)  Final   Report Status 03/10/2017 FINAL  Final  Culture, Urine     Status: None   Collection Time: 03/10/17 12:23 PM  Result Value Ref Range Status   Specimen Description URINE, RANDOM  Final   Special Requests NONE  Final   Culture NO GROWTH  Final   Report Status 03/11/2017 FINAL  Final  Respiratory Panel by  PCR     Status: None   Collection Time: 03/10/17  1:30 PM  Result Value Ref Range Status   Adenovirus NOT DETECTED NOT DETECTED Final   Coronavirus 229E NOT DETECTED NOT DETECTED Final   Coronavirus HKU1 NOT DETECTED NOT DETECTED Final   Coronavirus NL63 NOT DETECTED NOT DETECTED Final   Coronavirus OC43 NOT DETECTED NOT DETECTED Final   Metapneumovirus NOT DETECTED NOT DETECTED Final   Rhinovirus / Enterovirus NOT DETECTED NOT DETECTED Final   Influenza A NOT DETECTED NOT DETECTED Final   Influenza A H1 NOT DETECTED NOT DETECTED Final   Influenza A H1 2009 NOT DETECTED NOT DETECTED Final   Influenza A H3 NOT DETECTED NOT DETECTED Final   Influenza B NOT DETECTED NOT DETECTED Final   Parainfluenza Virus 1 NOT DETECTED NOT DETECTED Final   Parainfluenza Virus 2 NOT DETECTED NOT DETECTED Final   Parainfluenza Virus 3 NOT DETECTED NOT DETECTED Final   Parainfluenza Virus 4 NOT DETECTED NOT DETECTED Final   Respiratory Syncytial Virus NOT DETECTED NOT DETECTED Final   Bordetella pertussis NOT DETECTED NOT DETECTED Final   Chlamydophila pneumoniae NOT DETECTED NOT DETECTED Final   Mycoplasma pneumoniae NOT DETECTED NOT DETECTED Final  Culture, sputum-assessment     Status: None   Collection Time: 03/11/17  6:44 AM  Result Value Ref Range Status   Specimen Description SPUTUM  Final   Special Requests NONE  Final   Sputum evaluation   Final    Sputum specimen not acceptable for testing.  Please recollect.   Gram Stain Report Called to,Read Back By and Verified With: RN  Buena Irish [id number] MLM    Report Status 03/11/2017 FINAL  Final    Radiology Studies: Dg Chest 2 View  Result Date: 03/10/2017 CLINICAL DATA:  Cough, short of breath EXAM: CHEST  2 VIEW COMPARISON:  03/09/2017 FINDINGS: Normal cardiac silhouette with  ectatic aorta. The LEFT apical density is less pronounced but persistent and asymmetric with the RIGHT side. Lung bases are clear. No pulmonary edema. No osseous  abnormality. IMPRESSION: Persistent LEFT apical density. Recommend CT thorax to exclude pulmonary nodule versus apical scarring. Electronically Signed   By: Suzy Bouchard M.D.   On: 03/10/2017 08:11   Ct Chest W Contrast  Result Date: 03/10/2017 CLINICAL DATA:  Persisting cough. Fever and weakness. Left apical density on radiograph. EXAM: CT CHEST WITH CONTRAST TECHNIQUE: Multidetector CT imaging of the chest was performed during intravenous contrast administration. CONTRAST:  75 cc Isovue 300 IV COMPARISON:  Chest radiograph earlier this day. FINDINGS: Cardiovascular: Filling defect within medial segmental pulmonary artery of the right middle lobe consistent with pulmonary embolus. Atherosclerosis of the thoracic aorta which is tortuous. No aneurysm. There are coronary artery calcifications. Heart is normal in size. No right heart strain. Mediastinum/Nodes: Enlarged lower paratracheal node measures 15 mm short axis. Small upper paratracheal nodes. 10 mm node in the AP window. Calcified left infrahilar lymph nodes, no noncalcified hilar adenopathy. No axillary adenopathy. No pericardial effusion. The esophagus is decompressed. Visualized thyroid gland is normal. Lungs/Pleura: Masslike consolidative opacity at the left lung apex with surrounding ground-glass, corresponding to radiographic abnormality. The consolidative component measures approximately 3.2 cm. This abuts the pleura posterior superiorly. Some surrounding spiculation adjacent to the ground-glass opacity. Scattered linear atelectasis, no additional consolidation. Calcified nodules in the right and left lower lobes consistent prior granulomatous disease. No definite additional noncalcified nodules. Minimal pleural thickening in both lower lungs without pleural fluid. Upper Abdomen: Thickening of the left adrenal gland without discrete nodule. Right adrenal gland is normal. 17 mm intermediate lesion in the left upper kidney. Calcified granuloma in  the spleen. There is a 17 mm low-density structure abutting the medial right lobe of the liver that may be an exophytic liver lesion, incompletely characterized. Musculoskeletal: 2 cm nodule in the lateral left breast. Moderate compression fracture inferior T9 vertebral body. Nonspecific ill-defined sclerosis within T7 vertebral body. Remote sternal manubrial fracture. IMPRESSION: 1. Left apical density on radiograph corresponds to masslike consolidative and ground-glass opacity, 3.2 cm. Differential considerations include neoplasm versus pneumonia in the setting of cough and fever. Workup options include short interval follow-up CT after course of antibiotics to evaluate for any interval change versus PET-CT characterization, however infection may also be positive on PET. Prominent lower paratracheal an AP window nodes. 2. Incidental segmental pulmonary embolus in the right middle lobe. 3. A left breast 2 cm nodule is nonspecific, recommend mammographic evaluation to evaluate for malignancy. 4. Indeterminate 17 mm lesion in the upper left kidney. Recommend MRI characterization if patient is able to tolerate to evaluate for complex cyst versus neoplasm. Alternatively, if there are prior exams performed elsewhere, comparison with any prior imaging may be helpful. 5. Possible fluid density liver lesion, incompletely characterized on this exam, but likely cyst. 6. Aortic Atherosclerosis (ICD10-I70.0). Coronary artery calcifications. 7. T9 compression fracture appears remote. Nonspecific ill-defined sclerosis within T7 vertebral body, if neoplasm is confirmed, metastatic disease not excluded. Critical Value/emergent results were called by telephone at the time of interpretation on 03/10/2017 at 9:28 pm to Dr. Hal Hope , who verbally acknowledged these results. Electronically Signed   By: Jeb Levering M.D.   On: 03/10/2017 21:32   Scheduled Meds: . amLODipine  10 mg Oral Daily  . aspirin  81 mg Oral Daily  .  darifenacin  15 mg Oral Daily  . donepezil  10 mg  Oral BID  . gabapentin  800 mg Oral TID  . guaiFENesin  1,200 mg Oral BID  . losartan  50 mg Oral Daily  . metoprolol tartrate  25 mg Oral BID  . sertraline  50 mg Oral Daily  . simvastatin  10 mg Oral QODAY  . zolpidem  5 mg Oral QHS   Continuous Infusions: . sodium chloride 75 mL/hr at 03/11/17 0500  . azithromycin Stopped (03/11/17 1928)  . cefTRIAXone (ROCEPHIN)  IV Stopped (03/11/17 1929)  . heparin 1,200 Units/hr (03/11/17 0659)    LOS: 0 days   Kerney Elbe, DO [facility] Pager [phone]  If 7PM-7AM, please contact night-coverage [email or web address] Password [facility] 03/11/2017, 7:40 PM

## 2017-03-12 DIAGNOSIS — D491 Neoplasm of unspecified behavior of respiratory system: Secondary | ICD-10-CM

## 2017-03-12 DIAGNOSIS — I2699 Other pulmonary embolism without acute cor pulmonale: Secondary | ICD-10-CM

## 2017-03-12 LAB — COMPREHENSIVE METABOLIC PANEL
ALBUMIN: 2.6 g/dL — AB (ref 3.5–5.0)
ALT: 26 U/L (ref 14–54)
AST: 22 U/L (ref 15–41)
Alkaline Phosphatase: 48 U/L (ref 38–126)
Anion gap: 9 (ref 5–15)
BUN: 7 mg/dL (ref 6–20)
CO2: 28 mmol/L (ref 22–32)
CREATININE: 0.63 mg/dL (ref 0.44–1.00)
Calcium: 8.5 mg/dL — ABNORMAL LOW (ref 8.9–10.3)
Chloride: 106 mmol/L (ref 101–111)
GFR calc Af Amer: >60 mL/min (ref >60)
GFR calc non Af Amer: >60 mL/min (ref >60)
Glucose, Bld: 103 mg/dL — ABNORMAL HIGH (ref 65–99)
Potassium: 3.5 mmol/L (ref 3.5–5.1)
SODIUM: 143 mmol/L (ref 135–145)
Total Bilirubin: 0.5 mg/dL (ref 0.3–1.2)
Total Protein: 5.4 g/dL — ABNORMAL LOW (ref 6.5–8.1)

## 2017-03-12 LAB — CBC WITH DIFFERENTIAL/PLATELET
BASOS ABS: 0 10*3/uL (ref 0.0–0.1)
Basophils Relative: 1 %
EOS ABS: 0.3 10*3/uL (ref 0.0–0.7)
EOS PCT: 5 %
HCT: 39.1 % (ref 36.0–46.0)
Hemoglobin: 12.6 g/dL (ref 12.0–15.0)
Lymphocytes Relative: 26 %
Lymphs Abs: 1.8 10*3/uL (ref 0.7–4.0)
MCH: 30.7 pg (ref 26.0–34.0)
MCHC: 32.2 g/dL (ref 30.0–36.0)
MCV: 95.4 fL (ref 78.0–100.0)
Monocytes Absolute: 0.7 10*3/uL (ref 0.1–1.0)
Monocytes Relative: 10 %
Neutro Abs: 4 10*3/uL (ref 1.7–7.7)
Neutrophils Relative %: 58 %
PLATELETS: 203 10*3/uL (ref 150–400)
RBC: 4.1 MIL/uL (ref 3.87–5.11)
RDW: 14.3 % (ref 11.5–15.5)
WBC: 6.8 10*3/uL (ref 4.0–10.5)

## 2017-03-12 LAB — MAGNESIUM: Magnesium: 2.1 mg/dL (ref 1.7–2.4)

## 2017-03-12 LAB — PHOSPHORUS: Phosphorus: 3.2 mg/dL (ref 2.5–4.6)

## 2017-03-12 LAB — HEPARIN LEVEL (UNFRACTIONATED): HEPARIN UNFRACTIONATED: 0.43 [IU]/mL (ref 0.30–0.70)

## 2017-03-12 MED ORDER — CEFPODOXIME PROXETIL 200 MG PO TABS: 200.0000 mg | ORAL_TABLET | Freq: Two times a day (BID) | ORAL | 0 refills | Status: DC

## 2017-03-12 MED ORDER — CEFPODOXIME PROXETIL 200 MG PO TABS
200.0000 mg | ORAL_TABLET | Freq: Two times a day (BID) | ORAL | Status: DC
  Administered 2017-03-12: 200 mg via ORAL
  Filled 2017-03-12 (×2): qty 1

## 2017-03-12 MED ORDER — APIXABAN 5 MG PO TABS: 5.0000 mg | ORAL_TABLET | Freq: Two times a day (BID) | ORAL | 0 refills | Status: AC

## 2017-03-12 MED ORDER — GUAIFENESIN ER 600 MG PO TB12: 1200.0000 mg | ORAL_TABLET | Freq: Two times a day (BID) | ORAL | 0 refills | Status: DC

## 2017-03-12 MED ORDER — APIXABAN 5 MG PO TABS
10.0000 mg | ORAL_TABLET | Freq: Two times a day (BID) | ORAL | 0 refills | Status: DC
Start: 2017-03-12 — End: 2017-04-04

## 2017-03-12 MED ORDER — APIXABAN 5 MG PO TABS: 5.0000 mg | ORAL_TABLET | Freq: Two times a day (BID) | ORAL | Status: DC

## 2017-03-12 MED ORDER — APIXABAN 5 MG PO TABS
10.0000 mg | ORAL_TABLET | Freq: Two times a day (BID) | ORAL | Status: DC
  Administered 2017-03-12: 10 mg via ORAL
  Filled 2017-03-12: qty 2

## 2017-03-12 MED ORDER — AZITHROMYCIN 250 MG PO TABS
250.0000 mg | ORAL_TABLET | Freq: Every day | ORAL | Status: DC
  Administered 2017-03-12: 250 mg via ORAL
  Filled 2017-03-12: qty 1

## 2017-03-12 MED ORDER — AZITHROMYCIN 250 MG PO TABS: 250.0000 mg | ORAL_TABLET | Freq: Every day | ORAL | 0 refills | Status: AC

## 2017-03-12 NOTE — Progress Notes (Signed)
Case management notified. Pt already set up to return to [facility].

## 2017-03-12 NOTE — Progress Notes (Signed)
Pt discharging to [facility] with husband taking all personal belongings. IV discontinued, dry dressing applied. Discharge instructions provided with verbal understanding. Pt will follow up with Md per discharge summary. Pt denies pain or discomfort. No noted distress.

## 2017-03-12 NOTE — Discharge Summary (Signed)
Physician Discharge Summary  Kristie [name] [facility]:[id number] DOB: 19-Jun-1929 DOA: 03/09/2017  PCP: Blanchie Serve, MD  Admit date: 03/09/2017 Discharge date: 03/12/2017  Admitted From: Home Disposition:  [place] with PT  Recommendations for Outpatient Follow-up:  1. Follow up with PCP in 1-2 weeks 2. Please obtain CMP/CBC, Mag, Phos in one week 3. Have PCP repeat CT Scan of Chest course of antibiotics to evaluate for any interval change versus PET-CT characterization, however infection may also be positive on PET 4. Please follow up on the following pending results:  Home Health: YES  Equipment/Devices: None recommended by PT    Discharge Condition: Stable  CODE STATUS: FULL CODE Diet recommendation: Heart Healthy Diet  Brief/Interim Summary: Kristie [name] a 83 y.o.femalewith medical history significant for hypokalemia, hypertension, chronic sciatica, memory loss, tremor and other comorbids. Patient reports a several-day history of gradual progressive weakness and fatigue. Associated with intermittent cough. Patient endorses fever which started on the day of admission as high as 101. Patient was started on Macrobid for recurrent UTI by her PCP on 03/04/2017. Patient denies dysuria, frequency, abdominal pain, chest pain, palpitations, nausea, vomiting. Patient has endorsed decreased oral intake over this period of time. Was admitted for Generalized Weakness, ?CAP, and Suspected UTI. Repeated Urinalysis and Urine Cx because was negative. Obtained CT Chest because of Left Apical Density seen on CXR and showed patient possibly had a neoplasm vs. Infection, had a Left Breast mass, and a Left Kidney Neoplasm vs. Complex Cyst. Patient was also found to have a RML PE so Anticoagulation was started and she was placed on a Heparin gtt and transitioned to po Anticoagulation with Apixaban. She was worked up for her PE with ECHO and Dopplers. Did not want further workup on other findings mentioned to her on  CT Scan. She was deemed medically stable to be D/C'd Home as Abx were transitioned to po and she will need follow up CT Scan after Abx are finished to monitor interval change of ?Neoplasm vs. Pneumonia. Patient was given education about Apixaban by Pharmacy at D/C.   Discharge Diagnoses:  Active Problems:   Essential hypertension   HLD (hyperlipidemia)   Hypokalemia   Generalized weakness   Fibromyalgia syndrome   Insomnia   Recurrent UTI   CAP (community acquired pneumonia)   Left breast mass   Kidney lesion, native, left   Pulmonary embolus (HCC)  Generalized Weakness:  - Pt was unable to ambulate safely due to weakness on Admission. Likely due to poor physical reserve at basesline in the setting of poor oral intake and acute infectious process.  - Has Hx of Right Foot Drop - PT/OT recommending Home Health PT - IVF with NS at 75 mL/hr - Nutrition Evaluation appreciated and had no recommendations   Possible CAP/ Neoplasm - Pt presented w/ fevers and cough and is clinically dehydrated.  - CXR w/o evidence of acute infectious process but did show There is focal left apical opacity which may reflect a pulmonary nodule versus artifact resulting from the adjacent first ribs. There is no other focal parenchymal opacity. There is no pleural effusion or pneumothorax. The heart and mediastinal contours are unremarkable. - CTX and Azithro started in ED and changed to po Vantin and Azithromycin for 7 more days - Added Guaifenesin 1200 mg po BID  - Repeat CXR showed Persistent LEFT apical density. Recommend CT thorax to exclude pulmonary nodule versus apical scarring. - Procalcitonin was 0.10 - Influenza A/B PCR Negative - Respiratory Virus  Panel Negative - Sputum Cx was not sufficient for testing.  - Obtained CT Chest w/ Contrast to evaluate Left Density and showed Left apical density on radiograph corresponds to masslike consolidative and ground-glass opacity, 3.2 cm. Differential  considerations include neoplasm versus pneumonia in the setting of  cough and fever. Workup options include short interval follow-up CT after course of antibiotics to evaluate for any interval change versus PET-CT characterization, however infection may also be positive on PET. Prominent lower paratracheal an AP window nodes.  Left Breast Nodule -2 Cm and is Non-specific -Recommending Mammographic Evaluation to Evaluate for Malignancy -Patient states she does not want to do another Mammogram  Left Upper Kidney Lesion -Indeterminate 17 mm lesion in the upper left kidney see on CT.  -Recommendation was to obtain MRI characterization if patient is able to tolerate to evaluate for complex cyst versus neoplasm. Alternatively, if there are prior exams performed elsewhere, comparison with any prior imaging may be helpful. -Patient Refusing to have Left Kidney lesion worked up  Acute Right Middle Lobe PE -Seen on CT Scan of Chest with Contrast -Patient started on Anticoagulation with Heparin gtt and transitioned to po Apixaban  -Obtained LE Duplex and showed Small segment of deep vein thrombosis in the left peroneal veins, other visualized veins of the lower extremities appear negative for thrombosis. Negative for baker's cysts bilaterally. -ECHOCardiogram Ordered and showed no RV Strain  Recurrent/chronicUTI:  -UA not overly impressive and contaminated with 6-30 squamous epithelial. On Hiprex chronically. - Discontinued outpatient Macrobid as patient being treated with IV Ceftriaxone - Follow-up on urine culture; Urine Cx showed Multiple Species Present - Repeat Urinalysis and Urine Cx - Continue Vesicare with Pharmacy Substitution of Darifenacin 15 mg po daily - Hold Hiprex at D/C and have PCP restart - Repeat Cx showed Now Growth  ? Metastatic Disease -Patient does not want further workup and states even if she has cancer will not want chemotherapy or Radiation Treatment -Discuss with  Primary Care Physician Further  HTN: - C/w Amlodipine 10 mg po Daily and C/w Losartan 50 mg po Daily - C/w Metoprolol 25 mg po BID   HLD: - Continue Simvastatin 10 mg po EOD  Chronic Pain: - Cotninue Gabapentin 800 mg po TID - C/w Diclofenac Sodium TD Gel Topically 4 times Daily for MSK Pain  Dementia: - Continue Donepezil 10 mg po BID  Depression: - Continue Sertraline 50 mg po Daily  Insomnia: - Continue Ambien 5 mg po qHS; Used to take Lunesta as an outpatient but switched to Ambien  Hypokalemia, improved - Patient's K+ was 3.3 and improved to 3.5  - Continue to Monitor and Replete as Necessary - Repeat CMP as an outpatient and follow up with PCP to repeat  Discharge Instructions  Discharge Instructions    Call MD for:  difficulty breathing, headache or visual disturbances    Complete by:  As directed    Call MD for:  extreme fatigue    Complete by:  As directed    Call MD for:  hives    Complete by:  As directed    Call MD for:  persistant dizziness or light-headedness    Complete by:  As directed    Call MD for:  persistant nausea and vomiting    Complete by:  As directed    Call MD for:  redness, tenderness, or signs of infection (pain, swelling, redness, odor or green/yellow discharge around incision site)    Complete by:  As directed  Call MD for:  severe uncontrolled pain    Complete by:  As directed    Call MD for:  temperature >100.4    Complete by:  As directed    Diet - low sodium heart healthy    Complete by:  As directed    Discharge instructions    Complete by:  As directed    Follow up with PCP as an outpatient. Take all medications as prescribed. If symptoms change or worsen please return to the ED for evaluation.   Increase activity slowly    Complete by:  As directed      Allergies as of 03/12/2017      Reactions   Restoril [temazepam]    Nightmares   Bactrim [sulfamethoxazole-trimethoprim] Nausea And Vomiting      Medication  List    STOP taking these medications   eszopiclone 1 MG Tabs tablet Commonly known as:  LUNESTA   ibuprofen 200 MG tablet Commonly known as:  ADVIL,MOTRIN   methenamine 1 g tablet Commonly known as:  HIPREX   mirabegron ER 25 MG Tb24 tablet Commonly known as:  MYRBETRIQ   nitrofurantoin (macrocrystal-monohydrate) 100 MG capsule Commonly known as:  MACROBID   saccharomyces boulardii 250 MG capsule Commonly known as:  FLORASTOR     TAKE these medications   acetaminophen 500 MG tablet Commonly known as:  TYLENOL Take 1,000 mg by mouth at bedtime.   amLODipine 10 MG tablet Commonly known as:  NORVASC Take 1 tablet (10 mg total) by mouth daily.   apixaban 5 MG Tabs tablet Commonly known as:  ELIQUIS Take 2 tablets (10 mg total) by mouth 2 (two) times daily.   apixaban 5 MG Tabs tablet Commonly known as:  ELIQUIS Take 1 tablet (5 mg total) by mouth 2 (two) times daily. Start taking on:  03/19/2017   aspirin 81 MG chewable tablet Chew 81 mg by mouth daily.   azithromycin 250 MG tablet Commonly known as:  ZITHROMAX Take 1 tablet (250 mg total) by mouth daily. Take 250 mg po Daily for 7 days to complete 10 day course.   cefpodoxime 200 MG tablet Commonly known as:  VANTIN Take 1 tablet (200 mg total) by mouth 2 (two) times daily.   cholecalciferol 1000 units tablet Commonly known as:  VITAMIN D Take 2,000 Units by mouth daily.   diclofenac sodium 1 % Gel Commonly known as:  VOLTAREN APPLY 4 GRAMS TO 3 LARGE JOINTS UP TO 3 TIMES A DAY AS NEEDED What changed:  See the new instructions.   donepezil 10 MG tablet Commonly known as:  ARICEPT Take 10 mg by mouth 2 (two) times daily.   fluticasone 50 MCG/ACT nasal spray Commonly known as:  FLONASE Place 2 sprays into both nostrils daily. 2 sprays in each nostril once daily What changed:  additional instructions   gabapentin 800 MG tablet Commonly known as:  NEURONTIN TAKE 1 TABLET 3 TIMES A DAY.   guaiFENesin  600 MG 12 hr tablet Commonly known as:  MUCINEX Take 2 tablets (1,200 mg total) by mouth 2 (two) times daily.   losartan 50 MG tablet Commonly known as:  COZAAR Take 1 tablet (50 mg total) by mouth daily.   metoprolol tartrate 25 MG tablet Commonly known as:  LOPRESSOR TAKE 1 TABLET TWICE DAILY TO CONTROL BLOOD PRESSURE.   PROBIOTIC ACIDOPHILUS PO Take 1 capsule by mouth daily.   sertraline 50 MG tablet Commonly known as:  ZOLOFT TAKE 1 TABLET EACH DAY.  simvastatin 10 MG tablet Commonly known as:  ZOCOR Take 1 tablet (10 mg total) by mouth every other day.   solifenacin 10 MG tablet Commonly known as:  VESICARE Take 1 tablet (10 mg total) by mouth daily.   zolpidem 10 MG tablet Commonly known as:  AMBIEN Take 0.5 tablets (5 mg total) by mouth at bedtime. For 1 week and stop What changed:  how much to take  additional instructions       Allergies  Allergen Reactions  . Restoril [Temazepam]     Nightmares  . Bactrim [Sulfamethoxazole-Trimethoprim] Nausea And Vomiting    Consultations:  None  Procedures/Studies: Dg Chest 2 View  Result Date: 03/10/2017 CLINICAL DATA:  Cough, short of breath EXAM: CHEST  2 VIEW COMPARISON:  03/09/2017 FINDINGS: Normal cardiac silhouette with ectatic aorta. The LEFT apical density is less pronounced but persistent and asymmetric with the RIGHT side. Lung bases are clear. No pulmonary edema. No osseous abnormality. IMPRESSION: Persistent LEFT apical density. Recommend CT thorax to exclude pulmonary nodule versus apical scarring. Electronically Signed   By: Suzy Bouchard M.D.   On: 03/10/2017 08:11   Dg Chest 2 View  Result Date: 03/09/2017 CLINICAL DATA:  Fever, weakness for 2 days EXAM: CHEST  2 VIEW COMPARISON:  09/12/2016 FINDINGS: There is focal left apical opacity which may reflect a pulmonary nodule versus artifact resulting from the adjacent first ribs. There is no other focal parenchymal opacity. There is no pleural  effusion or pneumothorax. The heart and mediastinal contours are unremarkable. The osseous structures are unremarkable. IMPRESSION: 1. There is focal left apical opacity which may reflect a pulmonary nodule versus artifact resulting from the adjacent first ribs. Recommend apical lordotic PA chest x-ray for further evaluation. Electronically Signed   By: Kathreen Devoid   On: 03/09/2017 14:05   Ct Chest W Contrast  Result Date: 03/10/2017 CLINICAL DATA:  Persisting cough. Fever and weakness. Left apical density on radiograph. EXAM: CT CHEST WITH CONTRAST TECHNIQUE: Multidetector CT imaging of the chest was performed during intravenous contrast administration. CONTRAST:  75 cc Isovue 300 IV COMPARISON:  Chest radiograph earlier this day. FINDINGS: Cardiovascular: Filling defect within medial segmental pulmonary artery of the right middle lobe consistent with pulmonary embolus. Atherosclerosis of the thoracic aorta which is tortuous. No aneurysm. There are coronary artery calcifications. Heart is normal in size. No right heart strain. Mediastinum/Nodes: Enlarged lower paratracheal node measures 15 mm short axis. Small upper paratracheal nodes. 10 mm node in the AP window. Calcified left infrahilar lymph nodes, no noncalcified hilar adenopathy. No axillary adenopathy. No pericardial effusion. The esophagus is decompressed. Visualized thyroid gland is normal. Lungs/Pleura: Masslike consolidative opacity at the left lung apex with surrounding ground-glass, corresponding to radiographic abnormality. The consolidative component measures approximately 3.2 cm. This abuts the pleura posterior superiorly. Some surrounding spiculation adjacent to the ground-glass opacity. Scattered linear atelectasis, no additional consolidation. Calcified nodules in the right and left lower lobes consistent prior granulomatous disease. No definite additional noncalcified nodules. Minimal pleural thickening in both lower lungs without pleural  fluid. Upper Abdomen: Thickening of the left adrenal gland without discrete nodule. Right adrenal gland is normal. 17 mm intermediate lesion in the left upper kidney. Calcified granuloma in the spleen. There is a 17 mm low-density structure abutting the medial right lobe of the liver that may be an exophytic liver lesion, incompletely characterized. Musculoskeletal: 2 cm nodule in the lateral left breast. Moderate compression fracture inferior T9 vertebral body. Nonspecific ill-defined sclerosis within  T7 vertebral body. Remote sternal manubrial fracture. IMPRESSION: 1. Left apical density on radiograph corresponds to masslike consolidative and ground-glass opacity, 3.2 cm. Differential considerations include neoplasm versus pneumonia in the setting of cough and fever. Workup options include short interval follow-up CT after course of antibiotics to evaluate for any interval change versus PET-CT characterization, however infection may also be positive on PET. Prominent lower paratracheal an AP window nodes. 2. Incidental segmental pulmonary embolus in the right middle lobe. 3. A left breast 2 cm nodule is nonspecific, recommend mammographic evaluation to evaluate for malignancy. 4. Indeterminate 17 mm lesion in the upper left kidney. Recommend MRI characterization if patient is able to tolerate to evaluate for complex cyst versus neoplasm. Alternatively, if there are prior exams performed elsewhere, comparison with any prior imaging may be helpful. 5. Possible fluid density liver lesion, incompletely characterized on this exam, but likely cyst. 6. Aortic Atherosclerosis (ICD10-I70.0). Coronary artery calcifications. 7. T9 compression fracture appears remote. Nonspecific ill-defined sclerosis within T7 vertebral body, if neoplasm is confirmed, metastatic disease not excluded. Critical Value/emergent results were called by telephone at the time of interpretation on 03/10/2017 at 9:28 pm to Dr. Hal Hope , who verbally  acknowledged these results. Electronically Signed   By: Jeb Levering M.D.   On: 03/10/2017 21:32    Lower Extremity Duplex -Vascular Ultrasound Bilateral lower extremity venous duplexhas been completed. Preliminary findings: Small segment of deep vein thrombosis in the left peroneal veins, other visualized veins of the lower extremities appear negative for thrombosis. Negative for baker's cysts bilaterally  ECHOCARDIOGRAM ------------------------------------------------------------------- Study Conclusions  - Left ventricle: The cavity size was normal. There was mild concentric hypertrophy. Systolic function was normal. The estimated ejection fraction was in the range of 50% to 55%. Hypokinesis of the inferolateral and inferior myocardium. Doppler parameters are consistent with abnormal left ventricular relaxation (grade 1 diastolic dysfunction). Doppler parameters are consistent with indeterminate ventricular filling pressure. - Aortic valve: There was no regurgitation. - Mitral valve: Transvalvular velocity was within the normal range. There was no evidence for stenosis. There was mild regurgitation. - Right ventricle: The cavity size was normal. Wall thickness was normal. Systolic function was normal. - Tricuspid valve: There was mild regurgitation. - Pulmonary arteries: Systolic pressure was within the normal range. PA peak pressure: 35 mm Hg (S).  Subjective: Seen and examined and was doing well and wanting to ambulate out of bed. No CP or SOB. Had no concerns but still has a cough. No lightheadedness or dizziness. Ready to go home.   Discharge Exam: Vitals:   03/12/17 0858 03/12/17 1348  BP: 139/89 (!) 129/56  Pulse: 86 77  Resp: 18 18  Temp: 98.3 F (36.8 C) 98.9 F (37.2 C)  SpO2: 96% 93%   Vitals:   03/11/17 2042 03/12/17 0518 03/12/17 0858 03/12/17 1348  BP: 129/67 140/60 139/89 (!) 129/56  Pulse: 78 85 86 77  Resp: 18 18 18 18   Temp:  98.9 F (37.2 C) 98.2 F (36.8 C) 98.3 F (36.8 C) 98.9 F (37.2 C)  TempSrc: Oral Oral Oral Oral  SpO2: 95% 95% 96% 93%  Weight:      Height:       General: Pt is alert, awake, not in acute distress Cardiovascular: RRR, S1/S2 +, no rubs, no gallops Respiratory: Diminished bilaterally, no wheezing, no rhonchi; Patient not tachypenic or using any accessory muscles to breathe. Abdominal: Soft, NT, ND, bowel sounds + Extremities: Mild edema, no cyanosis; Has Right foot Drop  The  results of significant diagnostics from this hospitalization (including imaging, microbiology, ancillary and laboratory) are listed below for reference.    Microbiology: Recent Results (from the past 240 hour(s))  Urine culture     Status: Abnormal   Collection Time: 03/09/17 12:30 PM  Result Value Ref Range Status   Specimen Description URINE, RANDOM  Final   Special Requests NONE  Final   Culture MULTIPLE SPECIES PRESENT, SUGGEST RECOLLECTION (A)  Final   Report Status 03/10/2017 FINAL  Final  Culture, Urine     Status: None   Collection Time: 03/10/17 12:23 PM  Result Value Ref Range Status   Specimen Description URINE, RANDOM  Final   Special Requests NONE  Final   Culture NO GROWTH  Final   Report Status 03/11/2017 FINAL  Final  Respiratory Panel by PCR     Status: None   Collection Time: 03/10/17  1:30 PM  Result Value Ref Range Status   Adenovirus NOT DETECTED NOT DETECTED Final   Coronavirus 229E NOT DETECTED NOT DETECTED Final   Coronavirus HKU1 NOT DETECTED NOT DETECTED Final   Coronavirus NL63 NOT DETECTED NOT DETECTED Final   Coronavirus OC43 NOT DETECTED NOT DETECTED Final   Metapneumovirus NOT DETECTED NOT DETECTED Final   Rhinovirus / Enterovirus NOT DETECTED NOT DETECTED Final   Influenza A NOT DETECTED NOT DETECTED Final   Influenza A H1 NOT DETECTED NOT DETECTED Final   Influenza A H1 2009 NOT DETECTED NOT DETECTED Final   Influenza A H3 NOT DETECTED NOT DETECTED Final    Influenza B NOT DETECTED NOT DETECTED Final   Parainfluenza Virus 1 NOT DETECTED NOT DETECTED Final   Parainfluenza Virus 2 NOT DETECTED NOT DETECTED Final   Parainfluenza Virus 3 NOT DETECTED NOT DETECTED Final   Parainfluenza Virus 4 NOT DETECTED NOT DETECTED Final   Respiratory Syncytial Virus NOT DETECTED NOT DETECTED Final   Bordetella pertussis NOT DETECTED NOT DETECTED Final   Chlamydophila pneumoniae NOT DETECTED NOT DETECTED Final   Mycoplasma pneumoniae NOT DETECTED NOT DETECTED Final  Culture, sputum-assessment     Status: None   Collection Time: 03/11/17  6:44 AM  Result Value Ref Range Status   Specimen Description SPUTUM  Final   Special Requests NONE  Final   Sputum evaluation   Final    Sputum specimen not acceptable for testing.  Please recollect.   Gram Stain Report Called to,Read Back By and Verified With: RN  Buena Irish [phone] MLM    Report Status 03/11/2017 FINAL  Final    Labs: BNP (last 3 results) No results for input(s): BNP in the last 8760 hours. Basic Metabolic Panel:  Recent Labs Lab 03/09/17 1150 03/09/17 1849 03/10/17 0432 03/11/17 0517 03/12/17 0533  NA 141  --  141 142 143  K 3.9  --  3.3* 3.8 3.5  CL 107  --  107 109 106  CO2 24  --  27 26 28   GLUCOSE 127*  --  104* 96 103*  BUN 12  --  10 5* 7  CREATININE 0.73  --  0.67 0.68 0.63  CALCIUM 8.6*  --  8.5* 8.5* 8.5*  MG  --  2.2  --  2.0 2.1  PHOS  --  3.0  --  2.9 3.2   Liver Function Tests:  Recent Labs Lab 03/11/17 0517 03/12/17 0533  AST 24 22  ALT 25 26  ALKPHOS 47 48  BILITOT 0.5 0.5  PROT 5.5* 5.4*  ALBUMIN 2.7*  2.6*   No results for input(s): LIPASE, AMYLASE in the last 168 hours. No results for input(s): AMMONIA in the last 168 hours. CBC:  Recent Labs Lab 03/09/17 1150 03/10/17 0432 03/11/17 0517 03/12/17 0533  WBC 10.0 8.0 7.2 6.8  NEUTROABS 8.2*  --  4.6 4.0  HGB 12.8 12.2 12.6 12.6  HCT 40.0 38.4 39.1 39.1  MCV 95.0 96.5 94.2 95.4  PLT 184 180 185  203   Cardiac Enzymes: No results for input(s): CKTOTAL, CKMB, CKMBINDEX, TROPONINI in the last 168 hours. BNP: Invalid input(s): POCBNP CBG: No results for input(s): GLUCAP in the last 168 hours. D-Dimer No results for input(s): DDIMER in the last 72 hours. Hgb A1c No results for input(s): HGBA1C in the last 72 hours. Lipid Profile No results for input(s): CHOL, HDL, LDLCALC, TRIG, CHOLHDL, LDLDIRECT in the last 72 hours. Thyroid function studies No results for input(s): TSH, T4TOTAL, T3FREE, THYROIDAB in the last 72 hours.  Invalid input(s): FREET3 Anemia work up No results for input(s): VITAMINB12, FOLATE, FERRITIN, TIBC, IRON, RETICCTPCT in the last 72 hours. Urinalysis    Component Value Date/Time   COLORURINE YELLOW 03/10/2017 [place] 03/10/2017 1223   LABSPEC 1.011 03/10/2017 1223   PHURINE 6.0 03/10/2017 1223   GLUCOSEU NEGATIVE 03/10/2017 1223   HGBUR NEGATIVE 03/10/2017 1223   Jud 03/10/2017 1223   KETONESUR NEGATIVE 03/10/2017 1223   PROTEINUR NEGATIVE 03/10/2017 1223   UROBILINOGEN 0.2 04/15/2012 0720   NITRITE NEGATIVE 03/10/2017 1223   LEUKOCYTESUR NEGATIVE 03/10/2017 1223   Sepsis Labs Invalid input(s): PROCALCITONIN,  WBC,  LACTICIDVEN Microbiology Recent Results (from the past 240 hour(s))  Urine culture     Status: Abnormal   Collection Time: 03/09/17 12:30 PM  Result Value Ref Range Status   Specimen Description URINE, RANDOM  Final   Special Requests NONE  Final   Culture MULTIPLE SPECIES PRESENT, SUGGEST RECOLLECTION (A)  Final   Report Status 03/10/2017 FINAL  Final  Culture, Urine     Status: None   Collection Time: 03/10/17 12:23 PM  Result Value Ref Range Status   Specimen Description URINE, RANDOM  Final   Special Requests NONE  Final   Culture NO GROWTH  Final   Report Status 03/11/2017 FINAL  Final  Respiratory Panel by PCR     Status: None   Collection Time: 03/10/17  1:30 PM  Result Value Ref  Range Status   Adenovirus NOT DETECTED NOT DETECTED Final   Coronavirus 229E NOT DETECTED NOT DETECTED Final   Coronavirus HKU1 NOT DETECTED NOT DETECTED Final   Coronavirus NL63 NOT DETECTED NOT DETECTED Final   Coronavirus OC43 NOT DETECTED NOT DETECTED Final   Metapneumovirus NOT DETECTED NOT DETECTED Final   Rhinovirus / Enterovirus NOT DETECTED NOT DETECTED Final   Influenza A NOT DETECTED NOT DETECTED Final   Influenza A H1 NOT DETECTED NOT DETECTED Final   Influenza A H1 2009 NOT DETECTED NOT DETECTED Final   Influenza A H3 NOT DETECTED NOT DETECTED Final   Influenza B NOT DETECTED NOT DETECTED Final   Parainfluenza Virus 1 NOT DETECTED NOT DETECTED Final   Parainfluenza Virus 2 NOT DETECTED NOT DETECTED Final   Parainfluenza Virus 3 NOT DETECTED NOT DETECTED Final   Parainfluenza Virus 4 NOT DETECTED NOT DETECTED Final   Respiratory Syncytial Virus NOT DETECTED NOT DETECTED Final   Bordetella pertussis NOT DETECTED NOT DETECTED Final   Chlamydophila pneumoniae NOT DETECTED NOT DETECTED Final   Mycoplasma  pneumoniae NOT DETECTED NOT DETECTED Final  Culture, sputum-assessment     Status: None   Collection Time: 03/11/17  6:44 AM  Result Value Ref Range Status   Specimen Description SPUTUM  Final   Special Requests NONE  Final   Sputum evaluation   Final    Sputum specimen not acceptable for testing.  Please recollect.   Gram Stain Report Called to,Read Back By and Verified With: RN  Buena Irish [id number]    Report Status 03/11/2017 FINAL  Final   Time coordinating discharge: 35 minutes  SIGNED:  Kerney Elbe, DO [facility] 03/12/2017, 1:55 PM Pager [phone]  If 7PM-7AM, please contact night-coverage [email or web address] Password [email or web address]

## 2017-03-12 NOTE — Discharge Instructions (Signed)
Information on my medicine - ELIQUIS (apixaban)  Why was Eliquis prescribed for you? Eliquis was prescribed to treat blood clots that may have been found in the veins of your legs (deep vein thrombosis) or in your lungs (pulmonary embolism) and to reduce the risk of them occurring again.  What do You need to know about Eliquis ? The starting dose is 10 mg (two 5 mg tablets) taken TWICE daily for the FIRST SEVEN (7) DAYS, then on 03/19/17 the dose is reduced to ONE 5 mg tablet taken TWICE daily.  Eliquis may be taken with or without food.   Try to take the dose about the same time in the morning and in the evening. If you have difficulty swallowing the tablet whole please discuss with your pharmacist how to take the medication safely.  Take Eliquis exactly as prescribed and DO NOT stop taking Eliquis without talking to the doctor who prescribed the medication.  Stopping may increase your risk of developing a new blood clot.  Refill your prescription before you run out.  After discharge, you should have regular check-up appointments with your healthcare provider that is prescribing your Eliquis.    What do you do if you miss a dose? If a dose of ELIQUIS is not taken at the scheduled time, take it as soon as possible on the same day and twice-daily administration should be resumed. The dose should not be doubled to make up for a missed dose.  Important Safety Information A possible side effect of Eliquis is bleeding. You should call your healthcare provider right away if you experience any of the following: ? Bleeding from an injury or your nose that does not stop. ? Unusual colored urine (red or dark brown) or unusual colored stools (red or black). ? Unusual bruising for unknown reasons. ? A serious fall or if you hit your head (even if there is no bleeding).  Some medicines may interact with Eliquis and might increase your risk of bleeding or clotting while on Eliquis. To help avoid  this, consult your healthcare provider or pharmacist prior to using any new prescription or non-prescription medications, including herbals, vitamins, non-steroidal anti-inflammatory drugs (NSAIDs) and supplements.  This website has more information on Eliquis (apixaban): http://www.eliquis.com/eliquis/home

## 2017-03-12 NOTE — Progress Notes (Signed)
ANTICOAGULATION CONSULT NOTE - Follow Up  Pharmacy Consult for heparin switch to apixaban. Indication: pulmonary embolus and DVT  Allergies  Allergen Reactions  . Restoril [Temazepam]     Nightmares  . Bactrim [Sulfamethoxazole-Trimethoprim] Nausea And Vomiting    Patient Measurements: Height: 5\' 8"  (172.7 cm) Weight: 175 lb (79.4 kg) IBW/kg (Calculated) : 63.9   Vital Signs: Temp: 98.3 F (36.8 C) (08/18 0858) Temp Source: Oral (08/18 0858) BP: 139/89 (08/18 0858) Pulse Rate: 86 (08/18 0858)  Labs:  Recent Labs  03/10/17 0432  03/11/17 0517 03/11/17 1344 03/11/17 2015 03/12/17 0533  HGB 12.2  --  12.6  --   --  12.6  HCT 38.4  --  39.1  --   --  39.1  PLT 180  --  185  --   --  203  HEPARINUNFRC  --   < > 0.83* 0.59 0.44 0.43  CREATININE 0.67  --  0.68  --   --  0.63  < > = values in this interval not displayed.  Estimated Creatinine Clearance: 55.9 mL/min (by C-G formula based on SCr of 0.63 mg/dL).   Medical History: Past Medical History:  Diagnosis Date  . Cataract   . Fibromyalgia 04/25/2014  . Hyperlipidemia   . Hypertension   . Lesion of sciatic nerve   . Low back pain   . Memory loss   . Spinal stenosis of lumbar region 04/25/2014  . Tremor   . Weakness     Assessment: Kristie [name] 83 yo F who was on heparin infusion for PE, pharmacy consulted for change to apixaban. Called nurse to inform to start apixaban at same time that she stops the heparin. Heparin level therapeutic, CBC stable wnl, no signs/sx bleeding.  CrCl ~56  Goal of Therapy:  Monitor platelets by anticoagulation protocol: Yes  F/u CBC w/ differential, Scr, LFTs, S/sx bleeding  Plan:  Start apixaban 10mg  PO BID x 7 days, then 5 mg PO BID. D/c heparin. F/u CBC, Scr, S/sx bleeding Will educate patient.  Nida Boatman, PharmD PGY1 Acute Care Pharmacy Resident Pager: [phone]  03/12/2017,9:26 AM

## 2017-03-12 NOTE — Care Management Note (Signed)
Case Management Note  Patient Details  Name: DANASIA BAKER MRN: [id number] Date of Birth: [birth year]  Subjective/Objective:  RNCM noted previous RNCM note. Faxed HHPT orders to [facility] for resumption of care. No additional needs.                   Action/Plan:CM will sign off for now but will be available should additional discharge needs arise or disposition change.    Expected Discharge Date:  03/12/17               Expected Discharge Plan:  [place]  In-House Referral:     Discharge planning Services  CM Consult  Post Acute Care Choice:    Choice offered to:     DME Arranged:    DME Agency:  Other - Comment ([place])  HH Arranged:  PT [facility] Agency:  Other - See comment  Status of Service:  Completed, signed off  If discussed at [place] of Stay Meetings, dates discussed:    Additional Comments:  Delrae Sawyers, RN 03/12/2017, 3:37 PM

## 2017-03-12 NOTE — Progress Notes (Signed)
Pt states that she was told that she would receive a voucher for new medication for discharge.  Case manager paged.

## 2017-03-14 ENCOUNTER — Ambulatory Visit (INDEPENDENT_AMBULATORY_CARE_PROVIDER_SITE_OTHER): Payer: Medicare Other | Admitting: Rheumatology

## 2017-03-14 ENCOUNTER — Ambulatory Visit: Payer: Medicare Other | Admitting: Rheumatology

## 2017-03-14 ENCOUNTER — Telehealth: Payer: Self-pay

## 2017-03-14 DIAGNOSIS — M1711 Unilateral primary osteoarthritis, right knee: Secondary | ICD-10-CM | POA: Diagnosis not present

## 2017-03-14 MED ORDER — LIDOCAINE HCL 1 % IJ SOLN
1.5000 mL | INTRAMUSCULAR | Status: AC | PRN
Start: 2017-03-14 — End: 2017-03-14
  Administered 2017-03-14: 1.5 mL

## 2017-03-14 MED ORDER — HYALURONAN 30 MG/2ML IX SOSY
30.0000 mg | PREFILLED_SYRINGE | INTRA_ARTICULAR | Status: AC | PRN
  Administered 2017-03-14: 30 mg via INTRA_ARTICULAR

## 2017-03-14 NOTE — Telephone Encounter (Signed)
Transition Care Management Follow-up Telephone Call  Date discharged? 03/12/2017  How have you been since you were released from the hospital? "Good but still weak"   Do you understand why you were in the hospital? yes  Do you understand the discharge instructions? yes  Where were you discharged to? Home   Items Reviewed: Medications reviewed: patient declined at this time Allergies reviewed: patient declined at this time Dietary changes reviewed: YES Referrals reviewed: YES   Functional Questionnaire:  Activities of Daily Living (ADLs):   states they are independent in the following: feeding, dressing, eating, bathing States they require assistance with the following: ambulation- walker, wheelchair   Any transportation issues/concerns?: no  Any patient concerns? No   Confirmed importance and date/time of follow-up visits scheduled : yes Provider Appointment booked with Dr.Pandey for 03/15/2017 at 11:30am  Confirmed with patient if condition begins to worsen call PCP or go to the ER.  Patient was given the office number and encouraged to call back with question or concerns.  : YES

## 2017-03-15 ENCOUNTER — Ambulatory Visit: Payer: Medicare Other | Admitting: Internal Medicine

## 2017-03-15 ENCOUNTER — Encounter: Payer: Self-pay | Admitting: Internal Medicine

## 2017-03-15 VITALS — BP 134/60 | HR 71 | Temp 98.6°F | Resp 16 | Ht 68.0 in | Wt 187.6 lb

## 2017-03-15 DIAGNOSIS — N6320 Unspecified lump in the left breast, unspecified quadrant: Secondary | ICD-10-CM | POA: Diagnosis not present

## 2017-03-15 DIAGNOSIS — R4189 Other symptoms and signs involving cognitive functions and awareness: Secondary | ICD-10-CM

## 2017-03-15 DIAGNOSIS — F5101 Primary insomnia: Secondary | ICD-10-CM | POA: Diagnosis not present

## 2017-03-15 DIAGNOSIS — Z7189 Other specified counseling: Secondary | ICD-10-CM | POA: Diagnosis not present

## 2017-03-15 DIAGNOSIS — M21371 Foot drop, right foot: Secondary | ICD-10-CM | POA: Diagnosis not present

## 2017-03-15 DIAGNOSIS — I2699 Other pulmonary embolism without acute cor pulmonale: Secondary | ICD-10-CM

## 2017-03-15 DIAGNOSIS — E876 Hypokalemia: Secondary | ICD-10-CM | POA: Diagnosis not present

## 2017-03-15 DIAGNOSIS — J181 Lobar pneumonia, unspecified organism: Secondary | ICD-10-CM | POA: Diagnosis not present

## 2017-03-15 DIAGNOSIS — R531 Weakness: Secondary | ICD-10-CM | POA: Diagnosis not present

## 2017-03-15 DIAGNOSIS — N289 Disorder of kidney and ureter, unspecified: Secondary | ICD-10-CM | POA: Diagnosis not present

## 2017-03-15 DIAGNOSIS — N390 Urinary tract infection, site not specified: Secondary | ICD-10-CM | POA: Diagnosis not present

## 2017-03-15 DIAGNOSIS — J189 Pneumonia, unspecified organism: Secondary | ICD-10-CM

## 2017-03-15 DIAGNOSIS — I10 Essential (primary) hypertension: Secondary | ICD-10-CM | POA: Diagnosis not present

## 2017-03-15 NOTE — Progress Notes (Signed)
Provider:  Blanchie Serve MD  Location[facility]   [facility]   Place of Service:   clinic  PCP: Blanchie Serve, MD Patient Care Team: Blanchie Serve, MD as PCP - General (Internal Medicine) Bo Merino, MD as Consulting Physician (Rheumatology) Juanita Craver, MD as Consulting Physician (Gastroenterology) Vickey Huger, MD as Consulting Physician (Orthopedic Surgery) Calvert Cantor, MD as Consulting Physician (Ophthalmology) Mast, Man X, NP as Nurse Practitioner (Internal Medicine)  Extended Emergency Contact Information Primary Emergency Contact: Gruendler,Joseph Address: [address], [place] Phone: [phone] Mobile Phone: [phone] Relation: Spouse  Code Status: DNR  Goals of Care: Advanced Directive information Advanced Directives 03/12/2017  Does Patient Have a Medical Advance Directive? No  Type of Advance Directive -  Does patient want to make changes to medical advance directive? -  Copy of [place] in Chart? -  Would patient like information on creating a medical advance directive? No - Patient declined  Pre-existing out of facility DNR order (yellow form or pink MOST form) -      Chief Complaint  Patient presents with  . Acute Visit    hospital follow up visit  . Medication Refill    No refills needed at this time    HPI: Patient is a 83 y.o. female seen today for hospital follow up visit. She was in the hospital from 03/09/17-03/12/17 with progressive weakness, decreased appetite and fever. She had workup with chest xray  Showing left apical density. She underwent CT chest and was noted to have right middle lobe pulmonary embolism. She was started on anticoagulation. Echocardiogram and carotid doppler were unrevealing. CT chest showed left apical mass concerning for neoplasm vs pneumonia, left breast 2 cm nodule and 17 mm lesion to left upper kidney. She was started on  antibiotic for possible pneumonia and received iv fluids. She was also diagnosed with DVT in left peroneal veins. She was discharged home with home health for PT services. She is here with her husband today. She has been weak and gets out of breath with minimal exertion. She has medical history of chronic sciatica, HTN, dementia, insomnia, recurrent UTI, HLD among others. Of note, She refused further workup on lung mass, breast lesion and kidney lesion in the hospital and she was discharged home with outpatient follow up.   Past Medical History:  Diagnosis Date  . Cataract   . Fibromyalgia 04/25/2014  . Hyperlipidemia   . Hypertension   . Lesion of sciatic nerve   . Low back pain   . Memory loss   . Spinal stenosis of lumbar region 04/25/2014  . Tremor   . Weakness    Past Surgical History:  Procedure Laterality Date  . ABDOMINAL HYSTERECTOMY  1963  . APPENDECTOMY    . CATARACT EXTRACTION W/ INTRAOCULAR LENS  IMPLANT, BILATERAL  2005 and 2007  . COLON SURGERY  1974   colon polyp  . COLON SURGERY  2002   colon polyps(2)  Dr Collene Mares  . COLONOSCOPY W/ POLYPECTOMY  1974and 2002  . EYE SURGERY  2005/2007   cataract renoval  Dr. Bing Plume  . KNEE SURGERY  2009   Dr. Ronnie Derby  . LUMBAR LAMINECTOMY  2012   L4-5 with spinous process fixation (06/22/11) Dr. Tommi Emery  . schwanoma  1987   residual right foot drop and right leg pain. Dr. Deirdre Pippins  . [place]  bowel obstruction, Dr. Rebekah Chesterfield  . TONSILLECTOMY  1940    reports that she quit smoking about 42 years ago. She has never used smokeless tobacco. She reports that she drinks about 1.2 oz of alcohol per week . She reports that she does not use drugs. Social History   Social History  . Marital status: Married    Spouse name: N/A  . Number of children: 4  . Years of education: N/A   Occupational History  . retired Public relations account executive  Retired    retired   Social History Main Topics  . Smoking status: Former Smoker    Quit  date: 04/25/1974  . Smokeless tobacco: Never Used     Comment: Quit thirty years ago.  . Alcohol use 1.2 oz/week    2 Glasses of wine per week     Comment: 2 glasses of wine daily  . Drug use: No  . Sexual activity: Not on file   Other Topics Concern  . Not on file   Social History Narrative   Patient is retired Public relations account executive and lives with her husband, (married since 1980) at [place] since 2013   Patient  is right handed. She drinks 1 cup of coffee per day.    She does not have any pets.   Former smoker for 20 years   Exercise none                          Functional Status Survey:    Family History  Problem Relation Age of Onset  . Breast cancer Mother   . Heart disease Mother   . Dementia Father   . Cancer Sister        Esophageal     Health Maintenance  Topic Date Due  . INFLUENZA VACCINE  02/23/2017  . DEXA SCAN  07/26/2017 (Originally 08/21/1995)  . TETANUS/TDAP  07/26/2017 (Originally 08/20/1947)  . PNA vac Low Risk Adult (1 of 2 - PCV13) 07/26/2017 (Originally 08/21/1995)    Allergies  Allergen Reactions  . Restoril [Temazepam]     Nightmares  . Bactrim [Sulfamethoxazole-Trimethoprim] Nausea And Vomiting    Outpatient Encounter Prescriptions as of 03/15/2017  Medication Sig  . acetaminophen (TYLENOL) 500 MG tablet Take 1,000 mg by mouth at bedtime.   Marland Kitchen amLODipine (NORVASC) 10 MG tablet Take 1 tablet (10 mg total) by mouth daily.  Marland Kitchen apixaban (ELIQUIS) 5 MG TABS tablet Take 2 tablets (10 mg total) by mouth 2 (two) times daily.  Derrill Memo ON 03/19/2017] apixaban (ELIQUIS) 5 MG TABS tablet Take 1 tablet (5 mg total) by mouth 2 (two) times daily.  Marland Kitchen aspirin 81 MG chewable tablet Chew 81 mg by mouth daily.  Marland Kitchen azithromycin (ZITHROMAX) 250 MG tablet Take 1 tablet (250 mg total) by mouth daily. Take 250 mg po Daily for 7 days to complete 10 day course.  . cefpodoxime (VANTIN) 200 MG tablet Take 1 tablet (200 mg total) by mouth 2 (two) times daily.  .  cholecalciferol (VITAMIN D) 1000 UNITS tablet Take 2,000 Units by mouth daily.   . diclofenac sodium (VOLTAREN) 1 % GEL APPLY 4 GRAMS TO 3 LARGE JOINTS UP TO 3 TIMES A DAY AS NEEDED  . donepezil (ARICEPT) 10 MG tablet Take 10 mg by mouth 2 (two) times daily.  . fluticasone (FLONASE) 50 MCG/ACT nasal spray Place 2 sprays into both nostrils daily. 2 sprays in each nostril once daily  . gabapentin (NEURONTIN) 800 MG tablet  TAKE 1 TABLET 3 TIMES A DAY.  Marland Kitchen guaiFENesin (MUCINEX) 600 MG 12 hr tablet Take 2 tablets (1,200 mg total) by mouth 2 (two) times daily.  . Lactobacillus (PROBIOTIC ACIDOPHILUS PO) Take 1 capsule by mouth daily.  Marland Kitchen losartan (COZAAR) 50 MG tablet Take 1 tablet (50 mg total) by mouth daily.  . metoprolol tartrate (LOPRESSOR) 25 MG tablet TAKE 1 TABLET TWICE DAILY TO CONTROL BLOOD PRESSURE.  . sertraline (ZOLOFT) 50 MG tablet TAKE 1 TABLET EACH DAY.  . simvastatin (ZOCOR) 10 MG tablet Take 1 tablet (10 mg total) by mouth every other day.  . solifenacin (VESICARE) 10 MG tablet Take 1 tablet (10 mg total) by mouth daily.  Marland Kitchen zolpidem (AMBIEN) 10 MG tablet Take 0.5 tablets (5 mg total) by mouth at bedtime. For 1 week and stop   No facility-administered encounter medications on file as of 03/15/2017.     Review of Systems  Constitutional: Positive for appetite change and fatigue. Negative for chills and fever.       Poor appetite  HENT: Positive for rhinorrhea and sneezing. Negative for congestion, ear discharge, ear pain, facial swelling, mouth sores, sinus pain, sinus pressure, sore throat, tinnitus and trouble swallowing.   Eyes:       Has visual disturbance and wears glasses  Respiratory: Positive for shortness of breath. Negative for cough, chest tightness and wheezing.        Short of breath with minimal exertion, cough with phlegm, denies hemoptysis  Cardiovascular: Negative for chest pain, palpitations and leg swelling.  Gastrointestinal: Negative for abdominal pain, blood in  stool, diarrhea, nausea and vomiting.  Genitourinary: Negative for dysuria, flank pain, frequency, hematuria and pelvic pain.  Musculoskeletal: Positive for gait problem. Negative for back pain.       Right leg brace. Left ankle swollen and painful since fall on 03/09/17  Neurological: Positive for dizziness and weakness. Negative for tremors, seizures, syncope, numbness and headaches.  Hematological: Does not bruise/bleed easily.  Psychiatric/Behavioral: Positive for sleep disturbance. Negative for behavioral problems, confusion and decreased concentration. The patient is not nervous/anxious.     Vitals:   03/15/17 1125  BP: 134/60  Pulse: 71  Resp: 16  Temp: 98.6 F (37 C)  TempSrc: Oral  SpO2: 92%  Weight: 187 lb 9.6 oz (85.1 kg)  Height: '5\' 8"'$  (1.727 m)   Body mass index is 28.52 kg/m. Physical Exam  Constitutional: She is oriented to person, place, and time. She appears well-developed and well-nourished. No distress.  frail  HENT:  Head: Normocephalic and atraumatic.  Mouth/Throat: Oropharynx is clear and moist. No oropharyngeal exudate.  Eyes: Pupils are equal, round, and reactive to light. Conjunctivae and EOM are normal. Right eye exhibits no discharge. Left eye exhibits no discharge.  Neck: Normal range of motion. Neck supple. No JVD present. No thyromegaly present.  Cardiovascular: Normal rate and regular rhythm.   No murmur heard. Pulmonary/Chest: Effort normal. No respiratory distress. She has no wheezes. She has no rales. She exhibits no tenderness.  Decreased air entry  Abdominal: Soft. Bowel sounds are normal. She exhibits no distension. There is no tenderness. There is no rebound and no guarding.  Musculoskeletal: She exhibits edema.  Trace edema to both legs, left foot 1+ edema with bruise to her foot, able to move her toes but rotation at ankle causes pain, right leg with brace, can move all 4 extremities, uses rolling walker with seat.   Lymphadenopathy:     She has no cervical  adenopathy.  Neurological: She is alert and oriented to person, place, and time.  Skin: Skin is warm and dry. No rash noted. She is not diaphoretic. No erythema. No pallor.  Psychiatric: She has a normal mood and affect. Her behavior is normal.    Labs reviewed: Basic Metabolic Panel:  Recent Labs  03/09/17 1849 03/10/17 0432 03/11/17 0517 03/12/17 0533  NA  --  141 142 143  K  --  3.3* 3.8 3.5  CL  --  107 109 106  CO2  --  _0 GLUCOSE  --  104* 96 103*  BUN  --  10 5* 7  CREATININE  --  0.67 0.68 0.63  CALCIUM  --  8.5* 8.5* 8.5*  MG 2.2  --  2.0 2.1  PHOS 3.0  --  2.9 3.2   Liver Function Tests:  Recent Labs  01/19/17 1213 03/11/17 0517 03/12/17 0533  AST _1 ALT _2 ALKPHOS 52 47 48  BILITOT 0.6 0.5 0.5  PROT 6.3 5.5* 5.4*  ALBUMIN 4.0 2.7* 2.6*   No results for input(s): LIPASE, AMYLASE in the last 8760 hours. No results for input(s): AMMONIA in the last 8760 hours. CBC:  Recent Labs  03/09/17 1150 03/10/17 0432 03/11/17 0517 03/12/17 0533  WBC 10.0 8.0 7.2 6.8  NEUTROABS 8.2*  --  4.6 4.0  HGB 12.8 12.2 12.6 12.6  HCT 40.0 38.4 39.1 39.1  MCV 95.0 96.5 94.2 95.4  PLT 184 180 185 203   Cardiac Enzymes: No results for input(s): CKTOTAL, CKMB, CKMBINDEX, TROPONINI in the last 8760 hours. BNP: Invalid input(s): POCBNP Lab Results  Component Value Date   HGBA1C 5.0 01/19/2017   Lab Results  Component Value Date   TSH 1.897 09/13/2016   Lab Results  Component Value Date   [id number] 403 09/13/2016   No results found for: FOLATE No results found for: IRON, TIBC, FERRITIN  Imaging and Procedures obtained prior to SNF admission: Dg Chest 2 View  Result Date: 03/10/2017 CLINICAL DATA:  Cough, short of breath EXAM: CHEST  2 VIEW COMPARISON:  03/09/2017 FINDINGS: Normal cardiac silhouette with ectatic aorta. The LEFT apical density is less pronounced but persistent and asymmetric with the RIGHT side. Lung  bases are clear. No pulmonary edema. No osseous abnormality. IMPRESSION: Persistent LEFT apical density. Recommend CT thorax to exclude pulmonary nodule versus apical scarring. Electronically Signed   By: Suzy Bouchard M.D.   On: 03/10/2017 08:11   Dg Chest 2 View  Result Date: 03/09/2017 CLINICAL DATA:  Fever, weakness for 2 days EXAM: CHEST  2 VIEW COMPARISON:  09/12/2016 FINDINGS: There is focal left apical opacity which may reflect a pulmonary nodule versus artifact resulting from the adjacent first ribs. There is no other focal parenchymal opacity. There is no pleural effusion or pneumothorax. The heart and mediastinal contours are unremarkable. The osseous structures are unremarkable. IMPRESSION: 1. There is focal left apical opacity which may reflect a pulmonary nodule versus artifact resulting from the adjacent first ribs. Recommend apical lordotic PA chest x-ray for further evaluation. Electronically Signed   By: Kathreen Devoid   On: 03/09/2017 14:05   Ct Chest W Contrast  Result Date: 03/10/2017 CLINICAL DATA:  Persisting cough. Fever and weakness. Left apical density on radiograph. EXAM: CT CHEST WITH CONTRAST TECHNIQUE: Multidetector CT imaging of the chest was performed during intravenous contrast administration. CONTRAST:  75 cc Isovue 300 IV COMPARISON:  Chest radiograph earlier this  day. FINDINGS: Cardiovascular: Filling defect within medial segmental pulmonary artery of the right middle lobe consistent with pulmonary embolus. Atherosclerosis of the thoracic aorta which is tortuous. No aneurysm. There are coronary artery calcifications. Heart is normal in size. No right heart strain. Mediastinum/Nodes: Enlarged lower paratracheal node measures 15 mm short axis. Small upper paratracheal nodes. 10 mm node in the AP window. Calcified left infrahilar lymph nodes, no noncalcified hilar adenopathy. No axillary adenopathy. No pericardial effusion. The esophagus is decompressed. Visualized thyroid  gland is normal. Lungs/Pleura: Masslike consolidative opacity at the left lung apex with surrounding ground-glass, corresponding to radiographic abnormality. The consolidative component measures approximately 3.2 cm. This abuts the pleura posterior superiorly. Some surrounding spiculation adjacent to the ground-glass opacity. Scattered linear atelectasis, no additional consolidation. Calcified nodules in the right and left lower lobes consistent prior granulomatous disease. No definite additional noncalcified nodules. Minimal pleural thickening in both lower lungs without pleural fluid. Upper Abdomen: Thickening of the left adrenal gland without discrete nodule. Right adrenal gland is normal. 17 mm intermediate lesion in the left upper kidney. Calcified granuloma in the spleen. There is a 17 mm low-density structure abutting the medial right lobe of the liver that may be an exophytic liver lesion, incompletely characterized. Musculoskeletal: 2 cm nodule in the lateral left breast. Moderate compression fracture inferior T9 vertebral body. Nonspecific ill-defined sclerosis within T7 vertebral body. Remote sternal manubrial fracture. IMPRESSION: 1. Left apical density on radiograph corresponds to masslike consolidative and ground-glass opacity, 3.2 cm. Differential considerations include neoplasm versus pneumonia in the setting of cough and fever. Workup options include short interval follow-up CT after course of antibiotics to evaluate for any interval change versus PET-CT characterization, however infection may also be positive on PET. Prominent lower paratracheal an AP window nodes. 2. Incidental segmental pulmonary embolus in the right middle lobe. 3. A left breast 2 cm nodule is nonspecific, recommend mammographic evaluation to evaluate for malignancy. 4. Indeterminate 17 mm lesion in the upper left kidney. Recommend MRI characterization if patient is able to tolerate to evaluate for complex cyst versus neoplasm.  Alternatively, if there are prior exams performed elsewhere, comparison with any prior imaging may be helpful. 5. Possible fluid density liver lesion, incompletely characterized on this exam, but likely cyst. 6. Aortic Atherosclerosis (ICD10-I70.0). Coronary artery calcifications. 7. T9 compression fracture appears remote. Nonspecific ill-defined sclerosis within T7 vertebral body, if neoplasm is confirmed, metastatic disease not excluded. Critical Value/emergent results were called by telephone at the time of interpretation on 03/10/2017 at 9:28 pm to Dr. Toniann Fail , who verbally acknowledged these results. Electronically Signed   By: Rubye Oaks M.D.   On: 03/10/2017 21:32    Assessment/Plan  1. Community acquired pneumonia of left upper lobe of lung (HCC) Continue and complete course of azithromycin and cefpodoxime on 03/19/17. Encouraged to use incentive spirometer to prevent atelectasis. Monitor for fever - CMP with eGFR; Future - CBC with Differential/Platelets; Future - Magnesium; Future - Phosphorus; Future  2. Generalized weakness With deconditioning, recent hospitalization with pneumonia and possible metastatic disease. Recommended transition to SNF for rehabilitation but pt not willing to do so. She also does not want any further workup for malignancy. Pt to work with therapy team to help restore her strength and balance.   3. Other acute pulmonary embolism without acute cor pulmonale (HCC) Continue apixaban 10 mg bid until 8/24 and then 5 mg bid form 8/25. Has dyspnea with minimal exertion, o2 sat stable at present.   4. Essential hypertension  Continue losartan and metoprolol tartrate, no changes to dosing made  5. Recurrent UTI Hold hiprex for now until completion of antibiotic for pneumonia, denies urinary symptom  6. Kidney lesion, native, left No further workup per patient request  7. Right foot drop Continue with right foot brace, fall prevention, has a walker  8.  Primary insomnia Off lunesta, resume ambien for now  9. Cognitive impairment aao x 3 this visit. Continue donepezil  10. Hypokalemia repleted in hospital. Check bmp in 1 week  11. Left breast mass No further workup desired by patient  12. Advanced care planning/counseling discussion Patient signed a DNR form today, a copy has been made for the chart. Completed MOST form with patient and her husband today indicating DNR, no further hospitalization, comfort measures, iv fluid and antibiotic if indicated, no feeding tube. She does not want any workup for the lung mass, kidney mass and breast mass. I spent 30 minute providing consultation in the clinic from 12:15 pm to 12:45 pm with >50% of the time spent in counseling patient and discussing MOST form. Copies of DNR and MOST form to be scanned in the chart.   Family/ staff Communication: reviewed care plan with patient and her husband.    Labs/tests ordered: cbc, cmp, magnesium, phosphorus  Follow up in 3 months or earlier if needed  [facility], MD Internal Medicine [place], [place] [address] Cell Phone (Monday-Friday 8 am - 5 pm): [phone] On Call: [phone] and follow prompts after 5 pm and on weekends Office Phone: [phone] Office Fax: [phone]

## 2017-03-16 ENCOUNTER — Encounter: Payer: Self-pay | Admitting: Nurse Practitioner

## 2017-03-16 ENCOUNTER — Ambulatory Visit: Payer: Medicare Other | Admitting: Rheumatology

## 2017-03-16 DIAGNOSIS — I82401 Acute embolism and thrombosis of unspecified deep veins of right lower extremity: Secondary | ICD-10-CM | POA: Insufficient documentation

## 2017-03-16 NOTE — Progress Notes (Signed)
   Procedure Note  Patient: Kristie [name]             Date of Birth: 05/16/1929           MRN: [id number]             Visit Date: 03/21/2017  Procedures: Visit Diagnoses:  Pain knee Orthovisc #3 Right knee  Large Joint Inj Date/Time: 03/21/2017 12:13 PM Performed by: Bo Merino Authorized by: Bo Merino   Consent Given by:  Patient Site marked: the procedure site was marked   Timeout: prior to procedure the correct patient, procedure, and site was verified   Indications:  Pain and joint swelling Location:  Knee Site:  R knee Prep: patient was prepped and draped in usual sterile fashion   Needle Size:  27 G Needle Length:  1.5 inches Approach:  Medial Ultrasound Guidance: No   Fluoroscopic Guidance: No   Arthrogram: No   Medications:  1.5 mL lidocaine 1 %; 40 mg triamcinolone acetonide 40 MG/ML Aspiration Attempted: Yes   Aspirate amount (mL):  0 Patient tolerance:  Patient tolerated the procedure well with no immediate complications    Bo Merino, MD

## 2017-03-18 ENCOUNTER — Other Ambulatory Visit: Payer: Self-pay

## 2017-03-18 ENCOUNTER — Telehealth: Payer: Self-pay | Admitting: *Deleted

## 2017-03-18 MED ORDER — ZOLPIDEM TARTRATE 10 MG PO TABS: 5.0000 mg | ORAL_TABLET | Freq: Every day | ORAL | 0 refills | Status: DC

## 2017-03-18 NOTE — Telephone Encounter (Signed)
Patient called and stated that she just saw you this week. Patient stated that she cannot sleep since she has been released from the hospital. Patient has tried the [place] and Ambien with no relief. It was 3am this morning before she went to sleep at all and then only slept a couple hours. Please Advise.

## 2017-03-18 NOTE — Telephone Encounter (Signed)
Spoke with the patient and she stated that she hasn't taken any lunesta. And that she still had some 5 mg of ambien left. Patient was also advised that she could take some melatonin along with the ambien.

## 2017-03-18 NOTE — Telephone Encounter (Signed)
Please stop lunesta as it made you groggy in the past. Take ambien 5 mg daily at bedtime half an hour before you go to bed. You can also try melatonin 5 mg daily with your ambien. Lets give this a try for atleast 2 weeks. Let me know if no improvement.

## 2017-03-21 ENCOUNTER — Ambulatory Visit (INDEPENDENT_AMBULATORY_CARE_PROVIDER_SITE_OTHER): Payer: Medicare Other | Admitting: Rheumatology

## 2017-03-21 ENCOUNTER — Ambulatory Visit: Payer: Medicare Other | Admitting: Rheumatology

## 2017-03-21 DIAGNOSIS — M1711 Unilateral primary osteoarthritis, right knee: Secondary | ICD-10-CM | POA: Diagnosis not present

## 2017-03-21 MED ORDER — TRIAMCINOLONE ACETONIDE 40 MG/ML IJ SUSP
40.0000 mg | INTRAMUSCULAR | Status: AC | PRN
  Administered 2017-03-21: 40 mg via INTRA_ARTICULAR

## 2017-03-21 MED ORDER — LIDOCAINE HCL 1 % IJ SOLN
1.5000 mL | INTRAMUSCULAR | Status: AC | PRN
  Administered 2017-03-21: 1.5 mL

## 2017-03-22 LAB — COMPLETE METABOLIC PANEL WITH GFR
ALK PHOS: 56 U/L (ref 33–130)
ALT: 18 U/L (ref 6–29)
AST: 15 U/L (ref 10–35)
Albumin: 3.7 g/dL (ref 3.6–5.1)
BUN: 15 mg/dL (ref 7–25)
CALCIUM: 8.8 mg/dL (ref 8.6–10.4)
CHLORIDE: 106 mmol/L (ref 98–110)
CO2: 28 mmol/L (ref 20–32)
CREATININE: 0.63 mg/dL (ref 0.60–0.88)
GFR, Est Non African American: 81 mL/min (ref >=60)
Glucose, Bld: 95 mg/dL (ref 65–99)
Potassium: 3.7 mmol/L (ref 3.5–5.3)
Sodium: 142 mmol/L (ref 135–146)
Total Bilirubin: 0.5 mg/dL (ref 0.2–1.2)
Total Protein: 6.2 g/dL (ref 6.1–8.1)

## 2017-03-22 LAB — CBC WITH DIFFERENTIAL/PLATELET
BASOS PCT: 1 %
Basophils Absolute: 59 cells/uL (ref 0–200)
Eosinophils Absolute: 118 cells/uL (ref 15–500)
Eosinophils Relative: 2 %
HCT: 41.7 % (ref 35.0–45.0)
Hemoglobin: 14.1 g/dL (ref 11.7–15.5)
LYMPHS PCT: 40 %
Lymphs Abs: 2360 cells/uL (ref 850–3900)
MCH: 30.4 pg (ref 27.0–33.0)
MCHC: 33.8 g/dL (ref 32.0–36.0)
MCV: 41.7 fL — AB (ref 80.0–100.0)
MONOS PCT: 10 %
MPV: 9.9 fL (ref 7.5–12.5)
Monocytes Absolute: 590 cells/uL (ref 200–950)
NEUTROS PCT: 47 %
Neutro Abs: 2773 cells/uL (ref 1500–7800)
PLATELETS: 289 10*3/uL (ref 140–400)
RBC: 4.64 MIL/uL (ref 3.80–5.10)
RDW: 13.7 % (ref 11.0–15.0)
WBC: 5.9 10*3/uL (ref 3.8–10.8)

## 2017-03-22 LAB — MAGNESIUM: Magnesium: 2.1 mg/dL (ref 1.5–2.5)

## 2017-03-22 LAB — PHOSPHORUS: Phosphorus: 3.1 mg/dL (ref 2.1–4.3)

## 2017-03-27 ENCOUNTER — Other Ambulatory Visit: Payer: Self-pay | Admitting: Internal Medicine

## 2017-03-29 ENCOUNTER — Encounter: Payer: Self-pay | Admitting: Internal Medicine

## 2017-03-29 ENCOUNTER — Other Ambulatory Visit: Payer: Self-pay | Admitting: Internal Medicine

## 2017-04-01 ENCOUNTER — Inpatient Hospital Stay (HOSPITAL_COMMUNITY): Payer: Medicare Other

## 2017-04-01 ENCOUNTER — Emergency Department (HOSPITAL_COMMUNITY): Payer: Medicare Other

## 2017-04-01 ENCOUNTER — Inpatient Hospital Stay (HOSPITAL_COMMUNITY)
Admission: EM | Admit: 2017-04-01 | Discharge: 2017-04-04 | DRG: 470 | Disposition: A | Discharge status: Skilled Nursing Facility | Payer: Medicare Other | Attending: Internal Medicine | Admitting: Internal Medicine

## 2017-04-01 ENCOUNTER — Encounter (HOSPITAL_COMMUNITY): Payer: Self-pay | Admitting: Emergency Medicine

## 2017-04-01 DIAGNOSIS — M797 Fibromyalgia: Secondary | ICD-10-CM | POA: Diagnosis present

## 2017-04-01 DIAGNOSIS — R52 Pain, unspecified: Secondary | ICD-10-CM

## 2017-04-01 DIAGNOSIS — N3941 Urge incontinence: Secondary | ICD-10-CM | POA: Diagnosis present

## 2017-04-01 DIAGNOSIS — G4700 Insomnia, unspecified: Secondary | ICD-10-CM | POA: Diagnosis present

## 2017-04-01 DIAGNOSIS — I10 Essential (primary) hypertension: Secondary | ICD-10-CM | POA: Diagnosis present

## 2017-04-01 DIAGNOSIS — F0390 Unspecified dementia without behavioral disturbance: Secondary | ICD-10-CM | POA: Diagnosis present

## 2017-04-01 DIAGNOSIS — Z87891 Personal history of nicotine dependence: Secondary | ICD-10-CM

## 2017-04-01 DIAGNOSIS — M19041 Primary osteoarthritis, right hand: Secondary | ICD-10-CM | POA: Diagnosis present

## 2017-04-01 DIAGNOSIS — Z881 Allergy status to other antibiotic agents status: Secondary | ICD-10-CM

## 2017-04-01 DIAGNOSIS — G8929 Other chronic pain: Secondary | ICD-10-CM | POA: Diagnosis present

## 2017-04-01 DIAGNOSIS — M21371 Foot drop, right foot: Secondary | ICD-10-CM | POA: Diagnosis present

## 2017-04-01 DIAGNOSIS — Z79899 Other long term (current) drug therapy: Secondary | ICD-10-CM | POA: Diagnosis not present

## 2017-04-01 DIAGNOSIS — Z86718 Personal history of other venous thrombosis and embolism: Secondary | ICD-10-CM | POA: Diagnosis not present

## 2017-04-01 DIAGNOSIS — Z860100 Personal history of colon polyps, unspecified: Secondary | ICD-10-CM

## 2017-04-01 DIAGNOSIS — F329 Major depressive disorder, single episode, unspecified: Secondary | ICD-10-CM | POA: Diagnosis present

## 2017-04-01 DIAGNOSIS — S72001A Fracture of unspecified part of neck of right femur, initial encounter for closed fracture: Secondary | ICD-10-CM | POA: Diagnosis present

## 2017-04-01 DIAGNOSIS — R269 Unspecified abnormalities of gait and mobility: Secondary | ICD-10-CM | POA: Diagnosis not present

## 2017-04-01 DIAGNOSIS — G629 Polyneuropathy, unspecified: Secondary | ICD-10-CM | POA: Diagnosis present

## 2017-04-01 DIAGNOSIS — E785 Hyperlipidemia, unspecified: Secondary | ICD-10-CM | POA: Diagnosis present

## 2017-04-01 DIAGNOSIS — F32A Depression, unspecified: Secondary | ICD-10-CM | POA: Diagnosis present

## 2017-04-01 DIAGNOSIS — Y92009 Unspecified place in unspecified non-institutional (private) residence as the place of occurrence of the external cause: Secondary | ICD-10-CM

## 2017-04-01 DIAGNOSIS — N390 Urinary tract infection, site not specified: Secondary | ICD-10-CM | POA: Diagnosis present

## 2017-04-01 DIAGNOSIS — F5101 Primary insomnia: Secondary | ICD-10-CM | POA: Diagnosis not present

## 2017-04-01 DIAGNOSIS — M810 Age-related osteoporosis without current pathological fracture: Secondary | ICD-10-CM | POA: Diagnosis present

## 2017-04-01 DIAGNOSIS — R32 Unspecified urinary incontinence: Secondary | ICD-10-CM | POA: Diagnosis present

## 2017-04-01 DIAGNOSIS — R29898 Other symptoms and signs involving the musculoskeletal system: Secondary | ICD-10-CM | POA: Diagnosis present

## 2017-04-01 DIAGNOSIS — M48061 Spinal stenosis, lumbar region without neurogenic claudication: Secondary | ICD-10-CM | POA: Diagnosis present

## 2017-04-01 DIAGNOSIS — E784 Other hyperlipidemia: Secondary | ICD-10-CM | POA: Diagnosis not present

## 2017-04-01 DIAGNOSIS — Z86711 Personal history of pulmonary embolism: Secondary | ICD-10-CM | POA: Diagnosis not present

## 2017-04-01 DIAGNOSIS — R413 Other amnesia: Secondary | ICD-10-CM | POA: Diagnosis present

## 2017-04-01 DIAGNOSIS — M549 Dorsalgia, unspecified: Secondary | ICD-10-CM | POA: Diagnosis present

## 2017-04-01 DIAGNOSIS — Z7901 Long term (current) use of anticoagulants: Secondary | ICD-10-CM | POA: Diagnosis not present

## 2017-04-01 DIAGNOSIS — M5136 Other intervertebral disc degeneration, lumbar region: Secondary | ICD-10-CM | POA: Diagnosis present

## 2017-04-01 DIAGNOSIS — Z66 Do not resuscitate: Secondary | ICD-10-CM | POA: Diagnosis present

## 2017-04-01 DIAGNOSIS — M19042 Primary osteoarthritis, left hand: Secondary | ICD-10-CM | POA: Diagnosis present

## 2017-04-01 DIAGNOSIS — M48062 Spinal stenosis, lumbar region with neurogenic claudication: Secondary | ICD-10-CM

## 2017-04-01 DIAGNOSIS — Z888 Allergy status to other drugs, medicaments and biological substances status: Secondary | ICD-10-CM

## 2017-04-01 DIAGNOSIS — I82401 Acute embolism and thrombosis of unspecified deep veins of right lower extremity: Secondary | ICD-10-CM | POA: Diagnosis present

## 2017-04-01 DIAGNOSIS — F3342 Major depressive disorder, recurrent, in full remission: Secondary | ICD-10-CM | POA: Diagnosis not present

## 2017-04-01 DIAGNOSIS — I2699 Other pulmonary embolism without acute cor pulmonale: Secondary | ICD-10-CM | POA: Diagnosis not present

## 2017-04-01 DIAGNOSIS — W010XXA Fall on same level from slipping, tripping and stumbling without subsequent striking against object, initial encounter: Secondary | ICD-10-CM | POA: Diagnosis present

## 2017-04-01 DIAGNOSIS — M51369 Other intervertebral disc degeneration, lumbar region without mention of lumbar back pain or lower extremity pain: Secondary | ICD-10-CM | POA: Diagnosis present

## 2017-04-01 DIAGNOSIS — Z01818 Encounter for other preprocedural examination: Secondary | ICD-10-CM

## 2017-04-01 LAB — URINALYSIS, ROUTINE W REFLEX MICROSCOPIC
BILIRUBIN URINE: NEGATIVE
Bacteria, UA: NONE SEEN
Glucose, UA: NEGATIVE mg/dL
HGB URINE DIPSTICK: NEGATIVE
Ketones, ur: NEGATIVE mg/dL
NITRITE: NEGATIVE
PH: 7 (ref 5.0–8.0)
Protein, ur: NEGATIVE mg/dL
SPECIFIC GRAVITY, URINE: 1.01 (ref 1.005–1.030)

## 2017-04-01 LAB — I-STAT CHEM 8, ED
BUN: 19 mg/dL (ref 6–20)
CREATININE: 0.8 mg/dL (ref 0.44–1.00)
Calcium, Ion: 1.18 mmol/L (ref 1.15–1.40)
Chloride: 103 mmol/L (ref 101–111)
GLUCOSE: 110 mg/dL — AB (ref 65–99)
HEMATOCRIT: 45 % (ref 36.0–46.0)
HEMOGLOBIN: 15.3 g/dL — AB (ref 12.0–15.0)
POTASSIUM: 4.3 mmol/L (ref 3.5–5.1)
Sodium: 141 mmol/L (ref 135–145)
TCO2: 27 mmol/L (ref 22–32)

## 2017-04-01 LAB — CBC WITH DIFFERENTIAL/PLATELET
BASOS ABS: 0 10*3/uL (ref 0.0–0.1)
BASOS PCT: 0 %
EOS ABS: 0.1 10*3/uL (ref 0.0–0.7)
Eosinophils Relative: 1 %
HCT: 45.7 % (ref 36.0–46.0)
Hemoglobin: 15 g/dL (ref 12.0–15.0)
LYMPHS PCT: 20 %
Lymphs Abs: 1.9 10*3/uL (ref 0.7–4.0)
MCH: 31.1 pg (ref 26.0–34.0)
MCHC: 32.8 g/dL (ref 30.0–36.0)
MCV: 94.6 fL (ref 78.0–100.0)
Monocytes Absolute: 0.8 10*3/uL (ref 0.1–1.0)
Monocytes Relative: 8 %
NEUTROS ABS: 6.8 10*3/uL (ref 1.7–7.7)
Neutrophils Relative %: 71 %
PLATELETS: 205 10*3/uL (ref 150–400)
RBC: 4.83 MIL/uL (ref 3.87–5.11)
RDW: 14.2 % (ref 11.5–15.5)
WBC: 9.7 10*3/uL (ref 4.0–10.5)

## 2017-04-01 LAB — APTT
aPTT: 37 seconds — ABNORMAL HIGH (ref 24–36)
aPTT: 85 seconds — ABNORMAL HIGH (ref 24–36)

## 2017-04-01 LAB — BASIC METABOLIC PANEL
ANION GAP: 7 (ref 5–15)
BUN: 16 mg/dL (ref 6–20)
CALCIUM: 9.4 mg/dL (ref 8.9–10.3)
CO2: 27 mmol/L (ref 22–32)
CREATININE: 0.84 mg/dL (ref 0.44–1.00)
Chloride: 105 mmol/L (ref 101–111)
Glucose, Bld: 116 mg/dL — ABNORMAL HIGH (ref 65–99)
Potassium: 4.3 mmol/L (ref 3.5–5.1)
Sodium: 139 mmol/L (ref 135–145)

## 2017-04-01 LAB — PROTIME-INR
INR: 1
PROTHROMBIN TIME: 13.1 s (ref 11.4–15.2)

## 2017-04-01 LAB — SURGICAL PCR SCREEN
MRSA, PCR: NEGATIVE
Staphylococcus aureus: NEGATIVE

## 2017-04-01 LAB — TYPE AND SCREEN
ABO/RH(D): O POS
Antibody Screen: NEGATIVE

## 2017-04-01 MED ORDER — HYDRALAZINE HCL 20 MG/ML IJ SOLN: 5.0000 mg | INTRAMUSCULAR | Status: DC | PRN

## 2017-04-01 MED ORDER — VITAMIN D 1000 UNITS PO TABS
2000.0000 [IU] | ORAL_TABLET | Freq: Every day | ORAL | Status: DC
  Administered 2017-04-03 – 2017-04-04 (×2): 2000 [IU] via ORAL
  Filled 2017-04-01 (×3): qty 2

## 2017-04-01 MED ORDER — ASPIRIN 81 MG PO CHEW
81.0000 mg | CHEWABLE_TABLET | Freq: Every day | ORAL | Status: DC
  Filled 2017-04-01: qty 1

## 2017-04-01 MED ORDER — OXYCODONE-ACETAMINOPHEN 5-325 MG PO TABS
1.0000 | ORAL_TABLET | ORAL | Status: DC | PRN
  Administered 2017-04-01 – 2017-04-03 (×5): 1 via ORAL
  Filled 2017-04-01 (×5): qty 1

## 2017-04-01 MED ORDER — SODIUM CHLORIDE 0.9 % IV SOLN: INTRAVENOUS | Status: DC

## 2017-04-01 MED ORDER — SIMVASTATIN 10 MG PO TABS
10.0000 mg | ORAL_TABLET | ORAL | Status: DC
  Administered 2017-04-02 – 2017-04-04 (×2): 10 mg via ORAL
  Filled 2017-04-01 (×4): qty 1

## 2017-04-01 MED ORDER — METHOCARBAMOL 500 MG PO TABS
500.0000 mg | ORAL_TABLET | Freq: Three times a day (TID) | ORAL | Status: DC | PRN
  Administered 2017-04-01 – 2017-04-02 (×2): 500 mg via ORAL
  Filled 2017-04-01 (×2): qty 1

## 2017-04-01 MED ORDER — MIRABEGRON ER 25 MG PO TB24
25.0000 mg | ORAL_TABLET | Freq: Every day | ORAL | Status: DC
  Administered 2017-04-01 – 2017-04-03 (×3): 25 mg via ORAL
  Filled 2017-04-01 (×4): qty 1

## 2017-04-01 MED ORDER — HYDROCODONE-ACETAMINOPHEN 5-325 MG PO TABS
1.0000 | ORAL_TABLET | Freq: Four times a day (QID) | ORAL | Status: DC | PRN
  Administered 2017-04-01: 2 via ORAL
  Filled 2017-04-01 (×2): qty 2

## 2017-04-01 MED ORDER — FLEET ENEMA 7-19 GM/118ML RE ENEM: 1.0000 | ENEMA | Freq: Once | RECTAL | Status: DC | PRN

## 2017-04-01 MED ORDER — LOSARTAN POTASSIUM 50 MG PO TABS
50.0000 mg | ORAL_TABLET | Freq: Every day | ORAL | Status: DC
  Administered 2017-04-01 – 2017-04-04 (×3): 50 mg via ORAL
  Filled 2017-04-01 (×3): qty 1

## 2017-04-01 MED ORDER — HEPARIN (PORCINE) IN NACL 100-0.45 UNIT/ML-% IJ SOLN
1100.0000 [IU]/h | INTRAMUSCULAR | Status: DC
  Administered 2017-04-01 – 2017-04-02 (×2): 1200 [IU]/h via INTRAVENOUS
  Filled 2017-04-01 (×2): qty 250

## 2017-04-01 MED ORDER — SODIUM CHLORIDE 0.9 % IV SOLN
INTRAVENOUS | Status: DC
  Administered 2017-04-01: 07:00:00 via INTRAVENOUS

## 2017-04-01 MED ORDER — AMLODIPINE BESYLATE 10 MG PO TABS
10.0000 mg | ORAL_TABLET | Freq: Every day | ORAL | Status: DC
  Administered 2017-04-01 – 2017-04-04 (×4): 10 mg via ORAL
  Filled 2017-04-01 (×5): qty 1

## 2017-04-01 MED ORDER — ONDANSETRON HCL 4 MG/2ML IJ SOLN: 4.0000 mg | Freq: Three times a day (TID) | INTRAMUSCULAR | Status: DC | PRN

## 2017-04-01 MED ORDER — MORPHINE SULFATE (PF) 4 MG/ML IV SOLN: 1.0000 mg | INTRAVENOUS | Status: DC | PRN

## 2017-04-01 MED ORDER — ACETAMINOPHEN 500 MG PO TABS
1000.0000 mg | ORAL_TABLET | Freq: Once | ORAL | Status: DC
  Filled 2017-04-01: qty 2

## 2017-04-01 MED ORDER — DONEPEZIL HCL 10 MG PO TABS
10.0000 mg | ORAL_TABLET | Freq: Two times a day (BID) | ORAL | Status: DC
  Administered 2017-04-01 – 2017-04-04 (×6): 10 mg via ORAL
  Filled 2017-04-01 (×6): qty 1

## 2017-04-01 MED ORDER — DARIFENACIN HYDROBROMIDE ER 15 MG PO TB24: 15.0000 mg | ORAL_TABLET | Freq: Every day | ORAL | Status: DC

## 2017-04-01 MED ORDER — BISACODYL 5 MG PO TBEC: 5.0000 mg | DELAYED_RELEASE_TABLET | Freq: Every day | ORAL | Status: DC | PRN

## 2017-04-01 MED ORDER — SENNOSIDES-DOCUSATE SODIUM 8.6-50 MG PO TABS: 1.0000 | ORAL_TABLET | Freq: Every evening | ORAL | Status: DC | PRN

## 2017-04-01 MED ORDER — GABAPENTIN 400 MG PO CAPS
800.0000 mg | ORAL_CAPSULE | Freq: Three times a day (TID) | ORAL | Status: DC
  Administered 2017-04-01 – 2017-04-04 (×10): 800 mg via ORAL
  Filled 2017-04-01 (×10): qty 2

## 2017-04-01 MED ORDER — SULFAMETHOXAZOLE-TRIMETHOPRIM 800-160 MG PO TABS
1.0000 | ORAL_TABLET | Freq: Two times a day (BID) | ORAL | Status: DC
  Administered 2017-04-01 – 2017-04-04 (×6): 1 via ORAL
  Filled 2017-04-01 (×6): qty 1

## 2017-04-01 MED ORDER — MORPHINE SULFATE (PF) 4 MG/ML IV SOLN
2.0000 mg | Freq: Once | INTRAVENOUS | Status: AC
  Administered 2017-04-01: 2 mg via INTRAVENOUS
  Filled 2017-04-01: qty 1

## 2017-04-01 MED ORDER — HYDRALAZINE HCL 20 MG/ML IJ SOLN: 5.0000 mg | Freq: Three times a day (TID) | INTRAMUSCULAR | Status: DC | PRN

## 2017-04-01 MED ORDER — SERTRALINE HCL 50 MG PO TABS
50.0000 mg | ORAL_TABLET | Freq: Every day | ORAL | Status: DC
  Administered 2017-04-01 – 2017-04-04 (×3): 50 mg via ORAL
  Filled 2017-04-01 (×3): qty 1

## 2017-04-01 MED ORDER — ZOLPIDEM TARTRATE 5 MG PO TABS
5.0000 mg | ORAL_TABLET | Freq: Every evening | ORAL | Status: DC | PRN
  Filled 2017-04-01: qty 1

## 2017-04-01 MED ORDER — ACETAMINOPHEN 500 MG PO TABS
1000.0000 mg | ORAL_TABLET | Freq: Every day | ORAL | Status: DC
  Administered 2017-04-01 – 2017-04-03 (×3): 1000 mg via ORAL
  Filled 2017-04-01 (×3): qty 2

## 2017-04-01 MED ORDER — METOPROLOL TARTRATE 25 MG PO TABS
25.0000 mg | ORAL_TABLET | Freq: Two times a day (BID) | ORAL | Status: DC
Start: 2017-04-01 — End: 2017-04-04
  Administered 2017-04-01 – 2017-04-04 (×4): 25 mg via ORAL
  Filled 2017-04-01 (×6): qty 1

## 2017-04-01 MED ORDER — FLUTICASONE PROPIONATE 50 MCG/ACT NA SUSP
2.0000 | Freq: Every day | NASAL | Status: DC
  Administered 2017-04-04: 2 via NASAL
  Filled 2017-04-01: qty 16

## 2017-04-01 NOTE — ED Provider Notes (Signed)
[facility] DEPT Provider Note   CSN: [id number] Arrival date & time: 04/01/17  6812     History   Chief Complaint Chief Complaint  Patient presents with  . Fall  . Right Hip Pain    HPI Kristie [name] is a 83 y.o. female.  83 yo F with a chief complaint of right hip pain. Patient was going to the bathroom the middle the night went to move her walker slipped on the floor and she fell and landed on her right side. Complaining of pain to the right hip. He was unable to get up or move the leg without significant pain. Patient was just in the hospital for a pulmonary embolism and was started on eliquis. She denies head injury denies neck pain. Denies chest pain or shortness breath. Denies abdominal pain. Denies back pain.   The history is provided by the patient.  Fall  This is a new problem. The current episode started 1 to 2 hours ago. The problem occurs rarely. The problem has been resolved. Pertinent negatives include no chest pain, no headaches and no shortness of breath. Nothing aggravates the symptoms. Nothing relieves the symptoms. She has tried nothing for the symptoms. The treatment provided no relief.    Past Medical History:  Diagnosis Date  . Cataract   . Fibromyalgia 04/25/2014  . Hyperlipidemia   . Hypertension   . Lesion of sciatic nerve   . Low back pain   . Memory loss   . Spinal stenosis of lumbar region 04/25/2014  . Tremor   . Weakness     Patient Active Problem List   Diagnosis Date Noted  . Fracture of femoral neck, right, closed ([place]) 04/01/2017  . Right leg DVT ([place]) 03/16/2017  . Advanced care planning/counseling discussion 03/15/2017  . Left breast mass 03/11/2017  . Kidney lesion, native, left 03/11/2017  . Pulmonary embolus ([place]) 03/11/2017  . CAP (community acquired pneumonia) 03/09/2017  . Primary osteoarthritis of both knees 10/19/2016  . DDD (degenerative disc disease), lumbar 10/19/2016  . Osteopenia of multiple sites 10/19/2016  .  Bilateral leg weakness 09/15/2016  . Vitamin D deficiency 09/15/2016  . Cognitive impairment 09/13/2016  . Depression 08/05/2016  . Memory deficit 08/05/2016  . Recurrent UTI 06/24/2016  . Spinal stenosis of lumbar region 04/25/2014  . Fibromyalgia syndrome 04/25/2014  . Urine incontinence 04/25/2014  . Senile osteoporosis 04/25/2014  . Insomnia 04/25/2014  . Dysphonia 10/21/2013  . Right foot drop 04/27/2013  . Abnormality of gait 04/27/2013  . Generalized weakness   . HLD (hyperlipidemia) 04/14/2012  . Pubic bone fracture ([place]) 04/14/2012  . Hypokalemia 04/14/2012  . Urge incontinence 09/15/2011  . Essential hypertension 08/18/2007  . Primary osteoarthritis of both hands 08/18/2007  . OTHER ACQUIRED DEFORMITY OF ANKLE AND FOOT OTHER 08/18/2007  . UNEQUAL LEG LENGTH 08/18/2007  . COLONIC POLYPS, HX OF 08/18/2007    Past Surgical History:  Procedure Laterality Date  . ABDOMINAL HYSTERECTOMY  1963  . APPENDECTOMY    . CATARACT EXTRACTION W/ INTRAOCULAR LENS  IMPLANT, BILATERAL  2005 and 2007  . COLON SURGERY  1974   colon polyp  . COLON SURGERY  2002   colon polyps(2)  Dr Collene Mares  . COLONOSCOPY W/ POLYPECTOMY  1974and 2002  . EYE SURGERY  2005/2007   cataract renoval  Dr. Bing Plume  . KNEE SURGERY  2009   Dr. Ronnie Derby  . LUMBAR LAMINECTOMY  2012   L4-5 with spinous process fixation (06/22/11) Dr. Tommi Emery  .  schwanoma  1987   residual right foot drop and right leg pain. Dr. Deirdre Pippins  . SMALL INTESTINE SURGERY  1990   bowel obstruction, Dr. Rebekah Chesterfield  . TONSILLECTOMY  1940    OB History    Gravida Para Term Preterm AB Living   5 4     1      SAB TAB Ectopic Multiple Live Births                   Home Medications    Prior to Admission medications   Medication Sig Start Date End Date Taking? Authorizing Provider  acetaminophen (TYLENOL) 500 MG tablet Take 1,000 mg by mouth at bedtime.    Yes [provider]  amLODipine (NORVASC) 10 MG tablet Take 1 tablet (10  mg total) by mouth daily. 08/16/16  Yes Mast, Man X, NP  apixaban (ELIQUIS) 5 MG TABS tablet Take 1 tablet (5 mg total) by mouth 2 (two) times daily. 03/19/17  Yes Sheikh, Omair Latif, DO  aspirin 81 MG chewable tablet Chew 81 mg by mouth daily.   Yes [provider]  cholecalciferol (VITAMIN D) 1000 UNITS tablet Take 2,000 Units by mouth daily.    Yes [provider]  diclofenac sodium (VOLTAREN) 1 % GEL APPLY 4 GRAMS TO 3 LARGE JOINTS UP TO 3 TIMES A DAY AS NEEDED Patient taking differently: APPLY 4 GRAMS TO 3 LARGE JOINTS UP TO 3 TIMES A DAY AS NEEDED for pain 12/29/16  Yes Panwala, Naitik, PA-C  donepezil (ARICEPT) 10 MG tablet Take 10 mg by mouth 2 (two) times daily.   Yes [provider]  eszopiclone (LUNESTA) 1 MG TABS tablet Take 1 mg by mouth at bedtime. Take immediately before bedtime   Yes [provider]  fluticasone (FLONASE) 50 MCG/ACT nasal spray Place 2 sprays into both nostrils daily. 2 sprays in each nostril once daily 08/16/16  Yes Mast, Man X, NP  gabapentin (NEURONTIN) 800 MG tablet TAKE 1 TABLET 3 TIMES A DAY. Patient taking differently: TAKE 1 TABLET (800mg ) 3 TIMES A DAY. 01/31/17  Yes Mast, Man X, NP  Lactobacillus (PROBIOTIC ACIDOPHILUS PO) Take 1 capsule by mouth daily.   Yes [provider]  losartan (COZAAR) 50 MG tablet Take 1 tablet (50 mg total) by mouth daily. 08/16/16  Yes Mast, Man X, NP  metoprolol tartrate (LOPRESSOR) 25 MG tablet TAKE 1 TABLET TWICE DAILY TO CONTROL BLOOD PRESSURE. Patient taking differently: TAKE 1 TABLET (25mg ) TWICE DAILY TO CONTROL BLOOD PRESSURE. 12/27/16  Yes Estill Dooms, MD  mirabegron ER (MYRBETRIQ) 25 MG TB24 tablet Take 25 mg by mouth daily.   Yes [provider]  sertraline (ZOLOFT) 50 MG tablet TAKE 1 TABLET EACH DAY. 03/08/17  Yes Pandey, Mahima, MD  simvastatin (ZOCOR) 10 MG tablet Take 1 tablet (10 mg total) by mouth every other day. 12/03/16  Yes Estill Dooms, MD  solifenacin  (VESICARE) 10 MG tablet Take 1 tablet (10 mg total) by mouth daily. 02/09/17  Yes Eulas Post, Monica, DO  sulfamethoxazole-trimethoprim (BACTRIM DS,SEPTRA DS) 800-160 MG tablet Take 1 tablet by mouth 2 (two) times daily. 03/31/17 04/05/17 Yes [provider]  zolpidem (AMBIEN) 10 MG tablet TAKE 1 TABLET AT BEDTIME AS NEEDED. Patient taking differently: TAKE 1 TABLET (10mg ) AT BEDTIME AS NEEDED if woke up in the middle of the night. 03/29/17  Yes Blanchie Serve, MD  apixaban (ELIQUIS) 5 MG TABS tablet Take 2 tablets (10 mg total) by mouth 2 (two)  times daily. Patient not taking: Reported on 04/01/2017 03/12/17 04/01/17  Raiford Noble Latif, DO  cefpodoxime (VANTIN) 200 MG tablet Take 1 tablet (200 mg total) by mouth 2 (two) times daily. Patient not taking: Reported on 04/01/2017 03/12/17   Raiford Noble Latif, DO  guaiFENesin (MUCINEX) 600 MG 12 hr tablet Take 2 tablets (1,200 mg total) by mouth 2 (two) times daily. Patient not taking: Reported on 04/01/2017 03/12/17   Kerney Elbe, DO    Family History Family History  Problem Relation Age of Onset  . Breast cancer Mother   . Heart disease Mother   . Dementia Father   . Cancer Sister        Esophageal     Social History Social History  Substance Use Topics  . Smoking status: Former Smoker    Quit date: 04/25/1974  . Smokeless tobacco: Never Used     Comment: Quit thirty years ago.  . Alcohol use Yes     Allergies   Restoril [temazepam] and Bactrim [sulfamethoxazole-trimethoprim]   Review of Systems Review of Systems  Constitutional: Negative for chills and fever.  HENT: Negative for congestion and rhinorrhea.   Eyes: Negative for redness and visual disturbance.  Respiratory: Negative for shortness of breath and wheezing.   Cardiovascular: Negative for chest pain and palpitations.  Gastrointestinal: Negative for nausea and vomiting.  Genitourinary: Negative for dysuria and urgency.  Musculoskeletal: Positive for arthralgias and  myalgias.  Skin: Negative for pallor and wound.  Neurological: Negative for dizziness and headaches.     Physical Exam Updated Vital Signs BP (!) 152/80   Pulse 91   Temp 97.9 F (36.6 C) (Oral)   Resp 12   Ht 5\' 8"  (1.727 m)   Wt 84.8 kg (187 lb)   SpO2 95%   BMI 28.43 kg/m   Physical Exam  Constitutional: She is oriented to person, place, and time. She appears well-developed and well-nourished. No distress.  HENT:  Head: Normocephalic and atraumatic.  Eyes: Pupils are equal, round, and reactive to light. EOM are normal.  Neck: Normal range of motion. Neck supple.  Cardiovascular: Normal rate and regular rhythm.  Exam reveals no gallop and no friction rub.   No murmur heard. Pulmonary/Chest: Effort normal. She has no wheezes. She has no rales.  Abdominal: Soft. She exhibits no distension and no mass. There is no tenderness. There is no guarding.  Musculoskeletal: She exhibits tenderness. She exhibits no edema.  Shortened external irritated right lower extremity. Decreased sensation to the webspace between the first and second digit chronic per the patient. Intact sensation to light touch any other nerve distributions. She has no motor to the right ankle or foot. This is also chronic. 2+ DP pulse.  Neurological: She is alert and oriented to person, place, and time.  Skin: Skin is warm and dry. She is not diaphoretic.  Psychiatric: She has a normal mood and affect. Her behavior is normal.  Nursing note and vitals reviewed.    ED Treatments / Results  Labs (all labs ordered are listed, but only abnormal results are displayed) Labs Reviewed  BASIC METABOLIC PANEL - Abnormal; Notable for the following:       Result Value   Glucose, Bld 116 (*)    All other components within normal limits  I-STAT CHEM 8, ED - Abnormal; Notable for the following:    Glucose, Bld 110 (*)    Hemoglobin 15.3 (*)    All other components within normal limits  CBC WITH DIFFERENTIAL/PLATELET    PROTIME-INR  APTT  TYPE AND SCREEN    EKG  EKG Interpretation None       Radiology Dg Chest 1 View  Result Date: 04/01/2017 CLINICAL DATA:  Fall.  Right hip fracture. EXAM: CHEST 1 VIEW COMPARISON:  CT chest and chest 03/10/2017 FINDINGS: Heart size and pulmonary vascularity are normal. Shallow inspiration with atelectasis in the right lung base. Nodular opacity in the left apex measuring 1.9 cm diameter. This was present on previous CT chest 03/10/2017 and may be decreasing in size. Follow-up CT or PET-CT scan 3 months after the previous CT remains indicated. No airspace disease or consolidation. No blunting of costophrenic angles. No pneumothorax. Calcified and tortuous aorta. IMPRESSION: Shallow inspiration with slight atelectasis in the right lung base. No evidence of active pulmonary disease. Left apical nodule measuring 19 mm may be decreasing since a previous CT scan. Follow-up recommended. Electronically Signed   By: Lucienne Capers M.D.   On: 04/01/2017 06:15   Dg Hip Unilat W Or Wo Pelvis 2-3 Views Right  Result Date: 04/01/2017 CLINICAL DATA:  Right hip pain after a fall. EXAM: DG HIP (WITH OR WITHOUT PELVIS) 2-3V RIGHT COMPARISON:  None. FINDINGS: Acute fracture of the right femoral neck with superior subluxation of the distal fracture fragment resulting in varus angulation. No definite extension into the inter trochanteric region. No glenohumeral dislocation. Fractures of the superior and inferior right pubic rami. Sclerosis and irregularity of the right iliac wing could indicate fracture deformity or possibly bone graft donor site. This is incompletely included within the field of view. Consider CT for further evaluation clinically indicated. Postoperative changes noted in the lower lumbar spine. Surgical clips in the right pelvis. IMPRESSION: Acute fracture of the right femoral neck with varus angulation. Fractures of the right superior and inferior pubic rami. Possible fracture  versus bone graft donor site in the right iliac wing. Electronically Signed   By: Lucienne Capers M.D.   On: 04/01/2017 06:11    Procedures Procedures (including critical care time)  Medications Ordered in ED Medications  acetaminophen (TYLENOL) tablet 1,000 mg (1,000 mg Oral Refused 04/01/17 0631)  morphine 4 MG/ML injection 2 mg (not administered)  0.9 %  sodium chloride infusion (not administered)  oxyCODONE-acetaminophen (PERCOCET/ROXICET) 5-325 MG per tablet 1 tablet (not administered)  morphine 4 MG/ML injection 1 mg (not administered)  ondansetron (ZOFRAN) injection 4 mg (not administered)  methocarbamol (ROBAXIN) tablet 500 mg (not administered)  hydrALAZINE (APRESOLINE) injection 5 mg (not administered)     Initial Impression / Assessment and Plan / ED Course  I have reviewed the triage vital signs and the nursing notes.  Pertinent labs & imaging results that were available during my care of the patient were reviewed by me and considered in my medical decision making (see chart for details).     83 yo F With a chief complaints of right hip pain. Clinically is likely fractured. Will order labs and chest x-ray and EKG. Plain film of the right hip.  Patient unsure who is her ortho, in EMR has seen [facility], [facility], [facility].  Discussed with Dr. Sharol Given.    The patients results and plan were reviewed and discussed.   Any x-rays performed were independently reviewed by myself.   Differential diagnosis were considered with the presenting HPI.  Medications  acetaminophen (TYLENOL) tablet 1,000 mg (1,000 mg Oral Refused 04/01/17 0631)  morphine 4 MG/ML injection 2 mg (not administered)  0.9 %  sodium chloride infusion (not administered)  oxyCODONE-acetaminophen (PERCOCET/ROXICET) 5-325 MG per tablet 1 tablet (not administered)  morphine 4 MG/ML injection 1 mg (not administered)  ondansetron (ZOFRAN) injection 4 mg (not administered)  methocarbamol (ROBAXIN) tablet 500  mg (not administered)  hydrALAZINE (APRESOLINE) injection 5 mg (not administered)    Vitals:   04/01/17 0530 04/01/17 0545 04/01/17 0615 04/01/17 0630  BP: (!) 163/85 (!) 164/87 (!) 145/86 (!) 152/80  Pulse: 81 80 85 91  Resp: 14 18 12 12   Temp:      TempSrc:      SpO2: 96% 97% 94% 95%  Weight:      Height:        Final diagnoses:  Closed displaced fracture of right femoral neck (HCC)    Admission/ observation were discussed with the admitting physician, patient and/or family and they are comfortable with the plan.      Final Clinical Impressions(s) / ED Diagnoses   Final diagnoses:  Closed displaced fracture of right femoral neck [facility])    New Prescriptions New Prescriptions   No medications on file     Deno Etienne, DO 04/01/17 7425

## 2017-04-01 NOTE — ED Notes (Signed)
Patient transported to X-ray 

## 2017-04-01 NOTE — Progress Notes (Signed)
ANTICOAGULATION CONSULT NOTE - Follow Up Consult  Pharmacy Consult for heparin Indication: pulmonary embolus  Allergies  Allergen Reactions  . Restoril [Temazepam] Other (See Comments)    Nightmares  . Bactrim [Sulfamethoxazole-Trimethoprim] Nausea And Vomiting    Patient Measurements: Height: 5\' 8"  (172.7 cm) Weight: 187 lb (84.8 kg) IBW/kg (Calculated) : 63.9 Heparin Dosing Weight: 81kg  Vital Signs: Temp: 97.6 F (36.4 C) (09/07 1500) Temp Source: Oral (09/07 1500) BP: 138/78 (09/07 1500) Pulse Rate: 90 (09/07 1500)  Labs:  Recent Labs  04/01/17 0530 04/01/17 0536 04/01/17 1614  HGB 15.0 15.3*  --   HCT 45.7 45.0  --   PLT 205  --   --   APTT 37*  --  85*  LABPROT 13.1  --   --   INR 1.00  --   --   CREATININE 0.84 0.80  --     Estimated Creatinine Clearance: 57.6 mL/min (by C-G formula based on SCr of 0.8 mg/dL).   Assessment: 23 YOF admitted after fall resulting in femoral fracture. She was recently diagnosed with PE and was hospitalized. Initially on IV heparin, switched to Eliquis on 8/18. She was on Eliquis 5mg  PO BID PTA with last dose on 9/6 PM. APTT on admission 37 seconds.  APTT this afternoon is therapeutic at 85. Plan for right hip hemiarthroplasty tomorrow, Saturday morning at 7:30.  Goal of Therapy:  Heparin level 0.3-0.7 units/ml aPTT 66-102 seconds Monitor platelets by anticoagulation protocol: Yes   Plan:   Continue heparin infusion without bolus at rate of 1200 units/hr Daily heparin level and aPTT until correlating, daily CBC Follow surgical plans and anticoagulation plans post-op   Thank you for allowing [place] to participate in this patients care.  Jens Som, PharmD Clinical phone for 04/01/2017 from 3:30-10:30p: [phone] If after 10:30p, please call main pharmacy at: [phone] 04/01/2017 5:46 PM

## 2017-04-01 NOTE — ED Notes (Signed)
Attempted to call report to 5N, the floor stated they wouldn't take report between the hours 6:30-7:30.

## 2017-04-01 NOTE — Anesthesia Preprocedure Evaluation (Addendum)
Anesthesia Evaluation  Patient identified by MRN, date of birth, ID band Patient awake    Reviewed: Allergy & Precautions, NPO status , Patient's Chart, lab work & pertinent test results, reviewed documented beta blocker date and time   Airway Mallampati: II  TM Distance: >3 FB Neck ROM: Full    Dental no notable dental hx.    Pulmonary former smoker, PE   Pulmonary exam normal breath sounds clear to auscultation       Cardiovascular hypertension, Pt. on medications and Pt. on home beta blockers Normal cardiovascular exam Rhythm:Regular Rate:Normal  ECG: SR, rate 90  Left ventricle: The cavity size was normal. There was mild concentric hypertrophy. Systolic function was normal. The estimated ejection fraction was in the range of 50% to 55%. Hypokinesis of the inferolateral and inferior myocardium. Doppler parameters are consistent with abnormal left ventricular relaxation (grade 1 diastolic dysfunction). Doppler parameters are consistent with indeterminate ventricular filling pressure. Aortic valve: There was no regurgitation. Mitral valve: Transvalvular velocity was within the normal range. There was no evidence for stenosis. There was mild regurgitation. Right ventricle: The cavity size was normal. Wall thickness was normal. Systolic function was normal. Tricuspid valve: There was mild regurgitation. Pulmonary arteries: Systolic pressure was within the normal range. PA peak pressure: 35 mm Hg (S).   Neuro/Psych PSYCHIATRIC DISORDERS Depression Memory loss Tremor    GI/Hepatic negative GI ROS, Neg liver ROS,   Endo/Other  negative endocrine ROS  Renal/GU negative Renal ROS     Musculoskeletal  (+) Fibromyalgia -  Abdominal   Peds  Hematology negative hematology ROS (+)   Anesthesia Other Findings Hyperlipidemia  Reproductive/Obstetrics                            Anesthesia  Physical Anesthesia Plan  ASA: III  Anesthesia Plan: General   Post-op Pain Management:    Induction: Intravenous  PONV Risk Score and Plan: 3 and Ondansetron and Dexamethasone  Airway Management Planned: Oral ETT  Additional Equipment:   Intra-op Plan:   Post-operative Plan: Extubation in OR  Informed Consent: I have reviewed the patients History and Physical, chart, labs and discussed the procedure including the risks, benefits and alternatives for the proposed anesthesia with the patient or authorized representative who has indicated his/her understanding and acceptance.   Dental advisory given  Plan Discussed with: CRNA  Anesthesia Plan Comments:         Anesthesia Quick Evaluation

## 2017-04-01 NOTE — Progress Notes (Signed)
ANTICOAGULATION CONSULT NOTE - Initial Consult  Pharmacy Consult for heparin Indication: pulmonary embolus  Allergies  Allergen Reactions  . Restoril [Temazepam] Other (See Comments)    Nightmares  . Bactrim [Sulfamethoxazole-Trimethoprim] Nausea And Vomiting    Patient Measurements: Height: 5\' 8"  (172.7 cm) Weight: 187 lb (84.8 kg) IBW/kg (Calculated) : 63.9 Heparin Dosing Weight: 81kg  Vital Signs: Temp: 97.9 F (36.6 C) (09/07 0514) Temp Source: Oral (09/07 0514) BP: 140/81 (09/07 0745) Pulse Rate: 94 (09/07 0745)  Labs:  Recent Labs  04/01/17 0530 04/01/17 0536  HGB 15.0 15.3*  HCT 45.7 45.0  PLT 205  --   APTT 37*  --   LABPROT 13.1  --   INR 1.00  --   CREATININE 0.84 0.80    Estimated Creatinine Clearance: 57.6 mL/min (by C-G formula based on SCr of 0.8 mg/dL).   Assessment: 72 YOF admitted after fall resulting in femoral fracture. She was recently diagnosed with PE and was hospitalized. Initially on IV heparin, switched to Eliquis on 8/18. She was on Eliquis 5mg  PO BID PTA with last dose on 9/6 PM. APTT on admission 37 seconds. Appears she was previously therapeutic on heparin at 1200 units/hr. Baseline Hgb 15, plts 205- no bleeding noted.  Goal of Therapy:  Heparin level 0.3-0.7 units/ml aPTT 66-102 seconds Monitor platelets by anticoagulation protocol: Yes   Plan:   Start heparin infusion without bolus at rate of 1200 units/hr First aPTT in 8 hours Daily heparin level and aPTT until correlating, daily CBC Follow surgical plans and anticoagulation plans post-op  Aletheia Tangredi D. Deshana Rominger, PharmD, BCPS Clinical Pharmacist Pager: [phone] Clinical Phone for 04/01/2017 until 3:30pm: [phone] If after 3:30pm, please call main pharmacy at [phone] 04/01/2017 7:54 AM

## 2017-04-01 NOTE — ED Triage Notes (Signed)
Patient arrived with EMS from [facility] facility lost her balance and fell while using her walker , denies LOC , reports pain at right hip radiating down right thigh , CBG = 109 by EMS .

## 2017-04-01 NOTE — H&P (Addendum)
History and Physical    Kristie [name] MGQ:[id number] DOB: [birth year] DOA: 04/01/2017   PCP: Blanchie Serve, MD   Patient coming from:  Home    Chief Complaint: Fall with fracture   HPI: Kristie [name] is a 83 y.o. 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  She complains of significant pain in the right hip, unable to get up and move her legs. Other than pain medication, nothing relieves the symptoms or aggravates them. The patient denies any loss of consciousness, headache, or neck pain. She denies any vision changes. No confusion is repoted. She denies any shortness of breath or chest pain. She denies any abdominal pain.No dysuria or gross hematuria, but she does has chronic incontinence  She has chronic back pain, but this is not worse from prior.  ED Course:  BP (!) 149/93   Pulse 93   Temp 97.9 F (36.6 C) (Oral)   Resp 15   Ht 5\' 8"  (1.727 m)   Wt 84.8 kg (187 lb)   SpO2 94%   BMI 28.43 kg/m    Sodium 141 potassium 4.3 glucose 110 creatinine 0.8 hemoglobin 15.3 Chest x-ray NAD, known pulmonary nodule, smaller from prior films EKG sinus rhythm, occasional PVCs, no changes from prior Right hip x-rayAcute fracture of the right femoral neck with varus angulation.Fractures of the right superior and inferior pubic rami. Possible fracture versus bone graft donor site in the right iliac wing. Review of Systems:  As per HPI otherwise all other systems reviewed and are negative  Past Medical History:  Diagnosis Date  . Cataract   . Fibromyalgia 04/25/2014  . Hyperlipidemia   . Hypertension   . Lesion of sciatic nerve   . Low back pain   . Memory loss   .  Spinal stenosis of lumbar region 04/25/2014  . Tremor   . Weakness     Past Surgical History:  Procedure Laterality Date  . ABDOMINAL HYSTERECTOMY  1963  . APPENDECTOMY    . CATARACT EXTRACTION W/ INTRAOCULAR LENS  IMPLANT, BILATERAL  2005 and 2007  . COLON SURGERY  1974   colon polyp  . COLON SURGERY  2002   colon polyps(2)  Dr Collene Mares  . COLONOSCOPY W/ POLYPECTOMY  1974and 2002  . EYE SURGERY  2005/2007   cataract renoval  Dr. Bing Plume  . KNEE SURGERY  2009   Dr. Ronnie Derby  . LUMBAR LAMINECTOMY  2012   L4-5 with spinous process fixation (06/22/11) Dr. Tommi Emery  . schwanoma  1987   residual right foot drop and right leg pain. Dr. Deirdre Pippins  . SMALL INTESTINE SURGERY  1990   bowel obstruction, Dr. Rebekah Chesterfield  . TONSILLECTOMY  1940    Social History Social History   Social History  . Marital status: Married    Spouse name: N/A  . Number of children: 4  . Years of education: N/A   Occupational History  . retired Public relations account executive  Retired    retired   Social History Main Topics  . Smoking status: Former Smoker    Quit date: 04/25/1974  . Smokeless tobacco: Never Used     Comment: Quit thirty years ago.  . Alcohol  use Yes  . Drug use: No  . Sexual activity: Not on file   Other Topics Concern  . Not on file   Social History Narrative   Patient is retired Public relations account executive and lives with her husband, (married since 1980) at [place] since 2013   Patient  is right handed. She drinks 1 cup of coffee per day.    She does not have any pets.   Former smoker for 20 years   Exercise none                           Allergies  Allergen Reactions  . Restoril [Temazepam] Other (See Comments)    Nightmares  . Bactrim [Sulfamethoxazole-Trimethoprim] Nausea And Vomiting    Family History  Problem Relation Age of Onset  . Breast cancer Mother   . Heart disease Mother   . Dementia Father   . Cancer Sister        Esophageal       Prior to Admission medications   Medication  Sig Start Date End Date Taking? Authorizing Provider  acetaminophen (TYLENOL) 500 MG tablet Take 1,000 mg by mouth at bedtime.    Yes [provider]  amLODipine (NORVASC) 10 MG tablet Take 1 tablet (10 mg total) by mouth daily. 08/16/16  Yes Mast, Man X, NP  apixaban (ELIQUIS) 5 MG TABS tablet Take 1 tablet (5 mg total) by mouth 2 (two) times daily. 03/19/17  Yes Sheikh, Omair Latif, DO  aspirin 81 MG chewable tablet Chew 81 mg by mouth daily.   Yes [provider]  cholecalciferol (VITAMIN D) 1000 UNITS tablet Take 2,000 Units by mouth daily.    Yes [provider]  diclofenac sodium (VOLTAREN) 1 % GEL APPLY 4 GRAMS TO 3 LARGE JOINTS UP TO 3 TIMES A DAY AS NEEDED Patient taking differently: APPLY 4 GRAMS TO 3 LARGE JOINTS UP TO 3 TIMES A DAY AS NEEDED for pain 12/29/16  Yes Panwala, Naitik, PA-C  donepezil (ARICEPT) 10 MG tablet Take 10 mg by mouth 2 (two) times daily.   Yes [provider]  eszopiclone (LUNESTA) 1 MG TABS tablet Take 1 mg by mouth at bedtime. Take immediately before bedtime   Yes [provider]  fluticasone (FLONASE) 50 MCG/ACT nasal spray Place 2 sprays into both nostrils daily. 2 sprays in each nostril once daily 08/16/16  Yes Mast, Man X, NP  gabapentin (NEURONTIN) 800 MG tablet TAKE 1 TABLET 3 TIMES A DAY. Patient taking differently: TAKE 1 TABLET (800mg ) 3 TIMES A DAY. 01/31/17  Yes Mast, Man X, NP  Lactobacillus (PROBIOTIC ACIDOPHILUS PO) Take 1 capsule by mouth daily.   Yes [provider]  losartan (COZAAR) 50 MG tablet Take 1 tablet (50 mg total) by mouth daily. 08/16/16  Yes Mast, Man X, NP  metoprolol tartrate (LOPRESSOR) 25 MG tablet TAKE 1 TABLET TWICE DAILY TO CONTROL BLOOD PRESSURE. Patient taking differently: TAKE 1 TABLET (25mg ) TWICE DAILY TO CONTROL BLOOD PRESSURE. 12/27/16  Yes Estill Dooms, MD  mirabegron ER (MYRBETRIQ) 25 MG TB24 tablet Take 25 mg by mouth daily.   Yes [provider]  sertraline  (ZOLOFT) 50 MG tablet TAKE 1 TABLET EACH DAY. 03/08/17  Yes Pandey, Mahima, MD  simvastatin (ZOCOR) 10 MG tablet Take 1 tablet (10 mg total) by mouth every other day. 12/03/16  Yes Estill Dooms, MD  solifenacin (VESICARE) 10 MG tablet Take 1 tablet (10 mg  total) by mouth daily. 02/09/17  Yes Eulas Post, Monica, DO  sulfamethoxazole-trimethoprim (BACTRIM DS,SEPTRA DS) 800-160 MG tablet Take 1 tablet by mouth 2 (two) times daily. 03/31/17 04/05/17 Yes [provider]  zolpidem (AMBIEN) 10 MG tablet TAKE 1 TABLET AT BEDTIME AS NEEDED. Patient taking differently: TAKE 1 TABLET (10mg ) AT BEDTIME AS NEEDED if woke up in the middle of the night. 03/29/17  Yes Blanchie Serve, MD  apixaban (ELIQUIS) 5 MG TABS tablet Take 2 tablets (10 mg total) by mouth 2 (two) times daily. Patient not taking: Reported on 04/01/2017 03/12/17 04/01/17  Raiford Noble Latif, DO  cefpodoxime (VANTIN) 200 MG tablet Take 1 tablet (200 mg total) by mouth 2 (two) times daily. Patient not taking: Reported on 04/01/2017 03/12/17   Raiford Noble Latif, DO  guaiFENesin (MUCINEX) 600 MG 12 hr tablet Take 2 tablets (1,200 mg total) by mouth 2 (two) times daily. Patient not taking: Reported on 04/01/2017 03/12/17   Kerney Elbe, DO    Physical Exam:  Vitals:   04/01/17 0545 04/01/17 0615 04/01/17 0630 04/01/17 0700  BP: (!) 164/87 (!) 145/86 (!) 152/80 (!) 149/93  Pulse: 80 85 91 93  Resp: 18 12 12 15   Temp:      TempSrc:      SpO2: 97% 94% 95% 94%  Weight:      Height:       Constitutional: NAD, calm,  Uncomfortable due to pain,  Eyes: PERRL, lids and conjunctivae normal ENMT: Mucous membranes are moist, without exudate or lesions  Neck: normal, supple, no masses, no thyromegaly Respiratory: clear to auscultation bilaterally, no wheezing, no crackles. Normal respiratory effort  Cardiovascular: Regular rate and rhythm,  murmur, rubs or gallops. No extremity edema. 2+ pedal pulses. No carotid bruits.  Abdomen: Soft, non tender,  No hepatosplenomegaly. Bowel sounds positive.  Musculoskeletal: Shortened right lower extremity, tender to palpation at the right hip, Left lower extremity without any pain or movement deficit.  Skin: no jaundice, No open  lesions.  Neurologic: Sensation intact on the LLE and upper extremities, with decreased sensation to the first and second digits. Gait not tested Psychiatric:   Alert and oriented x 3. Somewhat anxious     Labs on Admission: I have personally reviewed following labs and imaging studies  CBC:  Recent Labs Lab 04/01/17 0530 04/01/17 0536  WBC 9.7  --   NEUTROABS 6.8  --   HGB 15.0 15.3*  HCT 45.7 45.0  MCV 94.6  --   PLT 205  --     Basic Metabolic Panel:  Recent Labs Lab 04/01/17 0530 04/01/17 0536  NA 139 141  K 4.3 4.3  CL 105 103  CO2 27  --   GLUCOSE 116* 110*  BUN 16 19  CREATININE 0.84 0.80  CALCIUM 9.4  --     GFR: Estimated Creatinine Clearance: 57.6 mL/min (by C-G formula based on SCr of 0.8 mg/dL).  Liver Function Tests: No results for input(s): AST, ALT, ALKPHOS, BILITOT, PROT, ALBUMIN in the last 168 hours. No results for input(s): LIPASE, AMYLASE in the last 168 hours. No results for input(s): AMMONIA in the last 168 hours.  Coagulation Profile:  Recent Labs Lab 04/01/17 0530  INR 1.00    Cardiac Enzymes: No results for input(s): CKTOTAL, CKMB, CKMBINDEX, TROPONINI in the last 168 hours.  BNP (last 3 results) No results for input(s): PROBNP in the last 8760 hours.  HbA1C: No results for input(s): HGBA1C in the last 72 hours.  CBG: No results for input(s): GLUCAP in the last 168 hours.  Lipid Profile: No results for input(s): CHOL, HDL, LDLCALC, TRIG, CHOLHDL, LDLDIRECT in the last 72 hours.  Thyroid Function Tests: No results for input(s): TSH, T4TOTAL, FREET4, T3FREE, THYROIDAB in the last 72 hours.  Anemia Panel: No results for input(s): VITAMINB12, FOLATE, FERRITIN, TIBC, IRON, RETICCTPCT in the last 72  hours.  Urine analysis:    Component Value Date/Time   COLORURINE YELLOW 03/10/2017 [place] 03/10/2017 1223   LABSPEC 1.011 03/10/2017 1223   PHURINE 6.0 03/10/2017 1223   GLUCOSEU NEGATIVE 03/10/2017 1223   HGBUR NEGATIVE 03/10/2017 [place] 03/10/2017 1223   [place] 03/10/2017 1223   PROTEINUR NEGATIVE 03/10/2017 1223   UROBILINOGEN 0.2 04/15/2012 0720   NITRITE NEGATIVE 03/10/2017 1223   LEUKOCYTESUR NEGATIVE 03/10/2017 1223    Sepsis Labs: @LABRCNTIP (procalcitonin:4,lacticidven:4) )No results found for this or any previous visit (from the past 240 hour(s)).   Radiological Exams on Admission: Dg Chest 1 View  Result Date: 04/01/2017 CLINICAL DATA:  Fall.  Right hip fracture. EXAM: CHEST 1 VIEW COMPARISON:  CT chest and chest 03/10/2017 FINDINGS: Heart size and pulmonary vascularity are normal. Shallow inspiration with atelectasis in the right lung base. Nodular opacity in the left apex measuring 1.9 cm diameter. This was present on previous CT chest 03/10/2017 and may be decreasing in size. Follow-up CT or PET-CT scan 3 months after the previous CT remains indicated. No airspace disease or consolidation. No blunting of costophrenic angles. No pneumothorax. Calcified and tortuous aorta. IMPRESSION: Shallow inspiration with slight atelectasis in the right lung base. No evidence of active pulmonary disease. Left apical nodule measuring 19 mm may be decreasing since a previous CT scan. Follow-up recommended. Electronically Signed   By: Lucienne Capers M.D.   On: 04/01/2017 06:15   Dg Hip Unilat W Or Wo Pelvis 2-3 Views Right  Result Date: 04/01/2017 CLINICAL DATA:  Right hip pain after a fall. EXAM: DG HIP (WITH OR WITHOUT PELVIS) 2-3V RIGHT COMPARISON:  None. FINDINGS: Acute fracture of the right femoral neck with superior subluxation of the distal fracture fragment resulting in varus angulation. No definite extension into the inter  trochanteric region. No glenohumeral dislocation. Fractures of the superior and inferior right pubic rami. Sclerosis and irregularity of the right iliac wing could indicate fracture deformity or possibly bone graft donor site. This is incompletely included within the field of view. Consider CT for further evaluation clinically indicated. Postoperative changes noted in the lower lumbar spine. Surgical clips in the right pelvis. IMPRESSION: Acute fracture of the right femoral neck with varus angulation. Fractures of the right superior and inferior pubic rami. Possible fracture versus bone graft donor site in the right iliac wing. Electronically Signed   By: Lucienne Capers M.D.   On: 04/01/2017 06:11    EKG: Independently reviewed.  Assessment/Plan Active Problems:   Fracture of femoral neck, right, closed ([place])   Essential hypertension   Primary osteoarthritis of both hands   COLONIC POLYPS, HX OF   HLD (hyperlipidemia)   Right foot drop   Abnormality of gait   Spinal stenosis of lumbar region   Fibromyalgia syndrome   Urine incontinence   Senile osteoporosis   Insomnia   Depression   Memory deficit   Bilateral leg weakness   DDD (degenerative disc disease), lumbar   Recurrent UTI   Urge incontinence   Pulmonary embolus (H[facility])   Right leg DVT ([place])  Right, closed, femoral neck fracture per XR hip  in the setting of chronic debilitation, recent hospitalization, neuropathy. Patient on Eliquis for H/o PE. She is for OR on am . CBC and CMET unremarkable. CXR NAD  Admit to med surg, anticipating surgery as per Ortho in am  Fracture order set NPO after midnight IVF NS 75 cc/h Pain control Hold ASA in am  PT/OT consult after surgery  Anticipate she will need IP rehab, will consult Care Management   History of PE And DVT Aug 2018, on Eliquis.  After d/w Pharmacy, will hold Eliquis; she is to receive Heparin per Pharmacy   Chronic back pain/ Fibromyalgia Continue pain control and  Neurontin   Hypertension BP  149/93   Pulse 93    Continue home anti-hypertensive medications  Add Hydralazine Q8 hrs prn    Hyperlipidemia Continue home statins  Dementia, mild  Continue Aricept   Depression, no acute issues  Continue home Zoloft  Urge Incontinence Continue Vesicare  Insomnia Continue Ambien    DVT prophylaxis:  Eliquis on hold due to  Anticipated surgery. Will receive Heparin per PHarmacy  Code Status:   DNR  Family Communication:  Discussed with patient and significant other Disposition Plan: Expect patient to be discharged to home after condition improves Consults called:  Ortho per EDP    Admission status: Inpatient Medsurg    Rondel Jumbo, PA-C [facility]  Attending MD note  Patient was seen, examined,treatment plan was discussed with the  Advance Practice Provider.  I have personally reviewed the clinical findings, lab,EKG, imaging studies and management of this patient in detail.I have also reviewed the orders written for this patient which were under my direction. I agree with the documentation, as recorded by the Advance Practice Provider.   Kristie [name] is a 83 y.o. female who presents with right hip fracture.  She is on Eliquis and this will be changed to heparin in anticipation of surgery tomorrow (as per report from patient, spoke with Orthopedics and plan for surgery tomorrow).  Pain control with morphine and percocet.  She has multiple changes to CT chest from 2 weeks ago when she was diagnosed with PE which should be followed up outpatient.    Gilles Chiquito, MD Internal Medicine See Amion for pager # [facility]   04/01/2017, 7:38 AM

## 2017-04-01 NOTE — Progress Notes (Signed)
This is a no charge note  Pending admission per Dr. Tyrone Nine  83 year old lady with past medical history of hypertension, hyperlipidemia, tremor, fibromyalgia, chronic back pain, pulmonary embolism on Eliquis, who presents with fall and right femoral neck fracture. Ortho, Dr. Sharol Given was consulted pt by EDP. Keep NPO now. Pt is admitted to tele bed as inpt.  Ivor Costa, MD  [facility] Pager [phone]  If 7PM-7AM, please contact night-coverage [email or web address] Password [facility] 04/01/2017, 6:34 AM

## 2017-04-01 NOTE — Consult Note (Signed)
ORTHOPAEDIC CONSULTATION  REQUESTING PHYSICIAN: Sid Falcon, MD  Chief Complaint: Right hip pain.  HPI: Kristie [name] is a 83 y.o. female who presents with displaced right femoral neck fracture. Patient states she has an urinary tract infection she has to get up frequently to urinate and she fell sustaining a femoral neck fracture. Patient denies any head trauma or loss of consciousness.  Past Medical History:  Diagnosis Date  . Cataract   . Fibromyalgia 04/25/2014  . Hyperlipidemia   . Hypertension   . Lesion of sciatic nerve   . Low back pain   . Memory loss   . Spinal stenosis of lumbar region 04/25/2014  . Tremor   . Weakness    Past Surgical History:  Procedure Laterality Date  . ABDOMINAL HYSTERECTOMY  1963  . APPENDECTOMY    . CATARACT EXTRACTION W/ INTRAOCULAR LENS  IMPLANT, BILATERAL  2005 and 2007  . COLON SURGERY  1974   colon polyp  . COLON SURGERY  2002   colon polyps(2)  Dr Collene Mares  . COLONOSCOPY W/ POLYPECTOMY  1974and 2002  . EYE SURGERY  2005/2007   cataract renoval  Dr. Bing Plume  . KNEE SURGERY  2009   Dr. Ronnie Derby  . LUMBAR LAMINECTOMY  2012   L4-5 with spinous process fixation (06/22/11) Dr. Tommi Emery  . schwanoma  1987   residual right foot drop and right leg pain. Dr. Deirdre Pippins  . SMALL INTESTINE SURGERY  1990   bowel obstruction, Dr. Rebekah Chesterfield  . TONSILLECTOMY  1940   Social History   Social History  . Marital status: Married    Spouse name: N/A  . Number of children: 4  . Years of education: N/A   Occupational History  . retired Public relations account executive  Retired    retired   Social History Main Topics  . Smoking status: Former Smoker    Quit date: 04/25/1974  . Smokeless tobacco: Never Used     Comment: Quit thirty years ago.  . Alcohol use Yes  . Drug use: No  . Sexual activity: Not Asked   Other Topics Concern  . None   Social History Narrative   Patient is retired Public relations account executive and lives with her husband, (married since 1980) at [place] since 2013   Patient  is right handed. She drinks 1 cup of coffee per day.    She does not have any pets.   Former smoker for 20 years   Exercise none                         Family History  Problem Relation Age of Onset  . Breast cancer Mother   . Heart disease Mother   . Dementia Father   . Cancer Sister        Esophageal    - negative except otherwise stated in the family history section Allergies  Allergen Reactions  . Restoril [Temazepam] Other (See Comments)    Nightmares  . Bactrim [Sulfamethoxazole-Trimethoprim] Nausea And Vomiting   Prior to Admission medications   Medication Sig Start Date End Date Taking? Authorizing Provider  acetaminophen (TYLENOL) 500 MG tablet Take 1,000 mg by mouth at bedtime.    Yes [provider]  amLODipine (NORVASC) 10 MG tablet Take 1 tablet (10 mg total) by mouth daily. 08/16/16  Yes Mast, Man X, NP  apixaban (ELIQUIS) 5 MG TABS tablet Take 1 tablet (5 mg total) by mouth 2 (two)  times daily. 03/19/17  Yes Sheikh, Omair Latif, DO  aspirin 81 MG chewable tablet Chew 81 mg by mouth daily.   Yes [provider]  cholecalciferol (VITAMIN D) 1000 UNITS tablet Take 2,000 Units by mouth daily.    Yes [provider]  diclofenac sodium (VOLTAREN) 1 % GEL APPLY 4 GRAMS TO 3 LARGE JOINTS UP TO 3 TIMES A DAY AS NEEDED Patient taking differently: APPLY 4 GRAMS TO 3 LARGE JOINTS UP TO 3 TIMES A DAY AS NEEDED for pain 12/29/16  Yes Panwala, Naitik, PA-C  donepezil (ARICEPT) 10 MG tablet Take 10 mg by mouth 2 (two) times daily.   Yes [provider]  eszopiclone (LUNESTA) 1 MG TABS tablet Take 1 mg by mouth at bedtime. Take immediately before bedtime   Yes [provider]  fluticasone (FLONASE) 50 MCG/ACT nasal spray Place 2 sprays into both nostrils daily. 2 sprays in each nostril once daily 08/16/16  Yes Mast, Man X, NP  gabapentin (NEURONTIN) 800 MG tablet TAKE 1 TABLET 3 TIMES A DAY. Patient taking  differently: TAKE 1 TABLET (800mg ) 3 TIMES A DAY. 01/31/17  Yes Mast, Man X, NP  Lactobacillus (PROBIOTIC ACIDOPHILUS PO) Take 1 capsule by mouth daily.   Yes [provider]  losartan (COZAAR) 50 MG tablet Take 1 tablet (50 mg total) by mouth daily. 08/16/16  Yes Mast, Man X, NP  metoprolol tartrate (LOPRESSOR) 25 MG tablet TAKE 1 TABLET TWICE DAILY TO CONTROL BLOOD PRESSURE. Patient taking differently: TAKE 1 TABLET (25mg ) TWICE DAILY TO CONTROL BLOOD PRESSURE. 12/27/16  Yes Estill Dooms, MD  mirabegron ER (MYRBETRIQ) 25 MG TB24 tablet Take 25 mg by mouth daily.   Yes [provider]  sertraline (ZOLOFT) 50 MG tablet TAKE 1 TABLET EACH DAY. 03/08/17  Yes Pandey, Mahima, MD  simvastatin (ZOCOR) 10 MG tablet Take 1 tablet (10 mg total) by mouth every other day. 12/03/16  Yes Estill Dooms, MD  solifenacin (VESICARE) 10 MG tablet Take 1 tablet (10 mg total) by mouth daily. 02/09/17  Yes Eulas Post, Monica, DO  sulfamethoxazole-trimethoprim (BACTRIM DS,SEPTRA DS) 800-160 MG tablet Take 1 tablet by mouth 2 (two) times daily. 03/31/17 04/05/17 Yes [provider]  zolpidem (AMBIEN) 10 MG tablet TAKE 1 TABLET AT BEDTIME AS NEEDED. Patient taking differently: TAKE 1 TABLET (10mg ) AT BEDTIME AS NEEDED if woke up in the middle of the night. 03/29/17  Yes Blanchie Serve, MD  apixaban (ELIQUIS) 5 MG TABS tablet Take 2 tablets (10 mg total) by mouth 2 (two) times daily. Patient not taking: Reported on 04/01/2017 03/12/17 04/01/17  Raiford Noble Latif, DO  cefpodoxime (VANTIN) 200 MG tablet Take 1 tablet (200 mg total) by mouth 2 (two) times daily. Patient not taking: Reported on 04/01/2017 03/12/17   Raiford Noble Latif, DO  guaiFENesin (MUCINEX) 600 MG 12 hr tablet Take 2 tablets (1,200 mg total) by mouth 2 (two) times daily. Patient not taking: Reported on 04/01/2017 03/12/17   Kerney Elbe, DO   Dg Chest 1 View  Result Date: 04/01/2017 CLINICAL DATA:  Fall.  Right hip fracture. EXAM: CHEST 1  VIEW COMPARISON:  CT chest and chest 03/10/2017 FINDINGS: Heart size and pulmonary vascularity are normal. Shallow inspiration with atelectasis in the right lung base. Nodular opacity in the left apex measuring 1.9 cm diameter. This was present on previous CT chest 03/10/2017 and may be decreasing in size. Follow-up CT or PET-CT scan 3 months after the previous CT remains indicated. No  airspace disease or consolidation. No blunting of costophrenic angles. No pneumothorax. Calcified and tortuous aorta. IMPRESSION: Shallow inspiration with slight atelectasis in the right lung base. No evidence of active pulmonary disease. Left apical nodule measuring 19 mm may be decreasing since a previous CT scan. Follow-up recommended. Electronically Signed   By: Lucienne Capers M.D.   On: 04/01/2017 06:15   Dg Hip Unilat W Or Wo Pelvis 2-3 Views Right  Result Date: 04/01/2017 CLINICAL DATA:  Right hip pain after a fall. EXAM: DG HIP (WITH OR WITHOUT PELVIS) 2-3V RIGHT COMPARISON:  None. FINDINGS: Acute fracture of the right femoral neck with superior subluxation of the distal fracture fragment resulting in varus angulation. No definite extension into the inter trochanteric region. No glenohumeral dislocation. Fractures of the superior and inferior right pubic rami. Sclerosis and irregularity of the right iliac wing could indicate fracture deformity or possibly bone graft donor site. This is incompletely included within the field of view. Consider CT for further evaluation clinically indicated. Postoperative changes noted in the lower lumbar spine. Surgical clips in the right pelvis. IMPRESSION: Acute fracture of the right femoral neck with varus angulation. Fractures of the right superior and inferior pubic rami. Possible fracture versus bone graft donor site in the right iliac wing. Electronically Signed   By: Lucienne Capers M.D.   On: 04/01/2017 06:11   - pertinent xrays, CT, MRI studies were reviewed and independently  interpreted  Positive ROS: All other systems have been reviewed and were otherwise negative with the exception of those mentioned in the HPI and as above.  Physical Exam: General: Alert, no acute distress Psychiatric: Patient is competent for consent with normal mood and affect Lymphatic: No axillary or cervical lymphadenopathy Cardiovascular: No pedal edema Respiratory: No cyanosis, no use of accessory musculature GI: No organomegaly, abdomen is soft and non-tender  Skin: Skin is intact no ecchymosis and bruising.   Neurologic: Patient does  have protective sensation bilateral lower extremities.   MUSCULOSKELETAL:  Examination patient is alert and oriented no adenopathy well-dressed her right lower extremity is shortened and externally rotated she has pain with range of motion of the hip. Her foot is better vascular intact. Does have a foot drop on the right which is secondary to previous spinal surgery. Review of the radiographs shows a displaced femoral neck fracture. She has an old pubic rami fractures well.  Assessment: Assessment: Acute right femoral neck fracture.  Plan: Plan: We will plan for right hip hemiarthroplasty tomorrow Saturday morning at 7:30. Risks and benefits were discussed including infection dislocation DVT need for additional surgery. Patient states she understands wish to proceed at this time. Discussed with patient she will need skilled nursing placement postoperatively.  Thank you for the consult and the opportunity to see Ms. Larkin Ina, MD [facility] [phone] 12:37 PM

## 2017-04-02 ENCOUNTER — Inpatient Hospital Stay (HOSPITAL_COMMUNITY): Payer: Medicare Other | Admitting: Anesthesiology

## 2017-04-02 ENCOUNTER — Encounter (HOSPITAL_COMMUNITY)
Admission: EM | Disposition: A | Discharge status: Skilled Nursing Facility | Payer: Self-pay | Source: Home / Self Care | Attending: Internal Medicine

## 2017-04-02 ENCOUNTER — Encounter (HOSPITAL_COMMUNITY): Payer: Self-pay | Admitting: Certified Registered Nurse Anesthetist

## 2017-04-02 DIAGNOSIS — M5136 Other intervertebral disc degeneration, lumbar region: Secondary | ICD-10-CM

## 2017-04-02 DIAGNOSIS — M21371 Foot drop, right foot: Secondary | ICD-10-CM

## 2017-04-02 DIAGNOSIS — R269 Unspecified abnormalities of gait and mobility: Secondary | ICD-10-CM

## 2017-04-02 DIAGNOSIS — E784 Other hyperlipidemia: Secondary | ICD-10-CM

## 2017-04-02 DIAGNOSIS — S72001A Fracture of unspecified part of neck of right femur, initial encounter for closed fracture: Principal | ICD-10-CM

## 2017-04-02 DIAGNOSIS — F3342 Major depressive disorder, recurrent, in full remission: Secondary | ICD-10-CM

## 2017-04-02 DIAGNOSIS — N390 Urinary tract infection, site not specified: Secondary | ICD-10-CM

## 2017-04-02 DIAGNOSIS — M797 Fibromyalgia: Secondary | ICD-10-CM

## 2017-04-02 DIAGNOSIS — R29898 Other symptoms and signs involving the musculoskeletal system: Secondary | ICD-10-CM

## 2017-04-02 DIAGNOSIS — R52 Pain, unspecified: Secondary | ICD-10-CM

## 2017-04-02 DIAGNOSIS — F5101 Primary insomnia: Secondary | ICD-10-CM

## 2017-04-02 DIAGNOSIS — R413 Other amnesia: Secondary | ICD-10-CM

## 2017-04-02 DIAGNOSIS — N3941 Urge incontinence: Secondary | ICD-10-CM

## 2017-04-02 DIAGNOSIS — I10 Essential (primary) hypertension: Secondary | ICD-10-CM

## 2017-04-02 HISTORY — PX: HIP ARTHROPLASTY: SHX981

## 2017-04-02 LAB — BASIC METABOLIC PANEL
ANION GAP: 5 (ref 5–15)
BUN: 15 mg/dL (ref 6–20)
CALCIUM: 8.7 mg/dL — AB (ref 8.9–10.3)
CO2: 26 mmol/L (ref 22–32)
Chloride: 104 mmol/L (ref 101–111)
Creatinine, Ser: 0.68 mg/dL (ref 0.44–1.00)
GFR calc Af Amer: >60 mL/min (ref >60)
GFR calc non Af Amer: >60 mL/min (ref >60)
Glucose, Bld: 119 mg/dL — ABNORMAL HIGH (ref 65–99)
Potassium: 4.1 mmol/L (ref 3.5–5.1)
SODIUM: 135 mmol/L (ref 135–145)

## 2017-04-02 LAB — CBC
HCT: 39.8 % (ref 36.0–46.0)
Hemoglobin: 12.8 g/dL (ref 12.0–15.0)
MCH: 30.3 pg (ref 26.0–34.0)
MCHC: 32.2 g/dL (ref 30.0–36.0)
MCV: 94.3 fL (ref 78.0–100.0)
PLATELETS: 177 10*3/uL (ref 150–400)
RBC: 4.22 MIL/uL (ref 3.87–5.11)
RDW: 14.3 % (ref 11.5–15.5)
WBC: 8.4 10*3/uL (ref 4.0–10.5)

## 2017-04-02 LAB — APTT: aPTT: 130 seconds — ABNORMAL HIGH (ref 24–36)

## 2017-04-02 LAB — HEPARIN LEVEL (UNFRACTIONATED): HEPARIN UNFRACTIONATED: 1.1 [IU]/mL — AB (ref 0.30–0.70)

## 2017-04-02 SURGERY — HEMIARTHROPLASTY, HIP, DIRECT ANTERIOR APPROACH, FOR FRACTURE
Anesthesia: General | Site: Leg Upper | Laterality: Right

## 2017-04-02 MED ORDER — METOCLOPRAMIDE HCL 5 MG PO TABS: 5.0000 mg | ORAL_TABLET | Freq: Three times a day (TID) | ORAL | Status: DC | PRN

## 2017-04-02 MED ORDER — ONDANSETRON HCL 4 MG/2ML IJ SOLN
INTRAMUSCULAR | Status: DC | PRN
  Administered 2017-04-02: 4 mg via INTRAVENOUS

## 2017-04-02 MED ORDER — FENTANYL CITRATE (PF) 100 MCG/2ML IJ SOLN: 25.0000 ug | INTRAMUSCULAR | Status: DC | PRN

## 2017-04-02 MED ORDER — 0.9 % SODIUM CHLORIDE (POUR BTL) OPTIME
TOPICAL | Status: DC | PRN
  Administered 2017-04-02: 1000 mL

## 2017-04-02 MED ORDER — CEFAZOLIN SODIUM-DEXTROSE 2-4 GM/100ML-% IV SOLN
2.0000 g | INTRAVENOUS | Status: AC
  Administered 2017-04-02: 2 g via INTRAVENOUS
  Filled 2017-04-02: qty 100

## 2017-04-02 MED ORDER — MENTHOL 3 MG MT LOZG: 1.0000 | LOZENGE | OROMUCOSAL | Status: DC | PRN

## 2017-04-02 MED ORDER — CEFAZOLIN SODIUM-DEXTROSE 2-4 GM/100ML-% IV SOLN
2.0000 g | Freq: Four times a day (QID) | INTRAVENOUS | Status: AC
  Administered 2017-04-02 (×2): 2 g via INTRAVENOUS
  Filled 2017-04-02 (×2): qty 100

## 2017-04-02 MED ORDER — METOPROLOL TARTRATE 5 MG/5ML IV SOLN
INTRAVENOUS | Status: DC | PRN
  Administered 2017-04-02: 1 mg via INTRAVENOUS
  Administered 2017-04-02: 1.5 mg via INTRAVENOUS

## 2017-04-02 MED ORDER — METOCLOPRAMIDE HCL 5 MG/ML IJ SOLN: 5.0000 mg | Freq: Three times a day (TID) | INTRAMUSCULAR | Status: DC | PRN

## 2017-04-02 MED ORDER — ONDANSETRON HCL 4 MG/2ML IJ SOLN: 4.0000 mg | Freq: Four times a day (QID) | INTRAMUSCULAR | Status: DC | PRN

## 2017-04-02 MED ORDER — LIDOCAINE HCL (CARDIAC) 20 MG/ML IV SOLN
INTRAVENOUS | Status: DC | PRN
  Administered 2017-04-02: 60 mg via INTRAVENOUS

## 2017-04-02 MED ORDER — MORPHINE SULFATE (PF) 4 MG/ML IV SOLN: 0.5000 mg | INTRAVENOUS | Status: DC | PRN

## 2017-04-02 MED ORDER — ACETAMINOPHEN 650 MG RE SUPP: 650.0000 mg | Freq: Four times a day (QID) | RECTAL | Status: DC | PRN

## 2017-04-02 MED ORDER — FENTANYL CITRATE (PF) 250 MCG/5ML IJ SOLN
INTRAMUSCULAR | Status: AC
  Filled 2017-04-02: qty 5

## 2017-04-02 MED ORDER — SODIUM CHLORIDE 0.9 % IV SOLN
INTRAVENOUS | Status: DC
  Administered 2017-04-02: 11:00:00 via INTRAVENOUS

## 2017-04-02 MED ORDER — LACTATED RINGERS IV SOLN
INTRAVENOUS | Status: DC | PRN
  Administered 2017-04-02: 07:00:00 via INTRAVENOUS

## 2017-04-02 MED ORDER — ACETAMINOPHEN 325 MG PO TABS
650.0000 mg | ORAL_TABLET | Freq: Four times a day (QID) | ORAL | Status: DC | PRN
  Administered 2017-04-04: 650 mg via ORAL
  Filled 2017-04-02: qty 2

## 2017-04-02 MED ORDER — PROPOFOL 10 MG/ML IV BOLUS
INTRAVENOUS | Status: AC
  Filled 2017-04-02: qty 20

## 2017-04-02 MED ORDER — POVIDONE-IODINE 10 % EX SWAB: 2.0000 "application " | Freq: Once | CUTANEOUS | Status: DC

## 2017-04-02 MED ORDER — HYDROCODONE-ACETAMINOPHEN 5-325 MG PO TABS
1.0000 | ORAL_TABLET | Freq: Four times a day (QID) | ORAL | Status: DC | PRN
  Administered 2017-04-02 – 2017-04-04 (×2): 2 via ORAL
  Filled 2017-04-02: qty 2

## 2017-04-02 MED ORDER — ASPIRIN EC 325 MG PO TBEC: 325.0000 mg | DELAYED_RELEASE_TABLET | Freq: Every day | ORAL | 0 refills | Status: DC

## 2017-04-02 MED ORDER — SODIUM CHLORIDE 0.9 % IR SOLN
Status: DC | PRN
  Administered 2017-04-02: 3000 mL

## 2017-04-02 MED ORDER — EPHEDRINE 5 MG/ML INJ
INTRAVENOUS | Status: AC
  Filled 2017-04-02: qty 10

## 2017-04-02 MED ORDER — PROPOFOL 10 MG/ML IV BOLUS
INTRAVENOUS | Status: DC | PRN
  Administered 2017-04-02: 100 mg via INTRAVENOUS

## 2017-04-02 MED ORDER — ACETAMINOPHEN 500 MG PO TABS: 500.0000 mg | ORAL_TABLET | Freq: Four times a day (QID) | ORAL | 0 refills | Status: AC | PRN

## 2017-04-02 MED ORDER — SUGAMMADEX SODIUM 200 MG/2ML IV SOLN
INTRAVENOUS | Status: DC | PRN
  Administered 2017-04-02: 169.6 mg via INTRAVENOUS

## 2017-04-02 MED ORDER — ONDANSETRON HCL 4 MG/2ML IJ SOLN: 4.0000 mg | Freq: Once | INTRAMUSCULAR | Status: DC | PRN

## 2017-04-02 MED ORDER — ONDANSETRON HCL 4 MG PO TABS: 4.0000 mg | ORAL_TABLET | Freq: Four times a day (QID) | ORAL | Status: DC | PRN

## 2017-04-02 MED ORDER — PHENOL 1.4 % MT LIQD: 1.0000 | OROMUCOSAL | Status: DC | PRN

## 2017-04-02 MED ORDER — ROCURONIUM BROMIDE 100 MG/10ML IV SOLN
INTRAVENOUS | Status: DC | PRN
  Administered 2017-04-02: 50 mg via INTRAVENOUS

## 2017-04-02 MED ORDER — CHLORHEXIDINE GLUCONATE 4 % EX LIQD
60.0000 mL | Freq: Once | CUTANEOUS | Status: AC
  Administered 2017-04-02: 4 via TOPICAL
  Filled 2017-04-02: qty 60

## 2017-04-02 MED ORDER — PHENYLEPHRINE HCL 10 MG/ML IJ SOLN
INTRAMUSCULAR | Status: DC | PRN
  Administered 2017-04-02 (×3): 80 ug via INTRAVENOUS

## 2017-04-02 MED ORDER — PHENYLEPHRINE HCL 10 MG/ML IJ SOLN
INTRAVENOUS | Status: DC | PRN
  Administered 2017-04-02: 15 ug/min via INTRAVENOUS

## 2017-04-02 MED ORDER — ASPIRIN EC 325 MG PO TBEC
325.0000 mg | DELAYED_RELEASE_TABLET | Freq: Every day | ORAL | Status: DC
  Administered 2017-04-03: 325 mg via ORAL
  Filled 2017-04-02 (×2): qty 1

## 2017-04-02 MED ORDER — PHENYLEPHRINE HCL 10 MG/ML IJ SOLN
INTRAMUSCULAR | Status: AC
  Filled 2017-04-02: qty 1

## 2017-04-02 MED ORDER — FENTANYL CITRATE (PF) 100 MCG/2ML IJ SOLN
INTRAMUSCULAR | Status: DC | PRN
  Administered 2017-04-02: 100 ug via INTRAVENOUS
  Administered 2017-04-02 (×2): 50 ug via INTRAVENOUS

## 2017-04-02 MED ORDER — APIXABAN 5 MG PO TABS
5.0000 mg | ORAL_TABLET | Freq: Two times a day (BID) | ORAL | Status: DC
  Administered 2017-04-03 – 2017-04-04 (×3): 5 mg via ORAL
  Filled 2017-04-02 (×3): qty 1

## 2017-04-02 MED ORDER — EPHEDRINE SULFATE 50 MG/ML IJ SOLN
INTRAMUSCULAR | Status: DC | PRN
  Administered 2017-04-02 (×2): 5 mg via INTRAVENOUS
  Administered 2017-04-02: 10 mg via INTRAVENOUS

## 2017-04-02 SURGICAL SUPPLY — 42 items
BLADE SAW SAG 73X25 THK (BLADE) ×2
BLADE SAW SGTL 73X25 THK (BLADE) ×1 IMPLANT
BRUSH FEMORAL CANAL (MISCELLANEOUS) IMPLANT
CAPT HIP HEMI 1 ×2 IMPLANT
COVER SURGICAL LIGHT HANDLE (MISCELLANEOUS) ×3 IMPLANT
DRAPE IMP U-DRAPE 54X76 (DRAPES) ×3 IMPLANT
DRAPE INCISE IOBAN 85X60 (DRAPES) ×6 IMPLANT
DRAPE ORTHO SPLIT 77X108 STRL (DRAPES) ×6
DRAPE SURG ORHT 6 SPLT 77X108 (DRAPES) ×2 IMPLANT
DRAPE U-SHAPE 47X51 STRL (DRAPES) ×3 IMPLANT
DRSG MEPILEX BORDER 4X4 (GAUZE/BANDAGES/DRESSINGS) ×2 IMPLANT
DRSG MEPILEX BORDER 4X8 (GAUZE/BANDAGES/DRESSINGS) ×4 IMPLANT
DURAPREP 26ML APPLICATOR (WOUND CARE) ×3 IMPLANT
ELECT BLADE 6.5 EXT (BLADE) IMPLANT
ELECT CAUTERY BLADE 6.4 (BLADE) IMPLANT
ELECT REM PT RETURN 9FT ADLT (ELECTROSURGICAL) ×3
ELECTRODE REM PT RTRN 9FT ADLT (ELECTROSURGICAL) ×1 IMPLANT
GAUZE SPONGE 4X4 12PLY STRL (GAUZE/BANDAGES/DRESSINGS) ×3 IMPLANT
GLOVE BIOGEL PI IND STRL 9 (GLOVE) ×1 IMPLANT
GLOVE BIOGEL PI INDICATOR 9 (GLOVE) ×2
GLOVE SURG ORTHO 9.0 STRL STRW (GLOVE) ×3 IMPLANT
GOWN STRL REUS W/ TWL XL LVL3 (GOWN DISPOSABLE) ×1 IMPLANT
GOWN STRL REUS W/TWL XL LVL3 (GOWN DISPOSABLE) ×3
HANDPIECE INTERPULSE COAX TIP (DISPOSABLE)
KIT BASIN OR (CUSTOM PROCEDURE TRAY) ×3 IMPLANT
KIT ROOM TURNOVER OR (KITS) ×3 IMPLANT
MANIFOLD NEPTUNE II (INSTRUMENTS) ×3 IMPLANT
NS IRRIG 1000ML POUR BTL (IV SOLUTION) ×3 IMPLANT
PACK TOTAL JOINT (CUSTOM PROCEDURE TRAY) ×3 IMPLANT
PACK UNIVERSAL I (CUSTOM PROCEDURE TRAY) ×3 IMPLANT
PAD ARMBOARD 7.5X6 YLW CONV (MISCELLANEOUS) ×6 IMPLANT
SET HNDPC FAN SPRY TIP SCT (DISPOSABLE) IMPLANT
STAPLER VISISTAT 35W (STAPLE) ×3 IMPLANT
SUT ETHIBOND NAB CT1 #1 30IN (SUTURE) ×3 IMPLANT
SUT VIC AB 1 CT1 27 (SUTURE) ×3
SUT VIC AB 1 CT1 27XBRD ANBCTR (SUTURE) ×1 IMPLANT
SUT VIC AB 2-0 CTB1 (SUTURE) ×3 IMPLANT
TOWEL OR 17X24 6PK STRL BLUE (TOWEL DISPOSABLE) ×3 IMPLANT
TOWEL OR 17X26 10 PK STRL BLUE (TOWEL DISPOSABLE) ×3 IMPLANT
TOWER CARTRIDGE SMART MIX (DISPOSABLE) IMPLANT
TRAY FOLEY W/METER SILVER 16FR (SET/KITS/TRAYS/PACK) IMPLANT
WATER STERILE IRR 1000ML POUR (IV SOLUTION) ×9 IMPLANT

## 2017-04-02 NOTE — H&P (View-Only) (Signed)
ORTHOPAEDIC CONSULTATION  REQUESTING PHYSICIAN: Sid Falcon, MD  Chief Complaint: Right hip pain.  HPI: Kristie [name] is a 83 y.o. female who presents with displaced right femoral neck fracture. Patient states she has an urinary tract infection she has to get up frequently to urinate and she fell sustaining a femoral neck fracture. Patient denies any head trauma or loss of consciousness.  Past Medical History:  Diagnosis Date  . Cataract   . Fibromyalgia 04/25/2014  . Hyperlipidemia   . Hypertension   . Lesion of sciatic nerve   . Low back pain   . Memory loss   . Spinal stenosis of lumbar region 04/25/2014  . Tremor   . Weakness    Past Surgical History:  Procedure Laterality Date  . ABDOMINAL HYSTERECTOMY  1963  . APPENDECTOMY    . CATARACT EXTRACTION W/ INTRAOCULAR LENS  IMPLANT, BILATERAL  2005 and 2007  . COLON SURGERY  1974   colon polyp  . COLON SURGERY  2002   colon polyps(2)  Dr Collene Mares  . COLONOSCOPY W/ POLYPECTOMY  1974and 2002  . EYE SURGERY  2005/2007   cataract renoval  Dr. Bing Plume  . KNEE SURGERY  2009   Dr. Ronnie Derby  . LUMBAR LAMINECTOMY  2012   L4-5 with spinous process fixation (06/22/11) Dr. Tommi Emery  . schwanoma  1987   residual right foot drop and right leg pain. Dr. Deirdre Pippins  . SMALL INTESTINE SURGERY  1990   bowel obstruction, Dr. Rebekah Chesterfield  . TONSILLECTOMY  1940   Social History   Social History  . Marital status: Married    Spouse name: N/A  . Number of children: 4  . Years of education: N/A   Occupational History  . retired Public relations account executive  Retired    retired   Social History Main Topics  . Smoking status: Former Smoker    Quit date: 04/25/1974  . Smokeless tobacco: Never Used     Comment: Quit thirty years ago.  . Alcohol use Yes  . Drug use: No  . Sexual activity: Not Asked   Other Topics Concern  . 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 She drinks 1 cup of coffee per day.    She does not have any pets.   Former smoker for 20 years   Exercise none                         Family History  Problem Relation Age of Onset  . Breast cancer Mother   . Heart disease Mother   . Dementia Father   . Cancer Sister        Esophageal    - negative except otherwise stated in the family history section Allergies  Allergen Reactions  . Restoril [Temazepam] Other (See Comments)    Nightmares  . Bactrim [Sulfamethoxazole-Trimethoprim] Nausea And Vomiting   Prior to Admission medications   Medication Sig Start Date End Date Taking? Authorizing Provider  acetaminophen (TYLENOL) 500 MG tablet Take 1,000 mg by mouth at bedtime.    Yes [provider]  amLODipine (NORVASC) 10 MG tablet Take 1 tablet (10 mg total) by mouth daily. 08/16/16  Yes Mast, Man X, NP  apixaban (ELIQUIS) 5 MG TABS tablet Take 1 tablet (5 mg total) by mouth 2 (two)  times daily. 03/19/17  Yes Sheikh, Omair Latif, DO  aspirin 81 MG chewable tablet Chew 81 mg by mouth daily.   Yes [provider]  cholecalciferol (VITAMIN D) 1000 UNITS tablet Take 2,000 Units by mouth daily.    Yes [provider]  diclofenac sodium (VOLTAREN) 1 % GEL APPLY 4 GRAMS TO 3 LARGE JOINTS UP TO 3 TIMES A DAY AS NEEDED Patient taking differently: APPLY 4 GRAMS TO 3 LARGE JOINTS UP TO 3 TIMES A DAY AS NEEDED for pain 12/29/16  Yes Panwala, Naitik, PA-C  donepezil (ARICEPT) 10 MG tablet Take 10 mg by mouth 2 (two) times daily.   Yes [provider]  eszopiclone (LUNESTA) 1 MG TABS tablet Take 1 mg by mouth at bedtime. Take immediately before bedtime   Yes [provider]  fluticasone (FLONASE) 50 MCG/ACT nasal spray Place 2 sprays into both nostrils daily. 2 sprays in each nostril once daily 08/16/16  Yes Mast, Man X, NP  gabapentin (NEURONTIN) 800 MG tablet TAKE 1 TABLET 3 TIMES A DAY. Patient taking  differently: TAKE 1 TABLET (800mg ) 3 TIMES A DAY. 01/31/17  Yes Mast, Man X, NP  Lactobacillus (PROBIOTIC ACIDOPHILUS PO) Take 1 capsule by mouth daily.   Yes [provider]  losartan (COZAAR) 50 MG tablet Take 1 tablet (50 mg total) by mouth daily. 08/16/16  Yes Mast, Man X, NP  metoprolol tartrate (LOPRESSOR) 25 MG tablet TAKE 1 TABLET TWICE DAILY TO CONTROL BLOOD PRESSURE. Patient taking differently: TAKE 1 TABLET (25mg ) TWICE DAILY TO CONTROL BLOOD PRESSURE. 12/27/16  Yes Estill Dooms, MD  mirabegron ER (MYRBETRIQ) 25 MG TB24 tablet Take 25 mg by mouth daily.   Yes [provider]  sertraline (ZOLOFT) 50 MG tablet TAKE 1 TABLET EACH DAY. 03/08/17  Yes Pandey, Mahima, MD  simvastatin (ZOCOR) 10 MG tablet Take 1 tablet (10 mg total) by mouth every other day. 12/03/16  Yes Estill Dooms, MD  solifenacin (VESICARE) 10 MG tablet Take 1 tablet (10 mg total) by mouth daily. 02/09/17  Yes Eulas Post, Monica, DO  sulfamethoxazole-trimethoprim (BACTRIM DS,SEPTRA DS) 800-160 MG tablet Take 1 tablet by mouth 2 (two) times daily. 03/31/17 04/05/17 Yes [provider]  zolpidem (AMBIEN) 10 MG tablet TAKE 1 TABLET AT BEDTIME AS NEEDED. Patient taking differently: TAKE 1 TABLET (10mg ) AT BEDTIME AS NEEDED if woke up in the middle of the night. 03/29/17  Yes Blanchie Serve, MD  apixaban (ELIQUIS) 5 MG TABS tablet Take 2 tablets (10 mg total) by mouth 2 (two) times daily. Patient not taking: Reported on 04/01/2017 03/12/17 04/01/17  Raiford Noble Latif, DO  cefpodoxime (VANTIN) 200 MG tablet Take 1 tablet (200 mg total) by mouth 2 (two) times daily. Patient not taking: Reported on 04/01/2017 03/12/17   Raiford Noble Latif, DO  guaiFENesin (MUCINEX) 600 MG 12 hr tablet Take 2 tablets (1,200 mg total) by mouth 2 (two) times daily. Patient not taking: Reported on 04/01/2017 03/12/17   Kerney Elbe, DO   Dg Chest 1 View  Result Date: 04/01/2017 CLINICAL DATA:  Fall.  Right hip fracture. EXAM: CHEST 1  VIEW COMPARISON:  CT chest and chest 03/10/2017 FINDINGS: Heart size and pulmonary vascularity are normal. Shallow inspiration with atelectasis in the right lung base. Nodular opacity in the left apex measuring 1.9 cm diameter. This was present on previous CT chest 03/10/2017 and may be decreasing in size. Follow-up CT or PET-CT scan 3 months after the previous CT remains indicated. No  airspace disease or consolidation. No blunting of costophrenic angles. No pneumothorax. Calcified and tortuous aorta. IMPRESSION: Shallow inspiration with slight atelectasis in the right lung base. No evidence of active pulmonary disease. Left apical nodule measuring 19 mm may be decreasing since a previous CT scan. Follow-up recommended. Electronically Signed   By: Lucienne Capers M.D.   On: 04/01/2017 06:15   Dg Hip Unilat W Or Wo Pelvis 2-3 Views Right  Result Date: 04/01/2017 CLINICAL DATA:  Right hip pain after a fall. EXAM: DG HIP (WITH OR WITHOUT PELVIS) 2-3V RIGHT COMPARISON:  None. FINDINGS: Acute fracture of the right femoral neck with superior subluxation of the distal fracture fragment resulting in varus angulation. No definite extension into the inter trochanteric region. No glenohumeral dislocation. Fractures of the superior and inferior right pubic rami. Sclerosis and irregularity of the right iliac wing could indicate fracture deformity or possibly bone graft donor site. This is incompletely included within the field of view. Consider CT for further evaluation clinically indicated. Postoperative changes noted in the lower lumbar spine. Surgical clips in the right pelvis. IMPRESSION: Acute fracture of the right femoral neck with varus angulation. Fractures of the right superior and inferior pubic rami. Possible fracture versus bone graft donor site in the right iliac wing. Electronically Signed   By: Lucienne Capers M.D.   On: 04/01/2017 06:11   - pertinent xrays, CT, MRI studies were reviewed and independently  interpreted  Positive ROS: All other systems have been reviewed and were otherwise negative with the exception of those mentioned in the HPI and as above.  Physical Exam: General: Alert, no acute distress Psychiatric: Patient is competent for consent with normal mood and affect Lymphatic: No axillary or cervical lymphadenopathy Cardiovascular: No pedal edema Respiratory: No cyanosis, no use of accessory musculature GI: No organomegaly, abdomen is soft and non-tender  Skin: Skin is intact no ecchymosis and bruising.   Neurologic: Patient does  have protective sensation bilateral lower extremities.   MUSCULOSKELETAL:  Examination patient is alert and oriented no adenopathy well-dressed her right lower extremity is shortened and externally rotated she has pain with range of motion of the hip. Her foot is better vascular intact. Does have a foot drop on the right which is secondary to previous spinal surgery. Review of the radiographs shows a displaced femoral neck fracture. She has an old pubic rami fractures well.  Assessment: Assessment: Acute right femoral neck fracture.  Plan: Plan: We will plan for right hip hemiarthroplasty tomorrow Saturday morning at 7:30. Risks and benefits were discussed including infection dislocation DVT need for additional surgery. Patient states she understands wish to proceed at this time. Discussed with patient she will need skilled nursing placement postoperatively.  Thank you for the consult and the opportunity to see Ms. Larkin Ina, MD [facility] [phone] 12:37 PM

## 2017-04-02 NOTE — Progress Notes (Signed)
PROGRESS NOTE    Kristie [name]  XBL:[id number] DOB: Nov 09, 1928 DOA: 04/01/2017 PCP: Blanchie Serve, MD   Chief Complaint  Patient presents with  . Fall  . Right Hip Pain    Brief Narrative:  HPI On 04/01/2017 by Dr. Gilles Chiquito Kristie [name] is a 83 y.o. 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  She complains of significant pain in the right hip, unable to get up and move her legs. Other than pain medication, nothing relieves the symptoms or aggravates them. The patient denies any loss of consciousness, headache, or neck pain. She denies any vision changes. No confusion is repoted. She denies any shortness of breath or chest pain. She denies any abdominal pain.No dysuria or gross hematuria, but she does has chronic incontinence  She has chronic back pain, but this is not worse from prior. Assessment & Plan   Right femoral neck fracture -Status post fall -X-ray showed acute fracture of the right femoral neck with varus angulation. Fractures of the right superior and inferior pubic rami. Possible fracture versus bone graft donor site in the right iliac wing. -Orthopedic consult and appreciated -Status post surgery -Continue pain control -PT and OT will be consulted on 04/03/2017 -Patient resides in independent living at [facility]  History of PE and DVT -In August 2018, was on Eliquis prior to admission -Has been placed on heparin due to surgery  Chronic back pain/fibromyalgia -Continue Neurontin, and pain control  Essential hypertension -Continue Cozaar, metoprolol, Norvasc  Hyperlipidemia -Continue statin  Mild dementia -Stable, continue  Aricept  Depression -Continue Zoloft  Urge incontinence -Continue Vesicare  Insomnia -Continue Ambien  UTI -prior to admission, was placed on bactrim -Continue bactrim, monitor renal function  DVT Prophylaxis  heparin  Code Status: DNR  Family Communication: husband at bedside  Disposition Plan: Admitted. Pending PT/OT evaluations, Dispo TBD  Consultants Orthopedic surgery, Dr. Sharol Given  Procedures  Hemiarthroplasty femoral neck fracture  Antibiotics   Anti-infectives    Start     Dose/Rate Route Frequency Ordered Stop   04/02/17 1600  ceFAZolin (ANCEF) IVPB 2g/100 mL premix     2 g 200 mL/hr over 30 Minutes Intravenous Every 6 hours 04/02/17 1036 04/03/17 0359   04/02/17 0600  ceFAZolin (ANCEF) IVPB 2g/100 mL premix     2 g 200 mL/hr over 30 Minutes Intravenous On call to O.R. 04/02/17 0101 04/02/17 0800   04/01/17 1000  sulfamethoxazole-trimethoprim (BACTRIM DS,SEPTRA DS) 800-160 MG per tablet 1 tablet     1 tablet Oral 2 times daily 04/01/17 3007        Subjective:   Kristie [name] seen and examined today.  She states she feels much better since having surgery. Feels very sleepy at this time. Denies chest pain, shortness breath, abdominal pain, nausea or vomiting, diarrhea or constipation.  Objective:   Vitals:   04/02/17 0937 04/02/17 0952 04/02/17 1025 04/02/17 1333  BP: 133/79 132/88 122/65 117/82  Pulse: 83 74 77 89  Resp: 10 17 16 17   Temp:  (!) 97 F (36.1 C) 97.7 F (36.5 C) 98.3 F (36.8 C)  TempSrc:   Axillary Axillary  SpO2: 96% 96% 95% 91%  Weight:  Height:        Intake/Output Summary (Last 24 hours) at 04/02/17 1525 Last data filed at 04/02/17 1334  Gross per 24 hour  Intake             1570 ml  Output              750 ml  Net              820 ml   Filed Weights   04/01/17 0525  Weight: 84.8 kg (187 lb)    Exam  General: Well developed, well nourished, NAD, appears stated age  HEENT: NCAT, mucous membranes moist.    Cardiovascular: S1 S2 auscultated, no rubs, murmurs or gallops. Regular rate and rhythm.  Respiratory: Clear to auscultation bilaterally with equal chest rise  Abdomen: Soft, nontender, nondistended, + bowel sounds  Extremities: warm dry without cyanosis clubbing or edema. TTP of the right hip.  Neuro: AAOx3, Nonfocal  Psych: Normal affect and demeanor with intact judgement and insight   Data Reviewed: I have personally reviewed following labs and imaging studies  CBC:  Recent Labs Lab 04/01/17 0530 04/01/17 0536 04/02/17 0406  WBC 9.7  --  8.4  NEUTROABS 6.8  --   --   HGB 15.0 15.3* 12.8  HCT 45.7 45.0 39.8  MCV 94.6  --  94.3  PLT 205  --  299   Basic Metabolic Panel:  Recent Labs Lab 04/01/17 0530 04/01/17 0536 04/02/17 0406  NA 139 141 135  K 4.3 4.3 4.1  CL 105 103 104  CO2 27  --  26  GLUCOSE 116* 110* 119*  BUN 16 19 15   CREATININE 0.84 0.80 0.68  CALCIUM 9.4  --  8.7*   GFR: Estimated Creatinine Clearance: 57.6 mL/min (by C-G formula based on SCr of 0.68 mg/dL). Liver Function Tests: No results for input(s): AST, ALT, ALKPHOS, BILITOT, PROT, ALBUMIN in the last 168 hours. No results for input(s): LIPASE, AMYLASE in the last 168 hours. No results for input(s): AMMONIA in the last 168 hours. Coagulation Profile:  Recent Labs Lab 04/01/17 0530  INR 1.00   Cardiac Enzymes: No results for input(s): CKTOTAL, CKMB, CKMBINDEX, TROPONINI in the last 168 hours. BNP (last 3 results) No results for input(s): PROBNP in the last 8760 hours. HbA1C: No results for input(s): HGBA1C in the last 72 hours. CBG: No results for input(s): GLUCAP in the last 168 hours. Lipid Profile: No results for input(s): CHOL, HDL, LDLCALC, TRIG, CHOLHDL, LDLDIRECT in the last 72 hours. Thyroid Function Tests: No results for input(s): TSH, T4TOTAL, FREET4, T3FREE, THYROIDAB in the last 72 hours. Anemia Panel: No results for input(s): VITAMINB12, FOLATE, FERRITIN, TIBC,  IRON, RETICCTPCT in the last 72 hours. Urine analysis:    Component Value Date/Time   COLORURINE YELLOW 04/01/2017 1857   APPEARANCEUR CLEAR 04/01/2017 1857   LABSPEC 1.010 04/01/2017 1857   PHURINE 7.0 04/01/2017 1857   GLUCOSEU NEGATIVE 04/01/2017 1857   HGBUR NEGATIVE 04/01/2017 1857   BILIRUBINUR NEGATIVE 04/01/2017 1857   KETONESUR NEGATIVE 04/01/2017 1857   PROTEINUR NEGATIVE 04/01/2017 1857   UROBILINOGEN 0.2 04/15/2012 0720   NITRITE NEGATIVE 04/01/2017 1857   LEUKOCYTESUR MODERATE (A) 04/01/2017 1857   Sepsis Labs: @LABRCNTIP (procalcitonin:4,lacticidven:4)  ) Recent Results (from the past 240 hour(s))  Culture, blood (Routine X 2) w Reflex to ID Panel     Status: None (Preliminary result)   Collection Time: 04/01/17  8:30 AM  Result Value Ref Range Status  Specimen Description BLOOD BLOOD RIGHT HAND  Final   Special Requests   Final    BOTTLES DRAWN AEROBIC AND ANAEROBIC Blood Culture adequate volume   Culture PENDING  Incomplete   Report Status PENDING  Incomplete  Culture, blood (Routine X 2) w Reflex to ID Panel     Status: None (Preliminary result)   Collection Time: 04/01/17  8:30 AM  Result Value Ref Range Status   Specimen Description BLOOD RIGHT ANTECUBITAL  Final   Special Requests IN PEDIATRIC BOTTLE Blood Culture adequate volume  Final   Culture PENDING  Incomplete   Report Status PENDING  Incomplete  Surgical pcr screen     Status: None   Collection Time: 04/01/17  9:44 PM  Result Value Ref Range Status   MRSA, PCR NEGATIVE NEGATIVE Final   Staphylococcus aureus NEGATIVE NEGATIVE Final    Comment: (NOTE) The Xpert SA Assay (FDA approved for NASAL specimens in patients 40 years of age and older), is one component of a comprehensive surveillance program. It is not intended to diagnose infection nor to guide or monitor treatment.       Radiology Studies: Dg Chest 1 View  Result Date: 04/01/2017 CLINICAL DATA:  Fall.  Right hip fracture. EXAM:  CHEST 1 VIEW COMPARISON:  CT chest and chest 03/10/2017 FINDINGS: Heart size and pulmonary vascularity are normal. Shallow inspiration with atelectasis in the right lung base. Nodular opacity in the left apex measuring 1.9 cm diameter. This was present on previous CT chest 03/10/2017 and may be decreasing in size. Follow-up CT or PET-CT scan 3 months after the previous CT remains indicated. No airspace disease or consolidation. No blunting of costophrenic angles. No pneumothorax. Calcified and tortuous aorta. IMPRESSION: Shallow inspiration with slight atelectasis in the right lung base. No evidence of active pulmonary disease. Left apical nodule measuring 19 mm may be decreasing since a previous CT scan. Follow-up recommended. Electronically Signed   By: Lucienne Capers M.D.   On: 04/01/2017 06:15   Dg Hip Unilat W Or Wo Pelvis 2-3 Views Right  Result Date: 04/01/2017 CLINICAL DATA:  Right hip pain after a fall. EXAM: DG HIP (WITH OR WITHOUT PELVIS) 2-3V RIGHT COMPARISON:  None. FINDINGS: Acute fracture of the right femoral neck with superior subluxation of the distal fracture fragment resulting in varus angulation. No definite extension into the inter trochanteric region. No glenohumeral dislocation. Fractures of the superior and inferior right pubic rami. Sclerosis and irregularity of the right iliac wing could indicate fracture deformity or possibly bone graft donor site. This is incompletely included within the field of view. Consider CT for further evaluation clinically indicated. Postoperative changes noted in the lower lumbar spine. Surgical clips in the right pelvis. IMPRESSION: Acute fracture of the right femoral neck with varus angulation. Fractures of the right superior and inferior pubic rami. Possible fracture versus bone graft donor site in the right iliac wing. Electronically Signed   By: Lucienne Capers M.D.   On: 04/01/2017 06:11     Scheduled Meds: . acetaminophen  1,000 mg Oral Once  .  acetaminophen  1,000 mg Oral QHS  . amLODipine  10 mg Oral Daily  . [START ON 04/03/2017] apixaban  5 mg Oral BID  . [START ON 04/03/2017] aspirin EC  325 mg Oral Q breakfast  . cholecalciferol  2,000 Units Oral Daily  . donepezil  10 mg Oral BID  . fluticasone  2 spray Each Nare Daily  . gabapentin  800 mg Oral TID  .  losartan  50 mg Oral Daily  . metoprolol tartrate  25 mg Oral BID  . mirabegron ER  25 mg Oral Daily  . sertraline  50 mg Oral Daily  . simvastatin  10 mg Oral Q48H  . sulfamethoxazole-trimethoprim  1 tablet Oral BID   Continuous Infusions: . sodium chloride Stopped (04/01/17 0739)  . sodium chloride 10 mL/hr at 04/02/17 1055  .  ceFAZolin (ANCEF) IV       LOS: 1 day   Time Spent in minutes   30 minutes  Lashona Schaaf D.O. on 04/02/2017 at 3:25 PM  Between 7am to 7pm - Pager - [phone]  After 7pm go to [email or web address] - password [email or web address]  And look for the night coverage person covering for me after hours  [facility] Office  [phone]

## 2017-04-02 NOTE — Progress Notes (Signed)
Patient to the OR.

## 2017-04-02 NOTE — Anesthesia Postprocedure Evaluation (Signed)
Anesthesia Post Note  Patient: Kristie [name]  Procedure(s) Performed: Procedure(s) (LRB): ARTHROPLASTY BIPOLAR HIP (HEMIARTHROPLASTY) (Right)     Patient location during evaluation: PACU Anesthesia Type: General Level of consciousness: awake and alert Pain management: pain level controlled Vital Signs Assessment: post-procedure vital signs reviewed and stable Respiratory status: spontaneous breathing, nonlabored ventilation, respiratory function stable and patient connected to nasal cannula oxygen Cardiovascular status: blood pressure returned to baseline and stable Postop Assessment: no signs of nausea or vomiting Anesthetic complications: no    Last Vitals:  Vitals:   04/02/17 0952 04/02/17 1025  BP: 132/88 122/65  Pulse: 74 77  Resp: 17 16  Temp: (!) 36.1 C 36.5 C  SpO2: 96% 95%    Last Pain:  Vitals:   04/02/17 1025  TempSrc: Axillary  PainSc:                  Kristie [name]

## 2017-04-02 NOTE — Transfer of Care (Signed)
Immediate Anesthesia Transfer of Care Note  Patient: Kristie [name]  Procedure(s) Performed: Procedure(s): ARTHROPLASTY BIPOLAR HIP (HEMIARTHROPLASTY) (Right)  Patient Location: PACU  Anesthesia Type:General  Level of Consciousness: awake, alert  and oriented  Airway & Oxygen Therapy: Patient Spontanous Breathing and Patient connected to nasal cannula oxygen  Post-op Assessment: Report given to RN and Post -op Vital signs reviewed and stable  Post vital signs: Reviewed and stable  Last Vitals:  Vitals:   04/02/17 0921 04/02/17 0922  BP:  (!) 164/88  Pulse: 94 86  Resp: 12 10  Temp:    SpO2: 92% 94%    Last Pain:  Vitals:   04/02/17 0455  TempSrc: Oral  PainSc:          Complications: No apparent anesthesia complications

## 2017-04-02 NOTE — Progress Notes (Signed)
Dr. Sharol Given and Dr. Roanna Banning made aware that heparin was stopped just prior to arrival to short stay and aPTT results.

## 2017-04-02 NOTE — Anesthesia Procedure Notes (Signed)
Procedure Name: Intubation Date/Time: 04/02/2017 7:56 AM Performed by: Clearnce Sorrel Pre-anesthesia Checklist: Patient identified, Emergency Drugs available, Suction available, Patient being monitored and Timeout performed Patient Re-evaluated:Patient Re-evaluated prior to induction Oxygen Delivery Method: Circle system utilized Preoxygenation: Pre-oxygenation with 100% oxygen Induction Type: IV induction Ventilation: Mask ventilation without difficulty and Oral airway inserted - appropriate to patient size Laryngoscope Size: Mac and 3 Grade View: Grade I Tube type: Oral Tube size: 7.0 mm Number of attempts: 1 Airway Equipment and Method: Stylet Placement Confirmation: ETT inserted through vocal cords under direct vision,  positive ETCO2 and breath sounds checked- equal and bilateral Secured at: 22 cm Tube secured with: Tape Dental Injury: Teeth and Oropharynx as per pre-operative assessment

## 2017-04-02 NOTE — Progress Notes (Signed)
ANTICOAGULATION CONSULT NOTE - Follow Up Consult  Pharmacy Consult for hepaqrin Indication: pulmonary embolus  Labs:  Recent Labs  04/01/17 0530 04/01/17 0536 04/01/17 1614 04/02/17 0406  HGB 15.0 15.3*  --  12.8  HCT 45.7 45.0  --  39.8  PLT 205  --   --  177  APTT 37*  --  85* 130*  LABPROT 13.1  --   --   --   INR 1.00  --   --   --   HEPARINUNFRC  --   --   --  1.10*  CREATININE 0.84 0.80  --  0.68    Assessment: 83yo female above goal on heparin after one PTT at goal; pt going to OR this am.  Goal of Therapy:  aPTT 66-102 seconds   Plan:  Will decrease heparin gtt slightly to 1100 units/hr until off for OR and f/u after OR to resume.  Wynona Neat, PharmD, BCPS  04/02/2017,6:15 AM

## 2017-04-02 NOTE — Op Note (Signed)
Date of Surgery: 04/02/2017  INDICATIONS: Kristie [name] is a 83 y.o.-year-old female who fell at home going to the bathroom. Patient sustained a mechanical fall no loss of consciousness no syncopal event.Marland Kitchen  PREOPERATIVE DIAGNOSIS: Right femoral neck fracture  POSTOPERATIVE DIAGNOSIS: Same.  PROCEDURE: Hemiarthroplasty femoral neck fracture   SURGEON: Sharol Given, M.D.  ANESTHESIA:  general  IV FLUIDS AND URINE: See anesthesia.  ESTIMATED BLOOD LOSS: Minimal mL.  COMPLICATIONS: None.  COMPONENT SIZES: De[place]w size 4 press-fit Summit stem size 46 ball +0 neck  DESCRIPTION OF PROCEDURE: The patient was brought to the operating room and underwent a general anesthetic. After adequate levels of anesthesia were obtained patient was placed in the lateral decubitus position with the hip fracture side up and axillary roll was placed the arms were padded and the mark II supports were placed. The lower extremity was prepped using DuraPrep draped into a sterile field Ioban was used to cover all exposed skin. A timeout was called. A posterior lateral incision was made this was carried down through the tensor fascia lata which was split. Retractors were placed. The piriformis short external rotators and capsule was incised off the femoral neck and retracted with Ethibond suture. The femoral neck cut was made 1 cm proximal to the calcar. The head was delivered. The head was sized and the appropriate size had a good suction fit. The femoral canal was sequentially broached to the proper size. The hip was trialed and this had a good stable fit. The wound was irrigated with normal saline. The femoral component and head were inserted this was again reduced. The hip was stable. The wound was irrigated with normal saline. The capsule short external rotators and piriformis were reapproximated with #1 Ethibond. The tensor fascia lata was closed using #1 Vicryl. Subcutaneous was closed using 0 Vicryl the skin was closed using staples.  A Mepilex dressing was applied. Patient was extubated taken to the PACU in stable condition. Kristie Score, MD [place] 9:13 AM

## 2017-04-02 NOTE — Interval H&P Note (Signed)
History and Physical Interval Note:  04/02/2017 7:36 AM  Kristie [name]  has presented today for surgery, with the diagnosis of Right Femoral Neck Fracture  The various methods of treatment have been discussed with the patient and family. After consideration of risks, benefits and other options for treatment, the patient has consented to  Procedure(s): ARTHROPLASTY BIPOLAR HIP (HEMIARTHROPLASTY) (Right) as a surgical intervention .  The patient's history has been reviewed, patient examined, no change in status, stable for surgery.  I have reviewed the patient's chart and labs.  Questions were answered to the patient's satisfaction.     Newt Minion

## 2017-04-03 DIAGNOSIS — N3946 Mixed incontinence: Secondary | ICD-10-CM

## 2017-04-03 DIAGNOSIS — I2699 Other pulmonary embolism without acute cor pulmonale: Secondary | ICD-10-CM

## 2017-04-03 DIAGNOSIS — M19042 Primary osteoarthritis, left hand: Secondary | ICD-10-CM

## 2017-04-03 DIAGNOSIS — M19041 Primary osteoarthritis, right hand: Secondary | ICD-10-CM

## 2017-04-03 LAB — BASIC METABOLIC PANEL
Anion gap: 7 (ref 5–15)
BUN: 14 mg/dL (ref 6–20)
CALCIUM: 9 mg/dL (ref 8.9–10.3)
CO2: 27 mmol/L (ref 22–32)
CREATININE: 0.63 mg/dL (ref 0.44–1.00)
Chloride: 104 mmol/L (ref 101–111)
Glucose, Bld: 117 mg/dL — ABNORMAL HIGH (ref 65–99)
Potassium: 4.8 mmol/L (ref 3.5–5.1)
SODIUM: 138 mmol/L (ref 135–145)

## 2017-04-03 LAB — CBC
HCT: 37.8 % (ref 36.0–46.0)
Hemoglobin: 12 g/dL (ref 12.0–15.0)
MCH: 30.1 pg (ref 26.0–34.0)
MCHC: 31.7 g/dL (ref 30.0–36.0)
MCV: 94.7 fL (ref 78.0–100.0)
PLATELETS: 167 10*3/uL (ref 150–400)
RBC: 3.99 MIL/uL (ref 3.87–5.11)
RDW: 14.2 % (ref 11.5–15.5)
WBC: 10 10*3/uL (ref 4.0–10.5)

## 2017-04-03 LAB — URINE CULTURE

## 2017-04-03 NOTE — Evaluation (Signed)
Occupational Therapy Evaluation Patient Details Name: Kristie [name] MRN: [id number] DOB: May 30, 1929 Today's Date: 04/03/2017    History of Present Illness 83 y.o. female with medical history significant for HTN, HLD, chronic back pain, fibromyalgia, insomnia, depression, spinal stenosis of the lumbar area, chronic generalized weakness, mild memory loss, recent UTI, on Bactrim, as well as recent history of PE from DVT. Pt fell and is now s/p ARTHROPLASTY BIPOLAR HIP (HEMIARTHROPLASTY) (Right).   Clinical Impression   Pt s/p above. Pt independent with ADLs, PTA. Feel pt will benefit from acute OT to increase independence prior to d/c. Recommending SNF.     Follow Up Recommendations  SNF    Equipment Recommendations  Other (comment) (defer to next venue)    Recommendations for Other Services       Precautions / Restrictions Precautions Precautions: Fall;Posterior Hip Precaution Booklet Issued: No Precaution Comments: pt was educated on hip precautions during session. Required Braces or Orthoses: Other Brace/Splint (pt has AFO for right foot) Restrictions Weight Bearing Restrictions: Yes RLE Weight Bearing: Weight bearing as tolerated      Mobility Bed Mobility Overal bed mobility: Needs Assistance Bed Mobility: Supine to Sit     Supine to sit: Min assist     General bed mobility comments: assist with RLE.  Transfers Overall transfer level: Needs assistance Equipment used: Rolling walker (2 wheeled) Transfers: Sit to/from Stand Sit to Stand: +2 physical assistance;Mod assist         General transfer comment: cues given for technique.    Balance    +2 Min A for ambulation with RW.                                       ADL either performed or assessed with clinical judgement   ADL Overall ADL's : Needs assistance/impaired                     Lower Body Dressing: Maximal assistance;Sit to/from stand;+2 for physical assistance   Toilet  Transfer: +2 for physical assistance;Moderate assistance Toilet Transfer Details (indicate cue type and reason): sit to stand from bed         Functional mobility during ADLs: Rolling walker;+2 for physical assistance;Moderate assistance General ADL Comments: Briefly talked about AE to assist with LB ADLs.     Vision         Perception     Praxis      Pertinent Vitals/Pain Pain Assessment: Faces Faces Pain Scale: Hurts little more Pain Location: Rt hip Pain Descriptors / Indicators: Sharp Pain Intervention(s): Monitored during session;Repositioned     Hand Dominance     Extremity/Trunk Assessment Upper Extremity Assessment Upper Extremity Assessment: Overall WFL for tasks assessed   Lower Extremity Assessment Lower Extremity Assessment: Defer to PT evaluation       Communication Communication Communication: No difficulties   Cognition Arousal/Alertness: Awake/alert Behavior During Therapy: WFL for tasks assessed/performed Overall Cognitive Status: Within Functional Limits for tasks assessed                                     General Comments       Exercises     Shoulder Instructions      Home Living Family/patient expects to be discharged to:: Skilled nursing facility Living Arrangements: Spouse/significant other Available Help  at Discharge: Family;Available 24 hours/day Type of Home: Independent living facility Home Access: Level entry     Home Layout: One level     Bathroom Shower/Tub: Walk-in shower         Home Equipment: Kasandra Knudsen - single point;Walker - 4 wheels;Wheelchair - manual;Grab bars - tub/shower;Grab bars - toilet;Shower seat - built Investment banker, operational (has an additional walker as well) Adaptive Equipment: Long-handled shoe horn (may have reacher?)        Prior Functioning/Environment Level of Independence: Independent with assistive device(s)        Comments: walks with R AFO and rollator        OT  Problem List: Decreased strength;Pain;Decreased activity tolerance;Impaired balance (sitting and/or standing);Decreased knowledge of use of DME or AE;Decreased knowledge of precautions      OT Treatment/Interventions: Self-care/ADL training;DME and/or AE instruction;Therapeutic activities;Patient/family education;Balance training    OT Goals(Current goals can be found in the care plan section) Acute Rehab OT Goals Patient Stated Goal: wanted to walk OT Goal Formulation: With patient Time For Goal Achievement: May 10, 2017 Potential to Achieve Goals: Good ADL Goals Pt Will Perform Lower Body Dressing: with min assist;with adaptive equipment;sit to/from stand Pt Will Transfer to Toilet: with min guard assist;ambulating;bedside commode Pt Will Perform Toileting - Clothing Manipulation and hygiene: with min guard assist;sit to/from stand Additional ADL Goal #1: Pt will independently verbalize 3/3 hip precautions.   OT Frequency: Min 2X/week   Barriers to D/C:            Co-evaluation PT/OT/SLP Co-Evaluation/Treatment: Yes Reason for Co-Treatment: For patient/therapist safety   OT goals addressed during session: ADL's and self-care;Other (comment) (mobility)      AM-PAC PT "6 Clicks" Daily Activity     Outcome Measure Help from another person eating meals?: None Help from another person taking care of personal grooming?: A Little Help from another person toileting, which includes using toliet, bedpan, or urinal?: A Lot Help from another person bathing (including washing, rinsing, drying)?: A Lot Help from another person to put on and taking off regular upper body clothing?: A Little Help from another person to put on and taking off regular lower body clothing?: A Lot 6 Click Score: 16   End of Session Equipment Utilized During Treatment: Rolling walker (Oxygen placed on pt at end of session) Nurse Communication: Mobility status;Other (comment) (catheter )  Activity Tolerance: Patient  tolerated treatment well Patient left: in chair;with call bell/phone within reach;with family/visitor present  OT Visit Diagnosis: Unsteadiness on feet (R26.81)                Time: 9935-7017 OT Time Calculation (min): 23 min Charges:  OT General Charges $OT Visit: 1 Visit OT Evaluation $OT Eval Moderate Complexity: 1 Mod G-Codes:      Abrea Henle L Samule Life OTR/L 04/03/2017, 10:23 AM

## 2017-04-03 NOTE — Progress Notes (Addendum)
PROGRESS NOTE    Kristie [name]  XBD:[id number] DOB: 1929/05/08 DOA: 04/01/2017 PCP: Blanchie Serve, MD   Chief Complaint  Patient presents with  . Fall  . Right Hip Pain    Brief Narrative:  HPI On 04/01/2017 by Dr. Gilles Chiquito Kristie [name] is a 83 y.o. 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  She complains of significant pain in the right hip, unable to get up and move her legs. Other than pain medication, nothing relieves the symptoms or aggravates them. The patient denies any loss of consciousness, headache, or neck pain. She denies any vision changes. No confusion is repoted. She denies any shortness of breath or chest pain. She denies any abdominal pain.No dysuria or gross hematuria, but she does has chronic incontinence  She has chronic back pain, but this is not worse from prior. Assessment & Plan   Right femoral neck fracture -Status post fall -X-ray showed acute fracture of the right femoral neck with varus angulation. Fractures of the right superior and inferior pubic rami. Possible fracture versus bone graft donor site in the right iliac wing. -Orthopedic consult and appreciated -Status post surgery -Continue pain control -PT and OT recommended SNF -Patient resides in independent living [facility]  History of PE and DVT -In August 2018, was on Eliquis prior to admission -Eliquis restarted today  Chronic back pain/fibromyalgia -Continue Neurontin, and pain control  Essential hypertension -Continue Cozaar, metoprolol, Norvasc  Hyperlipidemia -Continue statin  Mild dementia -Stable, continue Aricept  Depression -Continue  Zoloft  Urge incontinence -Continue Vesicare  Insomnia -Continue Ambien  UTI -prior to admission, was placed on bactrim- nightly should be completed on 04/04/2017 -Had extensive discussion with patient and husband, it seems that patient has had several urinary tract infections and follows up with urology at [facility] (however here in [place]). Patient tests her urine at home and finds that she has multiple leukocytes, calls her primary care physician's office and is prescribed antibiotics. Discussed that this is incorrect, and the patient should have urine cultures done prior to starting antibiotics given that she has a history of ESBL. -Continue bactrim, monitor renal function  DVT Prophylaxis  Heparin --> Eliquis  Code Status: DNR  Family Communication: husband at bedside  Disposition Plan: Admitted. Pending SNF  Consultants Orthopedic surgery, Dr. Sharol Given  Procedures  Hemiarthroplasty femoral neck fracture  Antibiotics   Anti-infectives    Start     Dose/Rate Route Frequency Ordered Stop   04/02/17 1600  ceFAZolin (ANCEF) IVPB 2g/100 mL premix     2 g 200 mL/hr over 30 Minutes Intravenous Every 6 hours 04/02/17 1036 04/02/17 2327   04/02/17 0600  ceFAZolin (ANCEF) IVPB 2g/100 mL premix     2 g 200 mL/hr over 30 Minutes Intravenous On call to O.R. 04/02/17 0101 04/02/17 0800   04/01/17 1000  sulfamethoxazole-trimethoprim (BACTRIM DS,SEPTRA DS) 800-160 MG per tablet 1 tablet     1 tablet Oral 2 times daily 04/01/17 8341        Subjective:   Kristie [name] seen and examined today.  Patient states she's feeling much better today. Would like to go home. Denies chest pain, short of breath,  bone pain, nausea vomiting diarrhea constipation, dizziness or headache.   Objective:   Vitals:   04/02/17 1700 04/02/17 2100 04/03/17 0300 04/03/17 0932  BP: 125/61 (!) 118/55 119/64   Pulse: 87 83 92 (!) 105  Resp: 18 18 (!) 22   Temp: 98.6 F (37 C) 98.7 F (37.1 C) 97.8 F (36.6 C)    TempSrc: Oral Oral Oral   SpO2: 98% 95% 98% 93%  Weight:      Height:        Intake/Output Summary (Last 24 hours) at 04/03/17 1445 Last data filed at 04/03/17 0933  Gross per 24 hour  Intake           434.17 ml  Output             1500 ml  Net         -1065.83 ml   Filed Weights   04/01/17 0525  Weight: 84.8 kg (187 lb)   Exam  General: Well developed, well nourished, NAD, appears stated age  HEENT: NCAT, mucous membranes moist.   Cardiovascular: S1 S2 auscultated,RRR, no murmurs  Respiratory: Clear to auscultation bilaterally with equal chest rise  Abdomen: Soft, nontender, nondistended, + bowel sounds  Extremities: warm dry without cyanosis clubbing or edema  Neuro: AAOx3, nonfocal  Psych: Normal affect and demeanor, pleasant  Data Reviewed: I have personally reviewed following labs and imaging studies  CBC:  Recent Labs Lab 04/01/17 0530 04/01/17 0536 04/02/17 0406 04/03/17 0403  WBC 9.7  --  8.4 10.0  NEUTROABS 6.8  --   --   --   HGB 15.0 15.3* 12.8 12.0  HCT 45.7 45.0 39.8 37.8  MCV 94.6  --  94.3 94.7  PLT 205  --  177 956   Basic Metabolic Panel:  Recent Labs Lab 04/01/17 0530 04/01/17 0536 04/02/17 0406 04/03/17 0403  NA 139 141 135 138  K 4.3 4.3 4.1 4.8  CL 105 103 104 104  CO2 27  --  26 27  GLUCOSE 116* 110* 119* 117*  BUN 16 19 15 14   CREATININE 0.84 0.80 0.68 0.63  CALCIUM 9.4  --  8.7* 9.0   GFR: Estimated Creatinine Clearance: 57.6 mL/min (by C-G formula based on SCr of 0.63 mg/dL). Liver Function Tests: No results for input(s): AST, ALT, ALKPHOS, BILITOT, PROT, ALBUMIN in the last 168 hours. No results for input(s): LIPASE, AMYLASE in the last 168 hours. No results for input(s): AMMONIA in the last 168 hours. Coagulation Profile:  Recent Labs Lab 04/01/17 0530  INR 1.00   Cardiac Enzymes: No results for input(s): CKTOTAL, CKMB, CKMBINDEX, TROPONINI in the last 168 hours. BNP (last 3 results) No results for  input(s): PROBNP in the last 8760 hours. HbA1C: No results for input(s): HGBA1C in the last 72 hours. CBG: No results for input(s): GLUCAP in the last 168 hours. Lipid Profile: No results for input(s): CHOL, HDL, LDLCALC, TRIG, CHOLHDL, LDLDIRECT in the last 72 hours. Thyroid Function Tests: No results for input(s): TSH, T4TOTAL, FREET4, T3FREE, THYROIDAB in the last 72 hours. Anemia Panel: No results for input(s): VITAMINB12, FOLATE, FERRITIN, TIBC, IRON, RETICCTPCT in the last 72 hours. Urine analysis:    Component Value Date/Time   COLORURINE YELLOW 04/01/2017 1857   APPEARANCEUR CLEAR 04/01/2017 1857   LABSPEC 1.010 04/01/2017 1857   PHURINE 7.0 04/01/2017 1857   GLUCOSEU NEGATIVE 04/01/2017 1857   HGBUR NEGATIVE 04/01/2017 1857   BILIRUBINUR NEGATIVE 04/01/2017 [place] NEGATIVE 04/01/2017 1857  PROTEINUR NEGATIVE 04/01/2017 1857   UROBILINOGEN 0.2 04/15/2012 0720   NITRITE NEGATIVE 04/01/2017 1857   LEUKOCYTESUR MODERATE (A) 04/01/2017 1857   Sepsis Labs: @LABRCNTIP (procalcitonin:4,lacticidven:4)  ) Recent Results (from the past 240 hour(s))  Culture, blood (Routine X 2) w Reflex to ID Panel     Status: None (Preliminary result)   Collection Time: 04/01/17  8:30 AM  Result Value Ref Range Status   Specimen Description BLOOD BLOOD RIGHT HAND  Final   Special Requests   Final    BOTTLES DRAWN AEROBIC AND ANAEROBIC Blood Culture adequate volume   Culture NO GROWTH 2 DAYS  Final   Report Status PENDING  Incomplete  Culture, blood (Routine X 2) w Reflex to ID Panel     Status: None (Preliminary result)   Collection Time: 04/01/17  8:30 AM  Result Value Ref Range Status   Specimen Description BLOOD RIGHT ANTECUBITAL  Final   Special Requests IN PEDIATRIC BOTTLE Blood Culture adequate volume  Final   Culture NO GROWTH 2 DAYS  Final   Report Status PENDING  Incomplete  Culture, Urine     Status: Abnormal   Collection Time: 04/01/17  6:57 PM  Result Value Ref  Range Status   Specimen Description URINE, CLEAN CATCH  Final   Special Requests NONE  Final   Culture MULTIPLE SPECIES PRESENT, SUGGEST RECOLLECTION (A)  Final   Report Status 04/03/2017 FINAL  Final  Surgical pcr screen     Status: None   Collection Time: 04/01/17  9:44 PM  Result Value Ref Range Status   MRSA, PCR NEGATIVE NEGATIVE Final   Staphylococcus aureus NEGATIVE NEGATIVE Final    Comment: (NOTE) The Xpert SA Assay (FDA approved for NASAL specimens in patients 18 years of age and older), is one component of a comprehensive surveillance program. It is not intended to diagnose infection nor to guide or monitor treatment.       Radiology Studies: No results found.   Scheduled Meds: . acetaminophen  1,000 mg Oral Once  . acetaminophen  1,000 mg Oral QHS  . amLODipine  10 mg Oral Daily  . apixaban  5 mg Oral BID  . aspirin EC  325 mg Oral Q breakfast  . cholecalciferol  2,000 Units Oral Daily  . donepezil  10 mg Oral BID  . fluticasone  2 spray Each Nare Daily  . gabapentin  800 mg Oral TID  . losartan  50 mg Oral Daily  . metoprolol tartrate  25 mg Oral BID  . mirabegron ER  25 mg Oral Daily  . sertraline  50 mg Oral Daily  . simvastatin  10 mg Oral Q48H  . sulfamethoxazole-trimethoprim  1 tablet Oral BID   Continuous Infusions: . sodium chloride Stopped (04/01/17 0739)  . sodium chloride 10 mL/hr at 04/02/17 1055     LOS: 2 days   Time Spent in minutes   30 minutes  Flordia Kassem D.O. on 04/03/2017 at 2:45 PM  Between 7am to 7pm - Pager - [phone]  After 7pm go to [email or web address] - password [email or web address]  And look for the night coverage person covering for me after hours  [facility] Office  [phone]

## 2017-04-03 NOTE — Evaluation (Signed)
Physical Therapy Evaluation Patient Details Name: Kristie [name] MRN: [id number] DOB: [birth year] Today's Date: 04/03/2017   History of Present Illness  83 y.o. female with medical history significant for HTN, HLD, chronic back pain, fibromyalgia, insomnia, depression, spinal stenosis of the lumbar area, chronic generalized weakness, mild memory loss, recent UTI, on Bactrim, as well as recent history of PE from DVT. Pt fell and is now s/p ARTHROPLASTY BIPOLAR HIP (HEMIARTHROPLASTY) (Right).  Clinical Impression  Pt did well today but limited by fatique.  She will need the daily skilled services of PT to progress and return to her prior functional level.      Follow Up Recommendations SNF;Supervision/Assistance - 24 hour    Equipment Recommendations  None recommended by PT    Recommendations for Other Services       Precautions / Restrictions Precautions Precautions: Fall;Posterior Hip Precaution Booklet Issued: No Precaution Comments: pt was educated on hip precautions during session. Required Braces or Orthoses:  (pt wears right AFO when walking for prior foot drop) Restrictions Weight Bearing Restrictions: Yes RLE Weight Bearing: Weight bearing as tolerated      Mobility  Bed Mobility Overal bed mobility: Needs Assistance Bed Mobility: Supine to Sit     Supine to sit: Min assist;HOB elevated     General bed mobility comments: assist with RLE.  Transfers Overall transfer level: Needs assistance Equipment used: Rolling walker (2 wheeled) Transfers: Sit to/from Stand Sit to Stand: +2 physical assistance;Mod assist         General transfer comment: pt needed cues for hand placement and to look up while getting up from elevated surface  Ambulation/Gait Ambulation/Gait assistance: +2 physical assistance;Min assist Ambulation Distance (Feet): 8 Feet Assistive device: Rolling walker (2 wheeled) (left AFO) Gait Pattern/deviations: Decreased dorsiflexion - right;Decreased  step length - right;Narrow base of support;Decreased weight shift to right;Decreased stance time - right     General Gait Details: pts right leg tends to ER and adduct when walking.  pt needed max verbal cues for sequencing and to remember to abd right foot for wider stance when walking.  pt tired at 8 feet and looked like knees wobbly.  cahir pulled up behind her to sit with cues  Financial trader [place] (Stroke Patients Only)       Balance               Standing balance comment: pt reliant on bilateral UEs on RW                             Pertinent Vitals/Pain Pain Assessment: 0-10 Pain Score: 1  (pt had no pain before or after therapy - a little pain with movement) Faces Pain Scale: Hurts little more Pain Location: Rt hip Pain Descriptors / Indicators: Sharp Pain Intervention(s): Monitored during session;Repositioned    Home Living Family/patient expects to be discharged to:: Skilled nursing facility Living Arrangements: Spouse/significant other Available Help at Discharge: Family;Available 24 hours/day Type of Home: Independent living facility Home Access: Level entry     Home Layout: One level Home Equipment: Cane - single point;Walker - 4 wheels;Wheelchair - manual;Grab bars - tub/shower;Grab bars - toilet;Shower seat - built Academic librarian Comments: Pt has worked with PT at [facility] before and is familiar with them and like them    Prior Function Level of Independence: Independent with assistive  device(s)         Comments: walks with R AFO and rollator. she said legs are weak and more falls and near falls recently     Hand Dominance        Extremity/Trunk Assessment   Upper Extremity Assessment Upper Extremity Assessment: Defer to OT evaluation    Lower Extremity Assessment Lower Extremity Assessment: RLE deficits/detail RLE Deficits / Details: pt with chronic R foot drop - right  ankle 0/5, wears AFO.  overall legs grossly 3/5    Cervical / Trunk Assessment Cervical / Trunk Assessment: Normal  Communication   Communication: No difficulties  Cognition Arousal/Alertness: Awake/alert Behavior During Therapy: WFL for tasks assessed/performed Overall Cognitive Status: Within Functional Limits for tasks assessed                                 General Comments: husband present and supportive      General Comments General comments (skin integrity, edema, etc.): pts husband present for session    Exercises     Assessment/Plan    PT Assessment Patient needs continued PT services  PT Problem List Decreased strength;Decreased activity tolerance;Decreased mobility;Decreased knowledge of use of DME;Decreased safety awareness;Decreased knowledge of precautions;Pain       PT Treatment Interventions DME instruction;Gait training;Functional mobility training;Therapeutic activities;Therapeutic exercise;Patient/family education    PT Goals (Current goals can be found in the Care Plan section)  Acute Rehab PT Goals Patient Stated Goal: wants to get back to her apartment when she can walk better PT Goal Formulation: With patient/family Time For Goal Achievement: 2017-04-19 Potential to Achieve Goals: Good    Frequency 7X/week   Barriers to discharge   pt has supportive husband but needs more help than he can provide at this time - needs daily skilled assist    Co-evaluation PT/OT/SLP Co-Evaluation/Treatment: Yes Reason for Co-Treatment: For patient/therapist safety PT goals addressed during session: Mobility/safety with mobility;Proper use of DME OT goals addressed during session: ADL's and self-care;Other (comment) (mobility)       AM-PAC PT "6 Clicks" Daily Activity  Outcome Measure Difficulty turning over in bed (including adjusting bedclothes, sheets and blankets)?: A Little Difficulty moving from lying on back to sitting on the side of the  bed? : A Little Difficulty sitting down on and standing up from a chair with arms (e.g., wheelchair, bedside commode, etc,.)?: A Lot Help needed moving to and from a bed to chair (including a wheelchair)?: A Lot Help needed walking in hospital room?: A Lot Help needed climbing 3-5 steps with a railing? : Total 6 Click Score: 13    End of Session Equipment Utilized During Treatment: Gait belt Activity Tolerance: Patient limited by fatigue Patient left: in chair;with call bell/phone within reach;with family/visitor present Nurse Communication: Mobility status;Precautions;Weight bearing status PT Visit Diagnosis: History of falling (Z91.81);Pain;Other abnormalities of gait and mobility (R26.89) Pain - Right/Left: Right Pain - part of body: Hip    Time: 3762-8315 PT Time Calculation (min) (ACUTE ONLY): 25 min   Charges:   PT Evaluation $PT Eval Moderate Complexity: 1 Mod     PT G Codes:       04-12-17   Rande Lawman, PT   Loyal Buba 2017-04-12, 12:29 PM

## 2017-04-03 NOTE — NC FL2 (Signed)
[place] LEVEL OF CARE SCREENING TOOL     IDENTIFICATION  Patient Name: Kristie [name] Birthdate: Aug 11, 1928 Sex: female Admission Date (Current Location): 04/01/2017  [facility] and [place] Number:  Herbalist and Address:  [facility] [place], [place] [address], [place], [place] [address]      Provider Number: [id number]  Attending Physician Name and Address:  Cristal Ford, DO  Relative Name and Phone Number:       Current Level of Care: Hospital Recommended Level of Care: [place] Prior Approval Number:    Date Approved/Denied:   PASRR Number:   [id number]  Discharge Plan: SNF    Current Diagnoses: Patient Active Problem List   Diagnosis Date Noted  . Closed displaced fracture of right femoral neck ([place]) 04/01/2017  . Pain   . Right leg DVT ([place]) 03/16/2017  . Advanced care planning/counseling discussion 03/15/2017  . Left breast mass 03/11/2017  . Kidney lesion, native, left 03/11/2017  . Pulmonary embolus ([place]) 03/11/2017  . CAP (community acquired pneumonia) 03/09/2017  . Primary osteoarthritis of both knees 10/19/2016  . DDD (degenerative disc disease), lumbar 10/19/2016  . Osteopenia of multiple sites 10/19/2016  . Bilateral leg weakness 09/15/2016  . Vitamin D deficiency 09/15/2016  . Cognitive impairment 09/13/2016  . Depression 08/05/2016  . Memory deficit 08/05/2016  . Recurrent UTI 06/24/2016  . Spinal stenosis of lumbar region 04/25/2014  . Fibromyalgia syndrome 04/25/2014  . Urine incontinence 04/25/2014  . Senile osteoporosis 04/25/2014  . Insomnia 04/25/2014  . Dysphonia 10/21/2013  . Right foot drop 04/27/2013  . Abnormality of gait 04/27/2013  . Generalized weakness   . HLD (hyperlipidemia) 04/14/2012  . Pubic bone fracture ([place]) 04/14/2012  . Hypokalemia 04/14/2012  . Urge incontinence 09/15/2011  . Essential hypertension 08/18/2007  . Primary osteoarthritis of both hands 08/18/2007   . OTHER ACQUIRED DEFORMITY OF ANKLE AND FOOT OTHER 08/18/2007  . UNEQUAL LEG LENGTH 08/18/2007  . COLONIC POLYPS, HX OF 08/18/2007    Orientation RESPIRATION BLADDER Height & Weight     Self, Time, Situation, Place  Normal Incontinent, External catheter Weight: 187 lb (84.8 kg) Height:  5\' 8"  (172.7 cm)  BEHAVIORAL SYMPTOMS/MOOD NEUROLOGICAL BOWEL NUTRITION STATUS      Continent Diet (See DC summary)  AMBULATORY STATUS COMMUNICATION OF NEEDS Skin   Extensive Assist Verbally Surgical wounds, Skin abrasions, Bruising                       Personal Care Assistance Level of Assistance  Bathing, Feeding, Dressing Bathing Assistance: Limited assistance Feeding assistance: Independent Dressing Assistance: Limited assistance     Functional Limitations Info  Sight, Hearing, Speech Sight Info: Adequate Hearing Info: Adequate Speech Info: Adequate    SPECIAL CARE FACTORS FREQUENCY  PT (By licensed PT), OT (By licensed OT)     PT Frequency: 5x a week OT Frequency: 5x a week            Contractures Contractures Info: Not present    Additional Factors Info  Code Status, Allergies, Isolation Precautions Code Status Info: DNR Allergies Info: Restoril Temazepam, Bactrim Sulfamethoxazole-trimethoprim     Isolation Precautions Info: ESBL     Current Medications (04/03/2017):  This is the current hospital active medication list Current Facility-Administered Medications  Medication Dose Route Frequency Provider Last Rate Last Dose  . 0.9 %  sodium chloride infusion   Intravenous Continuous Rondel Jumbo, PA-C   Stopped  at 04/01/17 0739  . 0.9 %  sodium chloride infusion   Intravenous Continuous Newt Minion, MD 10 mL/hr at 04/02/17 1055    . acetaminophen (TYLENOL) tablet 650 mg  650 mg Oral Q6H PRN Newt Minion, MD       Or  . acetaminophen (TYLENOL) suppository 650 mg  650 mg Rectal Q6H PRN Newt Minion, MD      . acetaminophen (TYLENOL) tablet 1,000 mg  1,000 mg  Oral Once Tyrone Nine, Dan, DO      . acetaminophen (TYLENOL) tablet 1,000 mg  1,000 mg Oral QHS Rondel Jumbo, PA-C   1,000 mg at 04/02/17 2133  . amLODipine (NORVASC) tablet 10 mg  10 mg Oral Daily Rondel Jumbo, PA-C   10 mg at 04/02/17 1707  . apixaban (ELIQUIS) tablet 5 mg  5 mg Oral BID Newt Minion, MD   5 mg at 04/03/17 1003  . aspirin EC tablet 325 mg  325 mg Oral Q breakfast Newt Minion, MD   325 mg at 04/03/17 1003  . bisacodyl (DULCOLAX) EC tablet 5 mg  5 mg Oral Daily PRN Rondel Jumbo, PA-C      . cholecalciferol (VITAMIN D) tablet 2,000 Units  2,000 Units Oral Daily Rondel Jumbo, PA-C   2,000 Units at 04/03/17 1003  . donepezil (ARICEPT) tablet 10 mg  10 mg Oral BID Rondel Jumbo, PA-C   10 mg at 04/03/17 1003  . fluticasone (FLONASE) 50 MCG/ACT nasal spray 2 spray  2 spray Each Nare Daily Rondel Jumbo, PA-C   2 spray at 04/03/17 1003  . gabapentin (NEURONTIN) capsule 800 mg  800 mg Oral TID Rondel Jumbo, PA-C   800 mg at 04/03/17 1001  . hydrALAZINE (APRESOLINE) injection 5 mg  5 mg Intravenous Q8H PRN Rondel Jumbo, PA-C      . HYDROcodone-acetaminophen (NORCO/VICODIN) 5-325 MG per tablet 1-2 tablet  1-2 tablet Oral Q6H PRN Rondel Jumbo, PA-C   2 tablet at 04/01/17 1855  . HYDROcodone-acetaminophen (NORCO/VICODIN) 5-325 MG per tablet 1-2 tablet  1-2 tablet Oral Q6H PRN Newt Minion, MD   2 tablet at 04/02/17 1316  . losartan (COZAAR) tablet 50 mg  50 mg Oral Daily Sharene Butters E, PA-C   50 mg at 04/03/17 1003  . menthol-cetylpyridinium (CEPACOL) lozenge 3 mg  1 lozenge Oral PRN Newt Minion, MD       Or  . phenol (CHLORASEPTIC) mouth spray 1 spray  1 spray Mouth/Throat PRN Newt Minion, MD      . methocarbamol (ROBAXIN) tablet 500 mg  500 mg Oral Q8H PRN Ivor Costa, MD   500 mg at 04/02/17 1317  . metoCLOPramide (REGLAN) tablet 5-10 mg  5-10 mg Oral Q8H PRN Newt Minion, MD       Or  . metoCLOPramide (REGLAN) injection 5-10 mg  5-10 mg Intravenous  Q8H PRN Newt Minion, MD      . metoprolol tartrate (LOPRESSOR) tablet 25 mg  25 mg Oral BID Rondel Jumbo, PA-C   25 mg at 04/03/17 1003  . mirabegron ER (MYRBETRIQ) tablet 25 mg  25 mg Oral Daily Rondel Jumbo, PA-C   25 mg at 04/02/17 2134  . morphine 4 MG/ML injection 0.52 mg  0.52 mg Intravenous Q2H PRN Newt Minion, MD      . morphine 4 MG/ML injection 1 mg  1 mg Intravenous Q2H PRN Ivor Costa,  MD      . ondansetron (ZOFRAN) tablet 4 mg  4 mg Oral Q6H PRN Newt Minion, MD       Or  . ondansetron [facility]) injection 4 mg  4 mg Intravenous Q6H PRN Newt Minion, MD      . oxyCODONE-acetaminophen (PERCOCET/ROXICET) 5-325 MG per tablet 1 tablet  1 tablet Oral Q4H PRN Ivor Costa, MD   1 tablet at 04/02/17 2257  . senna-docusate (Senokot-S) tablet 1 tablet  1 tablet Oral QHS PRN Rondel Jumbo, PA-C      . sertraline (ZOLOFT) tablet 50 mg  50 mg Oral Daily Sharene Butters E, PA-C   50 mg at 04/03/17 1002  . simvastatin (ZOCOR) tablet 10 mg  10 mg Oral Q48H Rondel Jumbo, PA-C   10 mg at 04/02/17 1708  . sodium phosphate (FLEET) 7-19 GM/118ML enema 1 enema  1 enema Rectal Once PRN Rondel Jumbo, PA-C      . sulfamethoxazole-trimethoprim (BACTRIM DS,SEPTRA DS) 800-160 MG per tablet 1 tablet  1 tablet Oral BID Rondel Jumbo, PA-C   1 tablet at 04/03/17 1003  . zolpidem (AMBIEN) tablet 5 mg  5 mg Oral QHS PRN Rondel Jumbo, PA-C         Discharge Medications: Please see discharge summary for a list of discharge medications.  Relevant Imaging Results:  Relevant Lab Results:   Additional Information SSN:  [id number]  [place], [place]

## 2017-04-03 NOTE — Discharge Instructions (Signed)
INSTRUCTIONS AFTER JOINT REPLACEMENT  ° °o Remove items at home which could result in a fall. This includes throw rugs or furniture in walking pathways °o ICE to the affected joint every three hours while awake for 30 minutes at a time, for at least the first 3-5 days, and then as needed for pain and swelling.  Continue to use ice for pain and swelling. You may notice swelling that will progress down to the foot and ankle.  This is normal after surgery.  Elevate your leg when you are not up walking on it.   °o Continue to use the breathing machine you got in the hospital (incentive spirometer) which will help keep your temperature down.  It is common for your temperature to cycle up and down following surgery, especially at night when you are not up moving around and exerting yourself.  The breathing machine keeps your lungs expanded and your temperature down. ° ° °DIET:  As you were doing prior to hospitalization, we recommend a well-balanced diet. ° °DRESSING / WOUND CARE / SHOWERING ° °You may change your dressing 3-5 days after surgery.  Then change the dressing every day with sterile gauze.  Please use good hand washing techniques before changing the dressing.  Do not use any lotions or creams on the incision until instructed by your surgeon. ° °ACTIVITY ° °o Increase activity slowly as tolerated, but follow the weight bearing instructions below.   °o No driving for 6 weeks or until further direction given by your physician.  You cannot drive while taking narcotics.  °o No lifting or carrying greater than 10 lbs. until further directed by your surgeon. °o Avoid periods of inactivity such as sitting longer than an hour when not asleep. This helps prevent blood clots.  °o You may return to work once you are authorized by your doctor.  ° ° ° °WEIGHT BEARING  ° °Weight bearing as tolerated with assist device (walker, cane, etc) as directed, use it as long as suggested by your surgeon or therapist, typically at  least 4-6 weeks. ° ° °EXERCISES ° °Results after joint replacement surgery are often greatly improved when you follow the exercise, range of motion and muscle strengthening exercises prescribed by your doctor. Safety measures are also important to protect the joint from further injury. Any time any of these exercises cause you to have increased pain or swelling, decrease what you are doing until you are comfortable again and then slowly increase them. If you have problems or questions, call your caregiver or physical therapist for advice.  ° °Rehabilitation is important following a joint replacement. After just a few days of immobilization, the muscles of the leg can become weakened and shrink (atrophy).  These exercises are designed to build up the tone and strength of the thigh and leg muscles and to improve motion. Often times heat used for twenty to thirty minutes before working out will loosen up your tissues and help with improving the range of motion but do not use heat for the first two weeks following surgery (sometimes heat can increase post-operative swelling).  ° °These exercises can be done on a training (exercise) mat, on the floor, on a table or on a bed. Use whatever works the best and is most comfortable for you.    Use music or television while you are exercising so that the exercises are a pleasant break in your day. This will make your life better with the exercises acting as a break   in your routine that you can look forward to.   Perform all exercises about fifteen times, three times per day or as directed.  You should exercise both the operative leg and the other leg as well. ° °Exercises include: °  °• Quad Sets - Tighten up the muscle on the front of the thigh (Quad) and hold for 5-10 seconds.   °• Straight Leg Raises - With your knee straight (if you were given a brace, keep it on), lift the leg to 60 degrees, hold for 3 seconds, and slowly lower the leg.  Perform this exercise against  resistance later as your leg gets stronger.  °• Leg Slides: Lying on your back, slowly slide your foot toward your buttocks, bending your knee up off the floor (only go as far as is comfortable). Then slowly slide your foot back down until your leg is flat on the floor again.  °• Angel Wings: Lying on your back spread your legs to the side as far apart as you can without causing discomfort.  °• Hamstring Strength:  Lying on your back, push your heel against the floor with your leg straight by tightening up the muscles of your buttocks.  Repeat, but this time bend your knee to a comfortable angle, and push your heel against the floor.  You may put a pillow under the heel to make it more comfortable if necessary.  ° °A rehabilitation program following joint replacement surgery can speed recovery and prevent re-injury in the future due to weakened muscles. Contact your doctor or a physical therapist for more information on knee rehabilitation.  ° ° °CONSTIPATION ° °Constipation is defined medically as fewer than three stools per week and severe constipation as less than one stool per week.  Even if you have a regular bowel pattern at home, your normal regimen is likely to be disrupted due to multiple reasons following surgery.  Combination of anesthesia, postoperative narcotics, change in appetite and fluid intake all can affect your bowels.  ° °YOU MUST use at least one of the following options; they are listed in order of increasing strength to get the job done.  They are all available over the counter, and you may need to use some, POSSIBLY even all of these options:   ° °Drink plenty of fluids (prune juice may be helpful) and high fiber foods °Colace 100 mg by mouth twice a day  °Senokot for constipation as directed and as needed Dulcolax (bisacodyl), take with full glass of water  °Miralax (polyethylene glycol) once or twice a day as needed. ° °If you have tried all these things and are unable to have a bowel  movement in the first 3-4 days after surgery call either your surgeon or your primary doctor.   ° °If you experience loose stools or diarrhea, hold the medications until you stool forms back up.  If your symptoms do not get better within 1 week or if they get worse, check with your doctor.  If you experience "the worst abdominal pain ever" or develop nausea or vomiting, please contact the office immediately for further recommendations for treatment. ° ° °ITCHING:  If you experience itching with your medications, try taking only a single pain pill, or even half a pain pill at a time.  You can also use Benadryl over the counter for itching or also to help with sleep.  ° °TED HOSE STOCKINGS:  Use stockings on both legs until for at least 2 weeks or as   directed by physician office. They may be removed at night for sleeping.  MEDICATIONS:  See your medication summary on the After Visit Summary that nursing will review with you.  You may have some home medications which will be placed on hold until you complete the course of blood thinner medication.  It is important for you to complete the blood thinner medication as prescribed.  PRECAUTIONS:  If you experience chest pain or shortness of breath - call 911 immediately for transfer to the hospital emergency department.   If you develop a fever greater that 101 F, purulent drainage from wound, increased redness or drainage from wound, foul odor from the wound/dressing, or calf pain - CONTACT YOUR SURGEON.                                                   FOLLOW-UP APPOINTMENTS:  If you do not already have a post-op appointment, please call the office for an appointment to be seen by your surgeon.  Guidelines for how soon to be seen are listed in your After Visit Summary, but are typically between 1-4 weeks after surgery.  OTHER INSTRUCTIONS:   Knee Replacement:  Do not place pillow under knee, focus on keeping the knee straight while resting. CPM  instructions: 0-90 degrees, 2 hours in the morning, 2 hours in the afternoon, and 2 hours in the evening. Place foam block, curve side up under heel at all times except when in CPM or when walking.  DO NOT modify, tear, cut, or change the foam block in any way.  MAKE SURE YOU:   Understand these instructions.   Get help right away if you are not doing well or get worse.    Thank you for letting us be a part of your medical care team.  It is a privilege we respect greatly.  We hope these instructions will help you stay on track for a fast and full recovery!     Information on my medicine - ELIQUIS (apixaban)  This medication education was reviewed with me or my healthcare representative as part of my discharge preparation.  The pharmacist that spoke with me during my hospital stay was:  Saundra Shelling, [facility]  Why was Eliquis prescribed for you? Eliquis was prescribed to treat blood clots that may have been found in the veins of your legs (deep vein thrombosis) or in your lungs (pulmonary embolism) and to reduce the risk of them occurring again.  What do You need to know about Eliquis ? The dose is ONE 5 mg tablet taken TWICE daily.  Eliquis may be taken with or without food.   Try to take the dose about the same time in the morning and in the evening. If you have difficulty swallowing the tablet whole please discuss with your pharmacist how to take the medication safely.  Take Eliquis exactly as prescribed and DO NOT stop taking Eliquis without talking to the doctor who prescribed the medication.  Stopping may increase your risk of developing a new blood clot.  Refill your prescription before you run out.  After discharge, you should have regular check-up appointments with your healthcare provider that is prescribing your Eliquis.    What do you do if you miss a dose? If a dose of ELIQUIS is not taken at the scheduled time, take it  as soon as possible on the same day and  twice-daily administration should be resumed. The dose should not be doubled to make up for a missed dose.  Important Safety Information A possible side effect of Eliquis is bleeding. You should call your healthcare provider right away if you experience any of the following: ? Bleeding from an injury or your nose that does not stop. ? Unusual colored urine (red or dark brown) or unusual colored stools (red or black). ? Unusual bruising for unknown reasons. ? A serious fall or if you hit your head (even if there is no bleeding).  Some medicines may interact with Eliquis and might increase your risk of bleeding or clotting while on Eliquis. To help avoid this, consult your healthcare provider or pharmacist prior to using any new prescription or non-prescription medications, including herbals, vitamins, non-steroidal anti-inflammatory drugs (NSAIDs) and supplements.  This website has more information on Eliquis (apixaban): http://[email or web address].com/eliquis/home

## 2017-04-03 NOTE — Clinical Social Work Placement (Signed)
   CLINICAL SOCIAL WORK PLACEMENT  NOTE  Date:  04/03/2017  Patient Details  Name: Kristie [name] MRN: [id number] Date of Birth: 01/01/1929  Clinical Social Work is seeking post-discharge placement for this patient at the [place] level of care (*CSW will initial, date and re-position this form in  chart as items are completed):  Yes   Patient/family provided with [facility] Work Department's list of facilities offering this level of care within the geographic area requested by the patient (or if unable, by the patient's family).  Yes   Patient/family informed of their freedom to choose among providers that offer the needed level of care, that participate in Medicare, Medicaid or managed care program needed by the patient, have an available bed and are willing to accept the patient.  Yes   Patient/family informed of [facility]'s ownership interest in [facility] and [facility], as well as of the fact that they are under no obligation to receive care at these facilities.  PASRR submitted to EDS on 04/03/17     PASRR number received on 04/03/17     Existing PASRR number confirmed on       FL2 transmitted to all facilities in geographic area requested by pt/family on 04/03/17     FL2 transmitted to all facilities within larger geographic area on       Patient informed that his/her managed care company has contracts with or will negotiate with certain facilities, including the following:            Patient/family informed of bed offers received.  Patient chooses bed at       Physician recommends and patient chooses bed at      Patient to be transferred to   on  .  Patient to be transferred to facility by       Patient family notified on   of transfer.  Name of family member notified:        PHYSICIAN Please sign FL2, Please sign DNR     Additional Comment:    _______________________________________________ Lilly Cove,  LCSW 04/03/2017, 12:11 PM

## 2017-04-03 NOTE — Clinical Social Work Note (Signed)
Clinical Social Work Assessment  Patient Details  Name: Kristie [name] MRN: [id number] Date of Birth: [birth year]  Date of referral:  04/03/17               Reason for consult:  Facility Placement, Discharge Planning                Permission sought to share information with:  Case Manager, Customer service manager, Family Supports Permission granted to share information::  Yes, Verbal Permission Granted  Name::        Agency::  [facility]  Relationship::  Husband at bedside during assessment  Contact Information:     Housing/Transportation Living arrangements for the past 2 months:  [place] of Information:  Patient, Medical Team, Spouse, Facility Patient Interpreter Needed:  None Criminal Activity/Legal Involvement Pertinent to Current Situation/Hospitalization:  No - Comment as needed Significant Relationships:  Other Family Members, Spouse, Warehouse manager, Friend Lives with:  Spouse Do you feel safe going back to the place where you live?  No Need for family participation in patient care:  No (Coment)  Care giving concerns:  Patient admitted post fall at [facility]. Reports she was in the bathroom and fell over her walker. Reports she also has a UTI and has had progressive weakness.  Reports she is in agreement to SNF recommendation for continued rehab.  Patient lives with husband at [facility].  Will complete rehab and then return to ILF   Facilities manager / plan:  Assessment and consult completed for SNF work up. Patient agreeable to SNF at dc at [facility].  SNF work up completed. Will follow up with facility for admission on Monday.   Employment status:  Retired Nurse, adult PT Recommendations:  [place] / Referral to community resources:  [place]  Patient/Family's Response to care:  Agreeable to plan  Patient/Family's  Understanding of and Emotional Response to Diagnosis, Current Treatment, and Prognosis:  Patient verbalizes she has noticed she has gotten weaker which correlates with fall in bathroom. Patient understanding of recommendations and hopeful for dc today or likely Monday.  Emotional Assessment Appearance:  Appears stated age Attitude/Demeanor/Rapport:    Affect (typically observed):  Accepting, Adaptable, Pleasant Orientation:  Oriented to Self, Oriented to Place, Oriented to  Time, Oriented to Situation Alcohol / Substance use:  Not Applicable Psych involvement (Current and /or in the community):  No (Comment)  Discharge Needs  Concerns to be addressed:  Care Coordination Readmission within the last 30 days:  Yes Current discharge risk:  None Barriers to Discharge:  Continued Medical Work up   Lilly Cove, LCSW 04/03/2017, 11:55 AM

## 2017-04-04 ENCOUNTER — Encounter (HOSPITAL_COMMUNITY): Payer: Self-pay | Admitting: Orthopedic Surgery

## 2017-04-04 DIAGNOSIS — I824Y1 Acute embolism and thrombosis of unspecified deep veins of right proximal lower extremity: Secondary | ICD-10-CM

## 2017-04-04 DIAGNOSIS — Z86010 Personal history of colonic polyps: Secondary | ICD-10-CM

## 2017-04-04 LAB — BASIC METABOLIC PANEL
ANION GAP: 4 — AB (ref 5–15)
BUN: 17 mg/dL (ref 6–20)
CALCIUM: 9.2 mg/dL (ref 8.9–10.3)
CO2: 28 mmol/L (ref 22–32)
CREATININE: 0.66 mg/dL (ref 0.44–1.00)
Chloride: 104 mmol/L (ref 101–111)
Glucose, Bld: 104 mg/dL — ABNORMAL HIGH (ref 65–99)
Potassium: 5.2 mmol/L — ABNORMAL HIGH (ref 3.5–5.1)
Sodium: 136 mmol/L (ref 135–145)

## 2017-04-04 LAB — POTASSIUM: Potassium: 4.6 mmol/L (ref 3.5–5.1)

## 2017-04-04 MED ORDER — SODIUM POLYSTYRENE SULFONATE 15 GM/60ML PO SUSP
15.0000 g | Freq: Once | ORAL | Status: AC
  Administered 2017-04-04: 15 g via ORAL
  Filled 2017-04-04: qty 60

## 2017-04-04 NOTE — Discharge Summary (Signed)
Physician Discharge Summary  Kristie [name] RCV:[id number] DOB: [birth year] DOA: 04/01/2017  PCP: Blanchie Serve, MD  Admit date: 04/01/2017 Discharge date: 04/04/2017  Time spent: 45 minutes  Recommendations for Outpatient Follow-up:  Patient will be discharged to [facility], continue physical and occupational therapy.  Patient will need to follow up with primary care provider within one week of discharge.  Follow up with Dr. Sharol Given in 2 weeks. Follow up with Dr. Amalia Hailey on 04/05/2017 at 2:30pm. Patient should continue medications as prescribed.  Patient should follow a heart healthy diet.   Discharge Diagnoses:  Right femoral neck fracture History of PE and DVT Chronic back pain/fibromyalgia Essential hypertension Hyperlipidemia Mild dementia Depression Urge incontinence Insomnia UTI  Discharge Condition: stable  Diet recommendation: heart healthy  Filed Weights   04/01/17 0525  Weight: 84.8 kg (187 lb)    History of present illness:  On 04/01/2017 by Dr. Lynnell Dike Dornis a 83 y.o.femalewith medical history significant for HTN, HLD, chronic back pain, fibromyalgia, insomnia, depression, spinal stenosis of the lumbar area, chronic generalized weakness, mild memory loss, recent UTI, on Bactrim, as well as recent history of PE from DVT on Eliquissince 03/11/2017 presenting to the EDafter falling on her right hiplast night while attempting to the bathroom. She complains ofsignificant pain in the right hip, unable to get up and move her legs. Other than pain medication, nothing relieves the symptoms or aggravates them. The patient denies any loss of consciousness, headache, or neck pain. She denies any vision changes. No confusion is repoted. She denies any shortness of breath or chest pain. She denies any abdominal pain.No dysuria or gross hematuria, but she does has chronic incontinence She has chronic back pain, but this is not worse from prior.  Hospital Course:    Right femoral neck fracture -Status post fall -X-ray showed acute fracture of the right femoral neck with varus angulation. Fractures of the right superior and inferior pubic rami. Possible fracture versus bone graft donor site in the right iliac wing. -Orthopedic consult and appreciated -Status post surgery -Continue pain control -PT and OT recommended SNF -Patient resides in independent living at [facility] -Follow up with Dr. Sharol Given in 2 weeks- placed on aspirin for DVT prophylaxis but patient is on Eliquis?  History of PE and DVT -In August 2018 -Continue Eliquis  Chronic back pain/fibromyalgia -Continue Neurontin, and pain control  Essential hypertension -Continue Cozaar, metoprolol, Norvasc  Hyperlipidemia -Continue statin  Mild dementia -Stable, continue Aricept  Depression -Continue Zoloft  Urge incontinence -Continue Vesicare -follow up with urology- Dr. Alona Bene  Insomnia -Continue Ambien  UTI -prior to admission, was placed on bactrim- nightly should be completed on 04/04/2017 -Had extensive discussion with patient and husband, it seems that patient has had several urinary tract infections and follows up with urology at [facility] (however here in [place]). Patient tests her urine at home and finds that she has multiple leukocytes, calls her primary care physician's office and is prescribed antibiotics. Discussed that this is incorrect, and the patient should have urine cultures done prior to starting antibiotics given that she has a history of ESBL. -Continue bactrim, monitor renal function  Consultants Orthopedic surgery, Dr. Sharol Given  Procedures  Hemiarthroplasty femoral neck fracture  Discharge Exam: Vitals:   04/04/17 0423 04/04/17 1016  BP: 110/60   Pulse: 75   Resp: 17   Temp: 98 F (36.7 C)   SpO2: 91% 95%   Patient feeling better today and ready to go  home. Denies chest pain, shortness of breath, abdominal pain, nausea, vomiting,  diarrhea, constipation.   General: Well developed, well nourished, NAD, appears stated age  83: NCAT, mucous membranes moist.  Cardiovascular: S1 S2 auscultated, RRR, no murmurs  Respiratory: Clear to auscultation bilaterally  Abdomen: Soft, nontender, nondistended, + bowel sounds  Extremities: warm dry without cyanosis clubbing or edema  Neuro: AAOx3, nonfocal   Psych:Appropriate mood and affect  Discharge Instructions Discharge Instructions    Weight bearing as tolerated    Complete by:  As directed    Laterality:  right   Extremity:  Lower     Current Discharge Medication List    START taking these medications   Details  aspirin EC 325 MG tablet Take 1 tablet (325 mg total) by mouth daily. Qty: 30 tablet, Refills: 0      CONTINUE these medications which have CHANGED   Details  acetaminophen (TYLENOL) 500 MG tablet Take 1 tablet (500 mg total) by mouth every 6 (six) hours as needed for mild pain. Qty: 30 tablet, Refills: 0      CONTINUE these medications which have NOT CHANGED   Details  amLODipine (NORVASC) 10 MG tablet Take 1 tablet (10 mg total) by mouth daily. Qty: 90 tablet, Refills: 2   Associated Diagnoses: Essential hypertension    apixaban (ELIQUIS) 5 MG TABS tablet Take 1 tablet (5 mg total) by mouth 2 (two) times daily. Qty: 60 tablet, Refills: 0    cholecalciferol (VITAMIN D) 1000 UNITS tablet Take 2,000 Units by mouth daily.     diclofenac sodium (VOLTAREN) 1 % GEL APPLY 4 GRAMS TO 3 LARGE JOINTS UP TO 3 TIMES A DAY AS NEEDED Qty: 600 g, Refills: 0    donepezil (ARICEPT) 10 MG tablet Take 10 mg by mouth 2 (two) times daily.    eszopiclone (LUNESTA) 1 MG TABS tablet Take 1 mg by mouth at bedtime. Take immediately before bedtime    fluticasone (FLONASE) 50 MCG/ACT nasal spray Place 2 sprays into both nostrils daily. 2 sprays in each nostril once daily Qty: 16 g, Refills: 2    gabapentin (NEURONTIN) 800 MG tablet TAKE 1 TABLET 3 TIMES A  DAY. Qty: 90 tablet, Refills: 1    Lactobacillus (PROBIOTIC ACIDOPHILUS PO) Take 1 capsule by mouth daily.    losartan (COZAAR) 50 MG tablet Take 1 tablet (50 mg total) by mouth daily. Qty: 90 tablet, Refills: 1    metoprolol tartrate (LOPRESSOR) 25 MG tablet TAKE 1 TABLET TWICE DAILY TO CONTROL BLOOD PRESSURE. Qty: 180 tablet, Refills: 2    mirabegron ER (MYRBETRIQ) 25 MG TB24 tablet Take 25 mg by mouth daily.    sertraline (ZOLOFT) 50 MG tablet TAKE 1 TABLET EACH DAY. Qty: 30 tablet, Refills: 0   Associated Diagnoses: Depression, unspecified depression type    simvastatin (ZOCOR) 10 MG tablet Take 1 tablet (10 mg total) by mouth every other day. Qty: 45 tablet, Refills: 1    solifenacin (VESICARE) 10 MG tablet Take 1 tablet (10 mg total) by mouth daily. Qty: 90 tablet, Refills: 0    sulfamethoxazole-trimethoprim (BACTRIM DS,SEPTRA DS) 800-160 MG tablet Take 1 tablet by mouth 2 (two) times daily.    zolpidem (AMBIEN) 10 MG tablet TAKE 1 TABLET AT BEDTIME AS NEEDED. Qty: 90 tablet, Refills: 0    cefpodoxime (VANTIN) 200 MG tablet Take 1 tablet (200 mg total) by mouth 2 (two) times daily. Qty: 14 tablet, Refills: 0    guaiFENesin (MUCINEX) 600 MG 12 hr  tablet Take 2 tablets (1,200 mg total) by mouth 2 (two) times daily. Qty: 30 tablet, Refills: 0      STOP taking these medications     aspirin 81 MG chewable tablet        Allergies  Allergen Reactions  . Restoril [Temazepam] Other (See Comments)    Nightmares  . Bactrim [Sulfamethoxazole-Trimethoprim] Nausea And Vomiting    Contact information for follow-up providers    Newt Minion, MD Follow up in 2 week(s).   Specialty:  Orthopedic Surgery Contact information: [place] [address] [phone]        Blanchie Serve, MD. Schedule an appointment as soon as possible for a visit in 1 week(s).   Specialty:  Internal Medicine Why:  Hospital follow up  Contact information: [place] [address] [phone]        Domingo Pulse, MD. Go on 04/05/2017.   Specialty:  Urology Why:  At 2:30pm Contact information: [place] [address] [phone]            Contact information for after-discharge care    Destination    [facility] .   Specialty:  [place] information: [place] [phone]                   The results of significant diagnostics from this hospitalization (including imaging, microbiology, ancillary and laboratory) are listed below for reference.    Significant Diagnostic Studies: Dg Chest 1 View  Result Date: 04/01/2017 CLINICAL DATA:  Fall.  Right hip fracture. EXAM: CHEST 1 VIEW COMPARISON:  CT chest and chest 03/10/2017 FINDINGS: Heart size and pulmonary vascularity are normal. Shallow inspiration with atelectasis in the right lung base. Nodular opacity in the left apex measuring 1.9 cm diameter. This was present on previous CT chest 03/10/2017 and may be decreasing in size. Follow-up CT or PET-CT scan 3 months after the previous CT remains indicated. No airspace disease or consolidation. No blunting of costophrenic angles. No pneumothorax. Calcified and tortuous aorta. IMPRESSION: Shallow inspiration with slight atelectasis in the right lung base. No evidence of active pulmonary disease. Left apical nodule measuring 19 mm may be decreasing since a previous CT scan. Follow-up recommended. Electronically Signed   By: Lucienne Capers M.D.   On: 04/01/2017 06:15   Dg Chest 2 View  Result Date: 03/10/2017 CLINICAL DATA:  Cough, short of breath EXAM: CHEST  2 VIEW COMPARISON:  03/09/2017 FINDINGS: Normal cardiac silhouette with ectatic aorta. The LEFT apical density is less pronounced but persistent and asymmetric with the RIGHT side. Lung bases are clear. No pulmonary edema. No osseous abnormality. IMPRESSION:  Persistent LEFT apical density. Recommend CT thorax to exclude pulmonary nodule versus apical scarring. Electronically Signed   By: Suzy Bouchard M.D.   On: 03/10/2017 08:11   Dg Chest 2 View  Result Date: 03/09/2017 CLINICAL DATA:  Fever, weakness for 2 days EXAM: CHEST  2 VIEW COMPARISON:  09/12/2016 FINDINGS: There is focal left apical opacity which may reflect a pulmonary nodule versus artifact resulting from the adjacent first ribs. There is no other focal parenchymal opacity. There is no pleural effusion or pneumothorax. The heart and mediastinal contours are unremarkable. The osseous structures are unremarkable. IMPRESSION: 1. There is focal left apical opacity which may reflect a pulmonary nodule versus artifact resulting from the adjacent first ribs. Recommend apical lordotic  PA chest x-ray for further evaluation. Electronically Signed   By: Kathreen Devoid   On: 03/09/2017 14:05   Ct Chest W Contrast  Result Date: 03/10/2017 CLINICAL DATA:  Persisting cough. Fever and weakness. Left apical density on radiograph. EXAM: CT CHEST WITH CONTRAST TECHNIQUE: Multidetector CT imaging of the chest was performed during intravenous contrast administration. CONTRAST:  75 cc Isovue 300 IV COMPARISON:  Chest radiograph earlier this day. FINDINGS: Cardiovascular: Filling defect within medial segmental pulmonary artery of the right middle lobe consistent with pulmonary embolus. Atherosclerosis of the thoracic aorta which is tortuous. No aneurysm. There are coronary artery calcifications. Heart is normal in size. No right heart strain. Mediastinum/Nodes: Enlarged lower paratracheal node measures 15 mm short axis. Small upper paratracheal nodes. 10 mm node in the AP window. Calcified left infrahilar lymph nodes, no noncalcified hilar adenopathy. No axillary adenopathy. No pericardial effusion. The esophagus is decompressed. Visualized thyroid gland is normal. Lungs/Pleura: Masslike consolidative opacity at the left  lung apex with surrounding ground-glass, corresponding to radiographic abnormality. The consolidative component measures approximately 3.2 cm. This abuts the pleura posterior superiorly. Some surrounding spiculation adjacent to the ground-glass opacity. Scattered linear atelectasis, no additional consolidation. Calcified nodules in the right and left lower lobes consistent prior granulomatous disease. No definite additional noncalcified nodules. Minimal pleural thickening in both lower lungs without pleural fluid. Upper Abdomen: Thickening of the left adrenal gland without discrete nodule. Right adrenal gland is normal. 17 mm intermediate lesion in the left upper kidney. Calcified granuloma in the spleen. There is a 17 mm low-density structure abutting the medial right lobe of the liver that may be an exophytic liver lesion, incompletely characterized. Musculoskeletal: 2 cm nodule in the lateral left breast. Moderate compression fracture inferior T9 vertebral body. Nonspecific ill-defined sclerosis within T7 vertebral body. Remote sternal manubrial fracture. IMPRESSION: 1. Left apical density on radiograph corresponds to masslike consolidative and ground-glass opacity, 3.2 cm. Differential considerations include neoplasm versus pneumonia in the setting of cough and fever. Workup options include short interval follow-up CT after course of antibiotics to evaluate for any interval change versus PET-CT characterization, however infection may also be positive on PET. Prominent lower paratracheal an AP window nodes. 2. Incidental segmental pulmonary embolus in the right middle lobe. 3. A left breast 2 cm nodule is nonspecific, recommend mammographic evaluation to evaluate for malignancy. 4. Indeterminate 17 mm lesion in the upper left kidney. Recommend MRI characterization if patient is able to tolerate to evaluate for complex cyst versus neoplasm. Alternatively, if there are prior exams performed elsewhere, comparison  with any prior imaging may be helpful. 5. Possible fluid density liver lesion, incompletely characterized on this exam, but likely cyst. 6. Aortic Atherosclerosis (ICD10-I70.0). Coronary artery calcifications. 7. T9 compression fracture appears remote. Nonspecific ill-defined sclerosis within T7 vertebral body, if neoplasm is confirmed, metastatic disease not excluded. Critical Value/emergent results were called by telephone at the time of interpretation on 03/10/2017 at 9:28 pm to Dr. Hal Hope , who verbally acknowledged these results. Electronically Signed   By: Jeb Levering M.D.   On: 03/10/2017 21:32   Dg Hip Unilat W Or Wo Pelvis 2-3 Views Right  Result Date: 04/01/2017 CLINICAL DATA:  Right hip pain after a fall. EXAM: DG HIP (WITH OR WITHOUT PELVIS) 2-3V RIGHT COMPARISON:  None. FINDINGS: Acute fracture of the right femoral neck with superior subluxation of the distal fracture fragment resulting in varus angulation. No definite extension into the inter trochanteric region. No glenohumeral dislocation. Fractures of the  superior and inferior right pubic rami. Sclerosis and irregularity of the right iliac wing could indicate fracture deformity or possibly bone graft donor site. This is incompletely included within the field of view. Consider CT for further evaluation clinically indicated. Postoperative changes noted in the lower lumbar spine. Surgical clips in the right pelvis. IMPRESSION: Acute fracture of the right femoral neck with varus angulation. Fractures of the right superior and inferior pubic rami. Possible fracture versus bone graft donor site in the right iliac wing. Electronically Signed   By: Lucienne Capers M.D.   On: 04/01/2017 06:11    Microbiology: Recent Results (from the past 240 hour(s))  Culture, blood (Routine X 2) w Reflex to ID Panel     Status: None (Preliminary result)   Collection Time: 04/01/17  8:30 AM  Result Value Ref Range Status   Specimen Description BLOOD  BLOOD RIGHT HAND  Final   Special Requests   Final    BOTTLES DRAWN AEROBIC AND ANAEROBIC Blood Culture adequate volume   Culture NO GROWTH 2 DAYS  Final   Report Status PENDING  Incomplete  Culture, blood (Routine X 2) w Reflex to ID Panel     Status: None (Preliminary result)   Collection Time: 04/01/17  8:30 AM  Result Value Ref Range Status   Specimen Description BLOOD RIGHT ANTECUBITAL  Final   Special Requests IN PEDIATRIC BOTTLE Blood Culture adequate volume  Final   Culture NO GROWTH 2 DAYS  Final   Report Status PENDING  Incomplete  Culture, Urine     Status: Abnormal   Collection Time: 04/01/17  6:57 PM  Result Value Ref Range Status   Specimen Description URINE, CLEAN CATCH  Final   Special Requests NONE  Final   Culture MULTIPLE SPECIES PRESENT, SUGGEST RECOLLECTION (A)  Final   Report Status 04/03/2017 FINAL  Final  Surgical pcr screen     Status: None   Collection Time: 04/01/17  9:44 PM  Result Value Ref Range Status   MRSA, PCR NEGATIVE NEGATIVE Final   Staphylococcus aureus NEGATIVE NEGATIVE Final    Comment: (NOTE) The Xpert SA Assay (FDA approved for NASAL specimens in patients 26 years of age and older), is one component of a comprehensive surveillance program. It is not intended to diagnose infection nor to guide or monitor treatment.      Labs: Basic Metabolic Panel:  Recent Labs Lab 04/01/17 0530 04/01/17 0536 04/02/17 0406 04/03/17 0403 04/04/17 0254 04/04/17 1358  NA 139 141 135 138 136  --   K 4.3 4.3 4.1 4.8 5.2* 4.6  CL 105 103 104 104 104  --   CO2 27  --  26 27 28   --   GLUCOSE 116* 110* 119* 117* 104*  --   BUN 16 19 15 14 17   --   CREATININE 0.84 0.80 0.68 0.63 0.66  --   CALCIUM 9.4  --  8.7* 9.0 9.2  --    Liver Function Tests: No results for input(s): AST, ALT, ALKPHOS, BILITOT, PROT, ALBUMIN in the last 168 hours. No results for input(s): LIPASE, AMYLASE in the last 168 hours. No results for input(s): AMMONIA in the last 168  hours. CBC:  Recent Labs Lab 04/01/17 0530 04/01/17 0536 04/02/17 0406 04/03/17 0403  WBC 9.7  --  8.4 10.0  NEUTROABS 6.8  --   --   --   HGB 15.0 15.3* 12.8 12.0  HCT 45.7 45.0 39.8 37.8  MCV 94.6  --  94.3 94.7  PLT 205  --  177 167   Cardiac Enzymes: No results for input(s): CKTOTAL, CKMB, CKMBINDEX, TROPONINI in the last 168 hours. BNP: BNP (last 3 results) No results for input(s): BNP in the last 8760 hours.  ProBNP (last 3 results) No results for input(s): PROBNP in the last 8760 hours.  CBG: No results for input(s): GLUCAP in the last 168 hours.     SignedCristal Ford  [facility] 04/04/2017, 3:44 PM

## 2017-04-04 NOTE — Progress Notes (Signed)
Physical Therapy Treatment Patient Details Name: Kristie [name] MRN: [id number] DOB: March 28, 1929 Today's Date: 04/04/2017    History of Present Illness 83 y.o. female with medical history significant for HTN, HLD, chronic back pain, fibromyalgia, insomnia, depression, spinal stenosis of the lumbar area, chronic generalized weakness, mild memory loss, recent UTI, on Bactrim, as well as recent history of PE from DVT. Pt fell and is now s/p right hip hemiarthroplasty with pubic rami fx    PT Comments    Nurse tech approached PTA in hallway as patient was having increased difficulty transferring out of recliner for transfer back to bed.  PTA entered room and assisted and instructed patient through transfer back to bed.  Pt monitored for safety with compliance to her hip precautions during transfer.      Follow Up Recommendations  SNF;Supervision/Assistance - 24 hour     Equipment Recommendations  None recommended by PT    Recommendations for Other Services       Precautions / Restrictions Precautions Precautions: Fall;Posterior Hip Precaution Booklet Issued: Yes (comment) Precaution Comments: pt was educated on hip precautions during session. Required Braces or Orthoses: Other Brace/Splint (R AFO for chronic foot drop) Restrictions Weight Bearing Restrictions: Yes RLE Weight Bearing: Weight bearing as tolerated    Mobility  Bed Mobility               General bed mobility comments: Pt left sitting edge of bed with nurse tech present.    Transfers Overall transfer level: Needs assistance Equipment used: Rolling walker (2 wheeled) Transfers: Sit to/from Stand Sit to Stand: Mod assist;+2 physical assistance         General transfer comment: Pt fatigued in chair and required +2 mod assist to achieve standing.  Pt educated on hand placement and LE placement during session.    Ambulation/Gait Ambulation/Gait assistance: Min assist;+2 physical assistance Ambulation Distance  (Feet):  (steps from recliner back to bed.  ) Assistive device: Rolling walker (2 wheeled) Gait Pattern/deviations: Step-to pattern;Decreased stance time - right Gait velocity: decreased Gait velocity interpretation: Below normal speed for age/gender General Gait Details: Cues for RW safety, turns and backing ( sequencing when backing).     Stairs            Wheelchair Mobility    Modified Rankin (Stroke Patients Only)       Balance Overall balance assessment: Needs assistance   Sitting balance-Leahy Scale: Good       Standing balance-Leahy Scale: Poor                              Cognition                                       General Comments: Not formally assessed as PTA assisting patient back to bed.        Exercises      General Comments        Pertinent Vitals/Pain Pain Assessment: Faces Faces Pain Scale: Hurts even more Pain Descriptors / Indicators: Aching;Sore Pain Intervention(s): Monitored during session    Home Living                      Prior Function            PT Goals (current goals can now be found in the care  plan section) Acute Rehab PT Goals Patient Stated Goal: wants to get back into her bed Potential to Achieve Goals: Good Progress towards PT goals: Progressing toward goals    Frequency    Min 3X/week      PT Plan Current plan remains appropriate;Frequency needs to be updated    Co-evaluation              AM-PAC PT "6 Clicks" Daily Activity  Outcome Measure  Difficulty turning over in bed (including adjusting bedclothes, sheets and blankets)?: A Little Difficulty moving from lying on back to sitting on the side of the bed? : A Little Difficulty sitting down on and standing up from a chair with arms (e.g., wheelchair, bedside commode, etc,.)?: Unable Help needed moving to and from a bed to chair (including a wheelchair)?: A Lot Help needed walking in hospital room?: A  Lot Help needed climbing 3-5 steps with a railing? : Total 6 Click Score: 12    End of Session Equipment Utilized During Treatment: Gait belt Activity Tolerance: Patient limited by fatigue Patient left: in bed;with nursing/sitter in room Nurse Communication: Mobility status PT Visit Diagnosis: History of falling (Z91.81);Pain;Other abnormalities of gait and mobility (R26.89) Pain - Right/Left: Right Pain - part of body: Hip     Time: 1430-1445 PT Time Calculation (min) (ACUTE ONLY): 15 min  Charges:  $Therapeutic Activity: 8-22 mins                    G CodesGovernor Rooks, PTA pager [phone]    Cristela Blue 04/04/2017, 2:51 PM

## 2017-04-04 NOTE — Progress Notes (Signed)
Physical Therapy Treatment Patient Details Name: Kristie [name] MRN: [id number] DOB: 05-01-1929 Today's Date: 04/04/2017    History of Present Illness 83 y.o. female with medical history significant for HTN, HLD, chronic back pain, fibromyalgia, insomnia, depression, spinal stenosis of the lumbar area, chronic generalized weakness, mild memory loss, recent UTI, on Bactrim, as well as recent history of PE from DVT. Pt fell and is now s/p right hip hemiarthroplasty with pubic rami fx    PT Comments    Pt pleasant and willing to mobilize. Pt able to recall 2/3 precautions at beginning and end of session with handout for all 3 precautions provided. Pt educated for HEP, transfers, gait and function. Will continue to follow to maximize mobility and safety prior to D/C.   SpO2 95% on RA    Follow Up Recommendations  SNF;Supervision/Assistance - 24 hour     Equipment Recommendations  None recommended by PT    Recommendations for Other Services       Precautions / Restrictions Precautions Precautions: Fall;Posterior Hip Precaution Booklet Issued: Yes (comment) Precaution Comments: pt was educated on hip precautions during session. Restrictions RLE Weight Bearing: Weight bearing as tolerated    Mobility  Bed Mobility Overal bed mobility: Needs Assistance Bed Mobility: Supine to Sit     Supine to sit: Supervision     General bed mobility comments: pt able to pivot to right side of bed with rail and increased time  Transfers Overall transfer level: Needs assistance   Transfers: Sit to/from Stand Sit to Stand: +2 safety/equipment;Mod assist         General transfer comment: cues for hand and RLE placement with assist to block RLE out of knee flexion with sit and stand. PT with mod assist to rise and mod assist to control descent at pt sitting quicly with limited warning requiing max assist to move RLE prior to sitting  Ambulation/Gait Ambulation/Gait assistance: Min assist;+2  safety/equipment Ambulation Distance (Feet): 50 Feet Assistive device: Rolling walker (2 wheeled) Gait Pattern/deviations: Step-to pattern;Decreased stance time - right   Gait velocity interpretation: Below normal speed for age/gender General Gait Details: cues for posture, sequence and position in RW, chair to follow due to fatigue   Stairs            Wheelchair Mobility    Modified Rankin (Stroke Patients Only)       Balance Overall balance assessment: Needs assistance   Sitting balance-Leahy Scale: Good       Standing balance-Leahy Scale: Poor                              Cognition Arousal/Alertness: Awake/alert Behavior During Therapy: WFL for tasks assessed/performed Overall Cognitive Status: Impaired/Different from baseline Area of Impairment: Memory                     Memory: Decreased recall of precautions         General Comments: pt with decreased recall of all precautions able to state 2/3 end of session after re-education at initiation of session      Exercises General Exercises - Lower Extremity Long Arc Quad: AROM;Right;Seated;10 reps Heel Slides: AROM;Right;Supine;10 reps Hip ABduction/ADduction: AROM;Right;Supine;10 reps    General Comments        Pertinent Vitals/Pain Pain Score: 5  Pain Location: Rt hip Pain Descriptors / Indicators: Aching;Sore Pain Intervention(s): Limited activity within patient's tolerance;Repositioned;Other (comment) (pt stating she would call RN  for meds)    Home Living                      Prior Function            PT Goals (current goals can now be found in the care plan section) Progress towards PT goals: Progressing toward goals    Frequency    Min 3X/week      PT Plan Current plan remains appropriate;Frequency needs to be updated    Co-evaluation              AM-PAC PT "6 Clicks" Daily Activity  Outcome Measure  Difficulty turning over in bed  (including adjusting bedclothes, sheets and blankets)?: A Little Difficulty moving from lying on back to sitting on the side of the bed? : A Little Difficulty sitting down on and standing up from a chair with arms (e.g., wheelchair, bedside commode, etc,.)?: Unable Help needed moving to and from a bed to chair (including a wheelchair)?: A Lot Help needed walking in hospital room?: A Lot Help needed climbing 3-5 steps with a railing? : Total 6 Click Score: 12    End of Session Equipment Utilized During Treatment: Gait belt Activity Tolerance: Patient tolerated treatment well Patient left: in chair;with call bell/phone within reach;with chair alarm set;with family/visitor present Nurse Communication: Mobility status PT Visit Diagnosis: History of falling (Z91.81);Pain;Other abnormalities of gait and mobility (R26.89) Pain - Right/Left: Right Pain - part of body: Hip     Time: 8366-2947 PT Time Calculation (min) (ACUTE ONLY): 24 min  Charges:  $Gait Training: 8-22 mins $Therapeutic Exercise: 8-22 mins                    G Codes:       Elwyn Reach, PT [phone]    [place] 04/04/2017, 10:22 AM

## 2017-04-04 NOTE — Social Work (Signed)
Clinical Social Worker facilitated patient discharge including contacting patient family and facility to confirm patient discharge plans.  Clinical information faxed to facility and family agreeable with plan.    CSW arranged ambulance transport via [facility] to [facility].    RN to call [phone] ext. [phone] or [phone] Ask for But[facility] Penny, [facility]) to give report prior to discharge.  Clinical Social Worker will sign off for now as social work intervention is no longer needed. Please consult [place] again if new need arises.  Elissa Hefty, LCSW Clinical Social Worker [phone]

## 2017-04-04 NOTE — Progress Notes (Signed)
Patient ID: Kristie [name], female   DOB: [birth year], 83 y.o.   MRN: [id number] Patient without complaints this morning. Anticipate discharge to the skilled nursing facility at patient's residence. I will follow-up in the office in 2 weeks. Weightbearing as tolerated Tylenol for pain aspirin for DVT prophylaxis.

## 2017-04-04 NOTE — Clinical Social Work Placement (Signed)
   CLINICAL SOCIAL WORK PLACEMENT  NOTE  Date:  04/04/2017  Patient Details  Name: Kristie [name] MRN: [id number] Date of Birth: May 15, 1929  Clinical Social Work is seeking post-discharge placement for this patient at the [place] level of care (*CSW will initial, date and re-position this form in  chart as items are completed):  Yes   Patient/family provided with [place] Work Department's list of facilities offering this level of care within the geographic area requested by the patient (or if unable, by the patient's family).  Yes   Patient/family informed of their freedom to choose among providers that offer the needed level of care, that participate in Medicare, Medicaid or managed care program needed by the patient, have an available bed and are willing to accept the patient.  Yes   Patient/family informed of [facility]'s ownership interest in [facility] and [facility], as well as of the fact that they are under no obligation to receive care at these facilities.  PASRR submitted to EDS on 04/03/17     PASRR number received on 04/03/17     Existing PASRR number confirmed on       FL2 transmitted to all facilities in geographic area requested by pt/family on 04/03/17     FL2 transmitted to all facilities within larger geographic area on       Patient informed that his/her managed care company has contracts with or will negotiate with certain facilities, including the following:            Patient/family informed of bed offers received.  Patient chooses bed at [facility]     Physician recommends and patient chooses bed at      Patient to be transferred to [name] on 04/04/17.  Patient to be transferred to facility by [facility]     Patient family notified on 04/04/17 of transfer.  Name of family member notified:  spouse at bedside     PHYSICIAN Please sign FL2, Please sign DNR, Please prepare priority  discharge summary, including medications     Additional Comment:    _______________________________________________ Normajean Baxter, LCSW 04/04/2017, 3:52 PM

## 2017-04-04 NOTE — Progress Notes (Signed)
Pt [facility] by [facility]. Husband driving over with patient belongings. All information/records given to transporters. Report called to Butch Penny. Blood pressure (!) 150/82, pulse 91, temperature 98.1 F (36.7 C), temperature source Oral, resp. rate 16, height 5\' 8"  (1.727 m), weight 84.8 kg (187 lb), SpO2 95 %.

## 2017-04-04 NOTE — Social Work (Addendum)
CSW called [facility] and left message for Joellen Jersey to discuss transition back to facility. The facility returned call and confirmed that they can provide skilled nursing needs.  CSW will continue to follow for disposition.  Elissa Hefty, LCSW Clinical Social Worker [phone]

## 2017-04-04 NOTE — Progress Notes (Signed)
Pt offered a stool softener multiple times today for bowel movement. She politely declined stating she will take a stool softener when she gets to [place] she states she does not feel constipated and really wants to wait and take her supply at her facility.

## 2017-04-04 NOTE — Care Management Note (Signed)
Case Management Note  Patient Details  Name: Kristie [name] MRN: [id number] Date of Birth: 1929-04-21  Subjective/Objective:                    Action/Plan: Pt discharging to [facility]. No further needs per CM.   Expected Discharge Date:  04/04/17               Expected Discharge Plan:  Skilled Nursing Facility  In-House Referral:  Clinical Social Work  Discharge planning Services     Post Acute Care Choice:    Choice offered to:     DME Arranged:    DME Agency:     HH Arranged:    [place] Agency:     Status of Service:  Completed, signed off  If discussed at [employer], dates discussed:    Additional Comments:  Pollie Friar, RN 04/04/2017, 4:09 PM

## 2017-04-05 ENCOUNTER — Telehealth (INDEPENDENT_AMBULATORY_CARE_PROVIDER_SITE_OTHER): Payer: Self-pay | Admitting: Orthopedic Surgery

## 2017-04-05 ENCOUNTER — Non-Acute Institutional Stay (SKILLED_NURSING_FACILITY): Payer: Medicare Other | Admitting: Internal Medicine

## 2017-04-05 ENCOUNTER — Encounter: Payer: Self-pay | Admitting: Internal Medicine

## 2017-04-05 ENCOUNTER — Encounter: Payer: Medicare Other | Admitting: Internal Medicine

## 2017-04-05 DIAGNOSIS — N390 Urinary tract infection, site not specified: Secondary | ICD-10-CM

## 2017-04-05 DIAGNOSIS — N289 Disorder of kidney and ureter, unspecified: Secondary | ICD-10-CM

## 2017-04-05 DIAGNOSIS — M21371 Foot drop, right foot: Secondary | ICD-10-CM

## 2017-04-05 DIAGNOSIS — N3281 Overactive bladder: Secondary | ICD-10-CM

## 2017-04-05 DIAGNOSIS — R4189 Other symptoms and signs involving cognitive functions and awareness: Secondary | ICD-10-CM

## 2017-04-05 DIAGNOSIS — F329 Major depressive disorder, single episode, unspecified: Secondary | ICD-10-CM

## 2017-04-05 DIAGNOSIS — I2699 Other pulmonary embolism without acute cor pulmonale: Secondary | ICD-10-CM

## 2017-04-05 DIAGNOSIS — M792 Neuralgia and neuritis, unspecified: Secondary | ICD-10-CM

## 2017-04-05 DIAGNOSIS — R531 Weakness: Secondary | ICD-10-CM | POA: Diagnosis not present

## 2017-04-05 DIAGNOSIS — I10 Essential (primary) hypertension: Secondary | ICD-10-CM | POA: Diagnosis not present

## 2017-04-05 DIAGNOSIS — R2681 Unsteadiness on feet: Secondary | ICD-10-CM

## 2017-04-05 DIAGNOSIS — B9620 Unspecified Escherichia coli [E. coli] as the cause of diseases classified elsewhere: Secondary | ICD-10-CM

## 2017-04-05 DIAGNOSIS — S72001A Fracture of unspecified part of neck of right femur, initial encounter for closed fracture: Secondary | ICD-10-CM

## 2017-04-05 DIAGNOSIS — F32A Depression, unspecified: Secondary | ICD-10-CM

## 2017-04-05 NOTE — Progress Notes (Signed)
Provider:  Blanchie Serve MD  Location:  [facility] Room Number: 58 Place of Service:  SNF (31)  PCP: Blanchie Serve, MD Patient Care Team: Blanchie Serve, MD as PCP - General (Internal Medicine) Bo Merino, MD as Consulting Physician (Rheumatology) Juanita Craver, MD as Consulting Physician (Gastroenterology) Vickey Huger, MD as Consulting Physician (Orthopedic Surgery) Calvert Cantor, MD as Consulting Physician (Ophthalmology) Mast, Man X, NP as Nurse Practitioner (Internal Medicine)  Extended Emergency Contact Information Primary Emergency Contact: Gruendler,Joseph Address: [address] [place] Phone: [phone] Mobile Phone: [phone] Relation: Spouse  Code Status: DNR Goals of Care: Advanced Directive information Advanced Directives 04/05/2017  Does Patient Have a Medical Advance Directive? Yes  Type of Advance Directive Living will;Healthcare Power of [place];Out of facility DNR (pink MOST or yellow form)  Does patient want to make changes to medical advance directive? No - Patient declined  Copy of [place] in Chart? Yes  Would patient like information on creating a medical advance directive? -  Pre-existing out of facility DNR order (yellow form or pink MOST form) Yellow form placed in chart (order not valid for inpatient use)      Chief Complaint  Patient presents with  . New Admit To SNF    New Admission Visit     HPI: Patient is a 83 y.o. female seen today for admission visit. She was in the hospital from 04/01/17-04/04/17 post fall with right femoral neck fracture. She underwent hemiarthroplasty on 04/02/17. She was placed on antibiotic for UTI. She is here for rehabilitation. She is seen in her room today with her husband at bedside.   Past Medical History:  Diagnosis Date  . Cataract   . Fibromyalgia 04/25/2014  . Hyperlipidemia   . Hypertension   .  Lesion of sciatic nerve   . Low back pain   . Memory loss   . Spinal stenosis of lumbar region 04/25/2014  . Tremor   . Weakness    Past Surgical History:  Procedure Laterality Date  . ABDOMINAL HYSTERECTOMY  1963  . APPENDECTOMY    . CATARACT EXTRACTION W/ INTRAOCULAR LENS  IMPLANT, BILATERAL  2005 and 2007  . COLON SURGERY  1974   colon polyp  . COLON SURGERY  2002   colon polyps(2)  Dr Collene Mares  . COLONOSCOPY W/ POLYPECTOMY  1974and 2002  . EYE SURGERY  2005/2007   cataract renoval  Dr. Bing Plume  . HIP ARTHROPLASTY Right 04/02/2017   Procedure: ARTHROPLASTY BIPOLAR HIP (HEMIARTHROPLASTY);  Surgeon: Newt Minion, MD;  Location: [facility];  Service: Orthopedics;  Laterality: Right;  . KNEE SURGERY  2009   Dr. Ronnie Derby  . LUMBAR LAMINECTOMY  2012   L4-5 with spinous process fixation (06/22/11) Dr. Tommi Emery  . schwanoma  1987   residual right foot drop and right leg pain. Dr. Deirdre Pippins  . SMALL INTESTINE SURGERY  1990   bowel obstruction, Dr. Rebekah Chesterfield  . TONSILLECTOMY  1940    reports that she quit smoking about 42 years ago. She has never used smokeless tobacco. She reports that she drinks alcohol. She reports that she does not use drugs. Social History   Social History  . Marital status: Married    Spouse name: N/A  . Number of children: 4  . Years of education: N/A   Occupational History  . retired Public relations account executive  Retired    retired  Social History Main Topics  . Smoking status: Former Smoker    Quit date: 04/25/1974  . Smokeless tobacco: Never Used     Comment: Quit thirty years ago.  . Alcohol use Yes  . Drug use: No  . Sexual activity: Not on file   Other Topics Concern  . Not on file   Social History Narrative   Patient is retired Public relations account executive and lives with her husband, (married since 1980) at [place] since 2013   Patient  is right handed. She drinks 1 cup of coffee per day.    She does not have any pets.   Former smoker for 20 years   Exercise none                           Functional Status Survey:    Family History  Problem Relation Age of Onset  . Breast cancer Mother   . Heart disease Mother   . Dementia Father   . Cancer Sister        Esophageal     Health Maintenance  Topic Date Due  . INFLUENZA VACCINE  02/23/2017  . DEXA SCAN  07/26/2017 (Originally 08/21/1995)  . TETANUS/TDAP  07/26/2017 (Originally 08/20/1947)  . PNA vac Low Risk Adult (1 of 2 - PCV13) 07/26/2017 (Originally 08/21/1995)    Allergies  Allergen Reactions  . Restoril [Temazepam] Other (See Comments)    Nightmares  . Bactrim [Sulfamethoxazole-Trimethoprim] Nausea And Vomiting    Outpatient Encounter Prescriptions as of 04/05/2017  Medication Sig  . acetaminophen (TYLENOL) 500 MG tablet Take 1 tablet (500 mg total) by mouth every 6 (six) hours as needed for mild pain.  Marland Kitchen amLODipine (NORVASC) 10 MG tablet Take 1 tablet (10 mg total) by mouth daily.  Marland Kitchen apixaban (ELIQUIS) 5 MG TABS tablet Take 1 tablet (5 mg total) by mouth 2 (two) times daily.  . cholecalciferol (VITAMIN D) 1000 UNITS tablet Take 2,000 Units by mouth daily.   . diclofenac sodium (VOLTAREN) 1 % GEL APPLY 4 GRAMS TO 3 LARGE JOINTS UP TO 3 TIMES A DAY AS NEEDED  . donepezil (ARICEPT) 10 MG tablet Take 10 mg by mouth 2 (two) times daily.  . eszopiclone (LUNESTA) 1 MG TABS tablet Take 1 mg by mouth at bedtime. Take immediately before bedtime  . fluticasone (FLONASE) 50 MCG/ACT nasal spray Place 2 sprays into both nostrils daily. 2 sprays in each nostril once daily  . gabapentin (NEURONTIN) 800 MG tablet TAKE 1 TABLET 3 TIMES A DAY.  . Lactobacillus (PROBIOTIC ACIDOPHILUS PO) Take 1 capsule by mouth daily.  Marland Kitchen losartan (COZAAR) 50 MG tablet Take 50 mg by mouth every other day.  . metoprolol tartrate (LOPRESSOR) 25 MG tablet TAKE 1 TABLET TWICE DAILY TO CONTROL BLOOD PRESSURE.  . mirabegron ER (MYRBETRIQ) 25 MG TB24 tablet Take 25 mg by mouth daily.  . sertraline (ZOLOFT) 50 MG tablet TAKE 1  TABLET EACH DAY.  . simvastatin (ZOCOR) 10 MG tablet Take 1 tablet (10 mg total) by mouth every other day.  . solifenacin (VESICARE) 10 MG tablet Take 1 tablet (10 mg total) by mouth daily.  Marland Kitchen sulfamethoxazole-trimethoprim (BACTRIM DS,SEPTRA DS) 800-160 MG tablet Take 1 tablet by mouth 2 (two) times daily.  . [DISCONTINUED] aspirin EC 325 MG tablet Take 1 tablet (325 mg total) by mouth daily.  . [DISCONTINUED] losartan (COZAAR) 50 MG tablet Take 1 tablet (50 mg total) by mouth daily.  . [DISCONTINUED]  zolpidem (AMBIEN) 10 MG tablet TAKE 1 TABLET AT BEDTIME AS NEEDED. (Patient taking differently: TAKE 1 TABLET (10mg ) AT BEDTIME AS NEEDED if woke up in the middle of the night.)   No facility-administered encounter medications on file as of 04/05/2017.     Review of Systems  Constitutional: Negative for appetite change, chills and fever.  HENT: Negative for congestion, ear discharge, ear pain, hearing loss, mouth sores, postnasal drip, sinus pain, sinus pressure, sore throat and trouble swallowing.   Respiratory: Negative for cough and shortness of breath.   Cardiovascular: Negative for chest pain, palpitations and leg swelling.  Gastrointestinal: Positive for constipation. Negative for abdominal pain, blood in stool, diarrhea, nausea and vomiting.       Last bowel movement was today (small). Passing gas.   Genitourinary: Negative for dysuria, frequency and pelvic pain.  Musculoskeletal: Positive for arthralgias and gait problem.       Right hip pain with movement, uses AFO brace to RLE  Skin: Negative for rash and wound.  Neurological: Negative for dizziness, seizures, syncope and light-headedness.  Hematological: Bruises/bleeds easily.  Psychiatric/Behavioral: Negative for behavioral problems and confusion. The patient is not nervous/anxious.     Vitals:   04/05/17 1250  BP: 128/74  Pulse: 70  Resp: 18  Temp: 98.2 F (36.8 C)  TempSrc: Oral  SpO2: 95%  Weight: 187 lb (84.8 kg)    Height: 5\' 8"  (1.727 m)   Body mass index is 28.43 kg/m. Physical Exam  Constitutional: She is oriented to person, place, and time. She appears well-developed and well-nourished. No distress.  HENT:  Head: Normocephalic and atraumatic.  Mouth/Throat: Oropharynx is clear and moist.  Eyes: Pupils are equal, round, and reactive to light. Conjunctivae and EOM are normal.  Neck: Normal range of motion. Neck supple. No JVD present. No thyromegaly present.  Cardiovascular: Normal rate and regular rhythm.   Pulmonary/Chest: Effort normal and breath sounds normal. No respiratory distress. She has no wheezes. She has no rales.  Abdominal: Soft. Bowel sounds are normal. She exhibits no distension. There is no tenderness. There is no guarding.  Musculoskeletal: She exhibits edema.  Left foot and leg edema noted, right leg in AFO brace, limited right hip ROM  Lymphadenopathy:    She has no cervical adenopathy.  Neurological: She is alert and oriented to person, place, and time.  Skin: Skin is warm and dry. She is not diaphoretic.  Right hip surgical incision with 12 staples, no active bleed, bruise around the incision area  Psychiatric: She has a normal mood and affect. Her behavior is normal.    Labs reviewed: Basic Metabolic Panel:  Recent Labs  03/11/17 0517 03/12/17 0533 03/21/17 0826  04/02/17 0406 04/03/17 0403 04/04/17 0254 04/04/17 1358  NA 142 143 142  < > 135 138 136  --   K 3.8 3.5 3.7  < > 4.1 4.8 5.2* 4.6  CL 109 106 106  < > 104 104 104  --   CO2 26 28 28   < > 26 27 28   --   GLUCOSE 96 103* 95  < > 119* 117* 104*  --   BUN 5* 7 15  < > 15 14 17   --   CREATININE 0.68 0.63 0.63  < > 0.68 0.63 0.66  --   CALCIUM 8.5* 8.5* 8.8  < > 8.7* 9.0 9.2  --   MG 2.0 2.1 2.1  --   --   --   --   --  PHOS 2.9 3.2 3.1  --   --   --   --   --   < > = values in this interval not displayed. Liver Function Tests:  Recent Labs  03/11/17 0517 03/12/17 0533 03/21/17 0826  AST 24 22  15   ALT 25 26 18   ALKPHOS 47 48 56  BILITOT 0.5 0.5 0.5  PROT 5.5* 5.4* 6.2  ALBUMIN 2.7* 2.6* 3.7   No results for input(s): LIPASE, AMYLASE in the last 8760 hours. No results for input(s): AMMONIA in the last 8760 hours. CBC:  Recent Labs  03/12/17 0533 03/21/17 0826 04/01/17 0530 04/01/17 0536 04/02/17 0406 04/03/17 0403  WBC 6.8 5.9 9.7  --  8.4 10.0  NEUTROABS 4.0 2,773 6.8  --   --   --   HGB 12.6 14.1 15.0 15.3* 12.8 12.0  HCT 39.1 41.7 45.7 45.0 39.8 37.8  MCV 95.4 41.7* 94.6  --  94.3 94.7  PLT 203 289 205  --  177 167   Cardiac Enzymes: No results for input(s): CKTOTAL, CKMB, CKMBINDEX, TROPONINI in the last 8760 hours. BNP: Invalid input(s): POCBNP Lab Results  Component Value Date   HGBA1C 5.0 01/19/2017   Lab Results  Component Value Date   TSH 1.897 09/13/2016   Lab Results  Component Value Date   [id number] 742 09/13/2016   No results found for: FOLATE No results found for: IRON, TIBC, FERRITIN  Imaging and Procedures obtained prior to SNF admission: Dg Chest 1 View  Result Date: 04/01/2017 CLINICAL DATA:  Fall.  Right hip fracture. EXAM: CHEST 1 VIEW COMPARISON:  CT chest and chest 03/10/2017 FINDINGS: Heart size and pulmonary vascularity are normal. Shallow inspiration with atelectasis in the right lung base. Nodular opacity in the left apex measuring 1.9 cm diameter. This was present on previous CT chest 03/10/2017 and may be decreasing in size. Follow-up CT or PET-CT scan 3 months after the previous CT remains indicated. No airspace disease or consolidation. No blunting of costophrenic angles. No pneumothorax. Calcified and tortuous aorta. IMPRESSION: Shallow inspiration with slight atelectasis in the right lung base. No evidence of active pulmonary disease. Left apical nodule measuring 19 mm may be decreasing since a previous CT scan. Follow-up recommended. Electronically Signed   By: Lucienne Capers M.D.   On: 04/01/2017 06:15   Dg Hip Unilat W  Or Wo Pelvis 2-3 Views Right  Result Date: 04/01/2017 CLINICAL DATA:  Right hip pain after a fall. EXAM: DG HIP (WITH OR WITHOUT PELVIS) 2-3V RIGHT COMPARISON:  None. FINDINGS: Acute fracture of the right femoral neck with superior subluxation of the distal fracture fragment resulting in varus angulation. No definite extension into the inter trochanteric region. No glenohumeral dislocation. Fractures of the superior and inferior right pubic rami. Sclerosis and irregularity of the right iliac wing could indicate fracture deformity or possibly bone graft donor site. This is incompletely included within the field of view. Consider CT for further evaluation clinically indicated. Postoperative changes noted in the lower lumbar spine. Surgical clips in the right pelvis. IMPRESSION: Acute fracture of the right femoral neck with varus angulation. Fractures of the right superior and inferior pubic rami. Possible fracture versus bone graft donor site in the right iliac wing. Electronically Signed   By: Lucienne Capers M.D.   On: 04/01/2017 06:11    Assessment/Plan  Unsteady gait With recent fall and right femoral neck fracture. Will have patient work with PT/OT as tolerated to regain strength and restore function.  Fall precautions are in place.  Right femoral neck fracture S/p hemiarthroplasty. Will need orthopedic follow up. Currently on acetaminophen 500 mg q6h prn pain. Pain under control. Monitor. Will have her work with PT and OT. Continue eliquis for DVT prophylaxis.   E.coli UTI Currently asymptomatic. Perineal hygiene and hydration to be maintained. To complete course of bactrim ds 1 tab q12h on 04/05/17.   Generalized weakness With deconditioning, recent hospitalization with recent pneumonia, UTI, femoral neck fracture and possible metastatic disease. Will have her work with physical therapy and occupational therapy team to help with gait training and muscle strengthening exercises.fall precautions.  Skin care. Encourage to be out of bed.   Hypertension Continue losartan 50 mg daily, amlodipine 10 mg daily and metoprolol tartrate 25 mg bid  Pulmonary Embolism Continue apixaban 5 mg bid and monitor  OAB Has urge incontinence. Continue mirabegron and solifenacin and monitor  Kidney lesion, native, left No further workup per patient request  Neuropathic pain Continue ehr gabapentin current regimen, no changes made  Chronic depression Continue sertraline 50 mg daily and monitor  Right foot drop Continue with right foot brace, fall prevention, has a walker  Cognitive impairment aao x 3 this visit. Continue donepezil   Family/ staff Communication: reviewed care plan with patient, her husband and charge nurse.    Labs/tests ordered: CBC, BMP in 1 week   Blanchie Serve, MD Internal Medicine [facility] [address] [place], [place] [address] Cell Phone (Monday-Friday 8 am - 5 pm): [phone] On Call: [phone] and follow prompts after 5 pm and on weekends Office Phone: [phone] Office Fax: [phone]

## 2017-04-05 NOTE — Telephone Encounter (Signed)
Advised Pat patient only needs to take eliquis, no aspirin needed per MD.

## 2017-04-05 NOTE — Telephone Encounter (Signed)
Pat from [facility] called asking for a clarification on the patients medications. When calling ask for [facility] unit CB # [phone]

## 2017-04-05 NOTE — Telephone Encounter (Signed)
I called and was transferred to a voicemail line. Asked to please call back and let us know what we can clarify for this patient. She is status post hemiarthroplasty.

## 2017-04-06 ENCOUNTER — Telehealth: Payer: Self-pay

## 2017-04-06 LAB — CULTURE, BLOOD (ROUTINE X 2)
CULTURE: NO GROWTH
CULTURE: NO GROWTH
SPECIAL REQUESTS: ADEQUATE
Special Requests: ADEQUATE

## 2017-04-06 NOTE — Telephone Encounter (Signed)
Possible re-admission to facility. This is a patient you were seeing at [facility] . [facility] F/U is needed if patient was re-admitted to facility upon discharge. Hospital discharge from [facility] on 04/04/17

## 2017-04-08 ENCOUNTER — Encounter: Payer: Self-pay | Admitting: Nurse Practitioner

## 2017-04-08 ENCOUNTER — Non-Acute Institutional Stay (SKILLED_NURSING_FACILITY): Payer: Medicare Other | Admitting: Nurse Practitioner

## 2017-04-08 DIAGNOSIS — S32511D Fracture of superior rim of right pubis, subsequent encounter for fracture with routine healing: Secondary | ICD-10-CM

## 2017-04-08 DIAGNOSIS — I824Y1 Acute embolism and thrombosis of unspecified deep veins of right proximal lower extremity: Secondary | ICD-10-CM | POA: Diagnosis not present

## 2017-04-08 DIAGNOSIS — S72001A Fracture of unspecified part of neck of right femur, initial encounter for closed fracture: Secondary | ICD-10-CM

## 2017-04-08 NOTE — Progress Notes (Signed)
Location:  [facility] Room Number: 46 Place of Service:  SNF (31) Provider:  Mast, Manxie  NP  Blanchie Serve, MD  Patient Care Team: Blanchie Serve, MD as PCP - General (Internal Medicine) Bo Merino, MD as Consulting Physician (Rheumatology) Juanita Craver, MD as Consulting Physician (Gastroenterology) Vickey Huger, MD as Consulting Physician (Orthopedic Surgery) Calvert Cantor, MD as Consulting Physician (Ophthalmology) Mast, Man X, NP as Nurse Practitioner (Internal Medicine)  Extended Emergency Contact Information Primary Emergency Contact: Gruendler,Joseph Address: [address], [place] Phone: [phone] Mobile Phone: [phone] Relation: Spouse  Code Status:  DNR Goals of care: Advanced Directive information Advanced Directives 04/08/2017  Does Patient Have a Medical Advance Directive? Yes  Type of Advance Directive Living will;Healthcare Power of [place];Out of facility DNR (pink MOST or yellow form)  Does patient want to make changes to medical advance directive? No - Patient declined  Copy of Amanda in Chart? Yes  Would patient like information on creating a medical advance directive? No - Patient declined  Pre-existing out of facility DNR order (yellow form or pink MOST form) Yellow form placed in chart (order not valid for inpatient use)     Chief Complaint  Patient presents with  . Acute Visit    Rt hip pain    HPI:  Pt is a 83 y.o. female seen today for an acute visit for    Past Medical History:  Diagnosis Date  . Cataract   . Fibromyalgia 04/25/2014  . Hyperlipidemia   . Hypertension   . Lesion of sciatic nerve   . Low back pain   . Memory loss   . Spinal stenosis of lumbar region 04/25/2014  . Tremor   . Weakness    Past Surgical History:  Procedure Laterality Date  . ABDOMINAL HYSTERECTOMY  1963  . APPENDECTOMY    . CATARACT  EXTRACTION W/ INTRAOCULAR LENS  IMPLANT, BILATERAL  2005 and 2007  . COLON SURGERY  1974   colon polyp  . COLON SURGERY  2002   colon polyps(2)  Dr Collene Mares  . COLONOSCOPY W/ POLYPECTOMY  1974and 2002  . EYE SURGERY  2005/2007   cataract renoval  Dr. Bing Plume  . HIP ARTHROPLASTY Right 04/02/2017   Procedure: ARTHROPLASTY BIPOLAR HIP (HEMIARTHROPLASTY);  Surgeon: Newt Minion, MD;  Location: [facility];  Service: Orthopedics;  Laterality: Right;  . KNEE SURGERY  2009   Dr. Ronnie Derby  . LUMBAR LAMINECTOMY  2012   L4-5 with spinous process fixation (06/22/11) Dr. Tommi Emery  . schwanoma  1987   residual right foot drop and right leg pain. Dr. Deirdre Pippins  . SMALL INTESTINE SURGERY  1990   bowel obstruction, Dr. Rebekah Chesterfield  . TONSILLECTOMY  1940    Allergies  Allergen Reactions  . Restoril [Temazepam] Other (See Comments)    Nightmares  . Bactrim [Sulfamethoxazole-Trimethoprim] Nausea And Vomiting    Outpatient Encounter Prescriptions as of 04/08/2017  Medication Sig  . acetaminophen (TYLENOL) 500 MG tablet Take 1 tablet (500 mg total) by mouth every 6 (six) hours as needed for mild pain.  Marland Kitchen amLODipine (NORVASC) 10 MG tablet Take 1 tablet (10 mg total) by mouth daily.  Marland Kitchen apixaban (ELIQUIS) 5 MG TABS tablet Take 1 tablet (5 mg total) by mouth 2 (two) times daily.  . cholecalciferol (VITAMIN D) 1000 UNITS tablet Take 2,000 Units by mouth daily.   . diclofenac  sodium (VOLTAREN) 1 % GEL APPLY 4 GRAMS TO 3 LARGE JOINTS UP TO 3 TIMES A DAY AS NEEDED  . donepezil (ARICEPT) 10 MG tablet Take 10 mg by mouth 2 (two) times daily.  . eszopiclone (LUNESTA) 1 MG TABS tablet Take 1 mg by mouth at bedtime. Take immediately before bedtime  . fluticasone (FLONASE) 50 MCG/ACT nasal spray Place 2 sprays into both nostrils daily. 2 sprays in each nostril once daily  . gabapentin (NEURONTIN) 800 MG tablet TAKE 1 TABLET 3 TIMES A DAY.  . Lactobacillus (PROBIOTIC ACIDOPHILUS PO) Take 1 capsule by mouth daily.  Marland Kitchen losartan  (COZAAR) 50 MG tablet Take 50 mg by mouth every other day.  . metoprolol tartrate (LOPRESSOR) 25 MG tablet TAKE 1 TABLET TWICE DAILY TO CONTROL BLOOD PRESSURE.  . mirabegron ER (MYRBETRIQ) 25 MG TB24 tablet Take 25 mg by mouth daily.  . sertraline (ZOLOFT) 50 MG tablet TAKE 1 TABLET EACH DAY.  . simvastatin (ZOCOR) 10 MG tablet Take 1 tablet (10 mg total) by mouth every other day.  . solifenacin (VESICARE) 10 MG tablet Take 1 tablet (10 mg total) by mouth daily.   No facility-administered encounter medications on file as of 04/08/2017.     Review of Systems  Immunization History  Administered Date(s) Administered  . Influenza Split 04/15/2012  . Influenza-Unspecified 05/06/2016   Pertinent  Health Maintenance Due  Topic Date Due  . INFLUENZA VACCINE  02/23/2017  . DEXA SCAN  07/26/2017 (Originally 08/21/1995)  . PNA vac Low Risk Adult (1 of 2 - PCV13) 07/26/2017 (Originally 08/21/1995)   Fall Risk  10/21/2016 04/25/2014  Falls in the past year? Yes No  Number falls in past yr: 1 -  Injury with Fall? No -  Risk for fall due to : Impaired balance/gait;Impaired mobility -  Follow up Falls evaluation completed -   Functional Status Survey:    Vitals:   04/08/17 1555  BP: 130/72  Pulse: 76  Resp: 16  Temp: 98.8 F (37.1 C)  SpO2: 93%  Weight: 189 lb 6.4 oz (85.9 kg)  Height: 5\' 8"  (1.727 m)   Body mass index is 28.8 kg/m. Physical Exam  Labs reviewed:  Recent Labs  03/11/17 0517 03/12/17 0533 03/21/17 0826  04/02/17 0406 04/03/17 0403 04/04/17 0254 04/04/17 1358  NA 142 143 142  < > 135 138 136  --   K 3.8 3.5 3.7  < > 4.1 4.8 5.2* 4.6  CL 109 106 106  < > 104 104 104  --   CO2 26 28 28   < > 26 27 28   --   GLUCOSE 96 103* 95  < > 119* 117* 104*  --   BUN 5* 7 15  < > 15 14 17   --   CREATININE 0.68 0.63 0.63  < > 0.68 0.63 0.66  --   CALCIUM 8.5* 8.5* 8.8  < > 8.7* 9.0 9.2  --   MG 2.0 2.1 2.1  --   --   --   --   --   PHOS 2.9 3.2 3.1  --   --   --   --   --    < > = values in this interval not displayed.  Recent Labs  03/11/17 0517 03/12/17 0533 03/21/17 0826  AST 24 22 15   ALT 25 26 18   ALKPHOS 47 48 56  BILITOT 0.5 0.5 0.5  PROT 5.5* 5.4* 6.2  ALBUMIN 2.7* 2.6* 3.7    Recent  Labs  03/12/17 0533 03/21/17 0826 04/01/17 0530 04/01/17 0536 04/02/17 0406 04/03/17 0403  WBC 6.8 5.9 9.7  --  8.4 10.0  NEUTROABS 4.0 2,773 6.8  --   --   --   HGB 12.6 14.1 15.0 15.3* 12.8 12.0  HCT 39.1 41.7 45.7 45.0 39.8 37.8  MCV 95.4 41.7* 94.6  --  94.3 94.7  PLT 203 289 205  --  177 167   Lab Results  Component Value Date   TSH 1.897 09/13/2016   Lab Results  Component Value Date   HGBA1C 5.0 01/19/2017   Lab Results  Component Value Date   CHOL 227 (H) 01/19/2017   HDL 56 01/19/2017   LDLCALC 132 (H) 01/19/2017   TRIG 194 (H) 01/19/2017   CHOLHDL 4.1 01/19/2017    Significant Diagnostic Results in last 30 days:  Dg Chest 1 View  Result Date: 04/01/2017 CLINICAL DATA:  Fall.  Right hip fracture. EXAM: CHEST 1 VIEW COMPARISON:  CT chest and chest 03/10/2017 FINDINGS: Heart size and pulmonary vascularity are normal. Shallow inspiration with atelectasis in the right lung base. Nodular opacity in the left apex measuring 1.9 cm diameter. This was present on previous CT chest 03/10/2017 and may be decreasing in size. Follow-up CT or PET-CT scan 3 months after the previous CT remains indicated. No airspace disease or consolidation. No blunting of costophrenic angles. No pneumothorax. Calcified and tortuous aorta. IMPRESSION: Shallow inspiration with slight atelectasis in the right lung base. No evidence of active pulmonary disease. Left apical nodule measuring 19 mm may be decreasing since a previous CT scan. Follow-up recommended. Electronically Signed   By: Lucienne Capers M.D.   On: 04/01/2017 06:15   Dg Chest 2 View  Result Date: 03/10/2017 CLINICAL DATA:  Cough, short of breath EXAM: CHEST  2 VIEW COMPARISON:  03/09/2017 FINDINGS:  Normal cardiac silhouette with ectatic aorta. The LEFT apical density is less pronounced but persistent and asymmetric with the RIGHT side. Lung bases are clear. No pulmonary edema. No osseous abnormality. IMPRESSION: Persistent LEFT apical density. Recommend CT thorax to exclude pulmonary nodule versus apical scarring. Electronically Signed   By: Suzy Bouchard M.D.   On: 03/10/2017 08:11   Ct Chest W Contrast  Result Date: 03/10/2017 CLINICAL DATA:  Persisting cough. Fever and weakness. Left apical density on radiograph. EXAM: CT CHEST WITH CONTRAST TECHNIQUE: Multidetector CT imaging of the chest was performed during intravenous contrast administration. CONTRAST:  75 cc Isovue 300 IV COMPARISON:  Chest radiograph earlier this day. FINDINGS: Cardiovascular: Filling defect within medial segmental pulmonary artery of the right middle lobe consistent with pulmonary embolus. Atherosclerosis of the thoracic aorta which is tortuous. No aneurysm. There are coronary artery calcifications. Heart is normal in size. No right heart strain. Mediastinum/Nodes: Enlarged lower paratracheal node measures 15 mm short axis. Small upper paratracheal nodes. 10 mm node in the AP window. Calcified left infrahilar lymph nodes, no noncalcified hilar adenopathy. No axillary adenopathy. No pericardial effusion. The esophagus is decompressed. Visualized thyroid gland is normal. Lungs/Pleura: Masslike consolidative opacity at the left lung apex with surrounding ground-glass, corresponding to radiographic abnormality. The consolidative component measures approximately 3.2 cm. This abuts the pleura posterior superiorly. Some surrounding spiculation adjacent to the ground-glass opacity. Scattered linear atelectasis, no additional consolidation. Calcified nodules in the right and left lower lobes consistent prior granulomatous disease. No definite additional noncalcified nodules. Minimal pleural thickening in both lower lungs without  pleural fluid. Upper Abdomen: Thickening of the left adrenal gland  without discrete nodule. Right adrenal gland is normal. 17 mm intermediate lesion in the left upper kidney. Calcified granuloma in the spleen. There is a 17 mm low-density structure abutting the medial right lobe of the liver that may be an exophytic liver lesion, incompletely characterized. Musculoskeletal: 2 cm nodule in the lateral left breast. Moderate compression fracture inferior T9 vertebral body. Nonspecific ill-defined sclerosis within T7 vertebral body. Remote sternal manubrial fracture. IMPRESSION: 1. Left apical density on radiograph corresponds to masslike consolidative and ground-glass opacity, 3.2 cm. Differential considerations include neoplasm versus pneumonia in the setting of cough and fever. Workup options include short interval follow-up CT after course of antibiotics to evaluate for any interval change versus PET-CT characterization, however infection may also be positive on PET. Prominent lower paratracheal an AP window nodes. 2. Incidental segmental pulmonary embolus in the right middle lobe. 3. A left breast 2 cm nodule is nonspecific, recommend mammographic evaluation to evaluate for malignancy. 4. Indeterminate 17 mm lesion in the upper left kidney. Recommend MRI characterization if patient is able to tolerate to evaluate for complex cyst versus neoplasm. Alternatively, if there are prior exams performed elsewhere, comparison with any prior imaging may be helpful. 5. Possible fluid density liver lesion, incompletely characterized on this exam, but likely cyst. 6. Aortic Atherosclerosis (ICD10-I70.0). Coronary artery calcifications. 7. T9 compression fracture appears remote. Nonspecific ill-defined sclerosis within T7 vertebral body, if neoplasm is confirmed, metastatic disease not excluded. Critical Value/emergent results were called by telephone at the time of interpretation on 03/10/2017 at 9:28 pm to Dr. Hal Hope , who  verbally acknowledged these results. Electronically Signed   By: Jeb Levering M.D.   On: 03/10/2017 21:32   Dg Hip Unilat W Or Wo Pelvis 2-3 Views Right  Result Date: 04/01/2017 CLINICAL DATA:  Right hip pain after a fall. EXAM: DG HIP (WITH OR WITHOUT PELVIS) 2-3V RIGHT COMPARISON:  None. FINDINGS: Acute fracture of the right femoral neck with superior subluxation of the distal fracture fragment resulting in varus angulation. No definite extension into the inter trochanteric region. No glenohumeral dislocation. Fractures of the superior and inferior right pubic rami. Sclerosis and irregularity of the right iliac wing could indicate fracture deformity or possibly bone graft donor site. This is incompletely included within the field of view. Consider CT for further evaluation clinically indicated. Postoperative changes noted in the lower lumbar spine. Surgical clips in the right pelvis. IMPRESSION: Acute fracture of the right femoral neck with varus angulation. Fractures of the right superior and inferior pubic rami. Possible fracture versus bone graft donor site in the right iliac wing. Electronically Signed   By: Lucienne Capers M.D.   On: 04/01/2017 06:11    Assessment/Plan There are no diagnoses linked to this encounter.   Family/ staff Communication:   Labs/tests ordered:

## 2017-04-08 NOTE — Assessment & Plan Note (Signed)
S/p hemiarthroplasty, surgical incision is intact, mild erythema peri wound, no signs of infection, pain is 10/10 while on Tylenol, will try Norco 5/325mg  q6h prn for 3 days. Observe.

## 2017-04-08 NOTE — Assessment & Plan Note (Signed)
03/11/17 venous US right leg peroneal vein DVT 04/08/17 continue Eliquis, no s/s of bleeding.

## 2017-04-08 NOTE — Assessment & Plan Note (Signed)
The right superior and inferior pubic rami fxs, pain is not well controlled, will trial Norco 5/325mg  q6h prn x 3 days, then re-eval.

## 2017-04-08 NOTE — Progress Notes (Signed)
Location:  [facility] Room Number: 65 Place of Service:  SNF (31) Provider:  Lennie Odor Yug Loria NP  Blanchie Serve, MD  Patient Care Team: Blanchie Serve, MD as PCP - General (Internal Medicine) Bo Merino, MD as Consulting Physician (Rheumatology) Juanita Craver, MD as Consulting Physician (Gastroenterology) Vickey Huger, MD as Consulting Physician (Orthopedic Surgery) Calvert Cantor, MD as Consulting Physician (Ophthalmology) Janaria Mccammon X, NP as Nurse Practitioner (Internal Medicine)  Extended Emergency Contact Information Primary Emergency Contact: Gruendler,Joseph Address: [address], [place] Phone: [phone] Mobile Phone: [phone] Relation: Spouse  Code Status:  DNR Goals of care: Advanced Directive information Advanced Directives 04/08/2017  Does Patient Have a Medical Advance Directive? Yes  Type of Advance Directive Living will;Healthcare Power of [place];Out of facility DNR (pink MOST or yellow form)  Does patient want to make changes to medical advance directive? No - Patient declined  Copy of [place] in Chart? Yes  Would patient like information on creating a medical advance directive? No - Patient declined  Pre-existing out of facility DNR order (yellow form or pink MOST form) Yellow form placed in chart (order not valid for inpatient use)     Chief Complaint  Patient presents with  . Acute Visit    Rt hip pain    HPI:  Pt is a 83 y.o. female seen today for an acute visit for c/o pain of the right hip, Tylenol 500mg  8:25am for pain 5/10,  12.35 noon for 10/10, the patient requesting something stronger than tylenol for pain. S/p hemiarthroplasty of the R femoral neck fracture fractures .   The right superior and inferior pubic rami fxs,  f/u Dr Amalia Hailey 04/05/17.   Recent Hx of DVT R leg 02/2017, on Eliquis.    Past Medical History:  Diagnosis Date  . Cataract    . Fibromyalgia 04/25/2014  . Hyperlipidemia   . Hypertension   . Lesion of sciatic nerve   . Low back pain   . Memory loss   . Spinal stenosis of lumbar region 04/25/2014  . Tremor   . Weakness    Past Surgical History:  Procedure Laterality Date  . ABDOMINAL HYSTERECTOMY  1963  . APPENDECTOMY    . CATARACT EXTRACTION W/ INTRAOCULAR LENS  IMPLANT, BILATERAL  2005 and 2007  . COLON SURGERY  1974   colon polyp  . COLON SURGERY  2002   colon polyps(2)  Dr Collene Mares  . COLONOSCOPY W/ POLYPECTOMY  1974and 2002  . EYE SURGERY  2005/2007   cataract renoval  Dr. Bing Plume  . HIP ARTHROPLASTY Right 04/02/2017   Procedure: ARTHROPLASTY BIPOLAR HIP (HEMIARTHROPLASTY);  Surgeon: Newt Minion, MD;  Location: [facility];  Service: Orthopedics;  Laterality: Right;  . KNEE SURGERY  2009   Dr. Ronnie Derby  . LUMBAR LAMINECTOMY  2012   L4-5 with spinous process fixation (06/22/11) Dr. Tommi Emery  . schwanoma  1987   residual right foot drop and right leg pain. Dr. Deirdre Pippins  . SMALL INTESTINE SURGERY  1990   bowel obstruction, Dr. Rebekah Chesterfield  . TONSILLECTOMY  1940    Allergies  Allergen Reactions  . Restoril [Temazepam] Other (See Comments)    Nightmares  . Bactrim [Sulfamethoxazole-Trimethoprim] Nausea And Vomiting    Outpatient Encounter Prescriptions as of 04/08/2017  Medication Sig  . acetaminophen (TYLENOL) 500 MG tablet Take 1 tablet (500 mg total) by mouth every 6 (  six) hours as needed for mild pain.  Marland Kitchen amLODipine (NORVASC) 10 MG tablet Take 1 tablet (10 mg total) by mouth daily.  Marland Kitchen apixaban (ELIQUIS) 5 MG TABS tablet Take 1 tablet (5 mg total) by mouth 2 (two) times daily.  . cholecalciferol (VITAMIN D) 1000 UNITS tablet Take 2,000 Units by mouth daily.   . diclofenac sodium (VOLTAREN) 1 % GEL APPLY 4 GRAMS TO 3 LARGE JOINTS UP TO 3 TIMES A DAY AS NEEDED  . donepezil (ARICEPT) 10 MG tablet Take 10 mg by mouth 2 (two) times daily.  . eszopiclone (LUNESTA) 1 MG TABS tablet Take 1 mg by mouth at bedtime.  Take immediately before bedtime  . fluticasone (FLONASE) 50 MCG/ACT nasal spray Place 2 sprays into both nostrils daily. 2 sprays in each nostril once daily  . gabapentin (NEURONTIN) 800 MG tablet TAKE 1 TABLET 3 TIMES A DAY.  . Lactobacillus (PROBIOTIC ACIDOPHILUS PO) Take 1 capsule by mouth daily.  Marland Kitchen losartan (COZAAR) 50 MG tablet Take 50 mg by mouth every other day.  . metoprolol tartrate (LOPRESSOR) 25 MG tablet TAKE 1 TABLET TWICE DAILY TO CONTROL BLOOD PRESSURE.  . mirabegron ER (MYRBETRIQ) 25 MG TB24 tablet Take 25 mg by mouth daily.  . sertraline (ZOLOFT) 50 MG tablet TAKE 1 TABLET EACH DAY.  . simvastatin (ZOCOR) 10 MG tablet Take 1 tablet (10 mg total) by mouth every other day.  . solifenacin (VESICARE) 10 MG tablet Take 1 tablet (10 mg total) by mouth daily.   No facility-administered encounter medications on file as of 04/08/2017.     Review of Systems  Constitutional: Positive for activity change. Negative for appetite change, chills, diaphoresis, fatigue and fever.  Respiratory: Negative for cough, chest tightness and shortness of breath.   Cardiovascular: Positive for leg swelling. Negative for chest pain and palpitations.  Gastrointestinal: Negative for abdominal distention, abdominal pain and constipation.  Genitourinary: Negative for difficulty urinating and dysuria.  Musculoskeletal: Positive for arthralgias, gait problem and joint swelling.       The right hip pain s/p ORIF  Skin: Positive for wound. Negative for pallor and rash.       Surgical wound right hip intact  Neurological: Negative for weakness and headaches.  Psychiatric/Behavioral: Negative for agitation, behavioral problems, confusion, hallucinations and sleep disturbance. The patient is not nervous/anxious.     Immunization History  Administered Date(s) Administered  . Influenza Split 04/15/2012  . Influenza-Unspecified 05/06/2016   Pertinent  Health Maintenance Due  Topic Date Due  . INFLUENZA  VACCINE  02/23/2017  . DEXA SCAN  07/26/2017 (Originally 08/21/1995)  . PNA vac Low Risk Adult (1 of 2 - PCV13) 07/26/2017 (Originally 08/21/1995)   Fall Risk  10/21/2016 04/25/2014  Falls in the past year? Yes No  Number falls in past yr: 1 -  Injury with Fall? No -  Risk for fall due to : Impaired balance/gait;Impaired mobility -  Follow up Falls evaluation completed -   Functional Status Survey:    Vitals:   04/08/17 1555  BP: 130/72  Pulse: 76  Resp: 16  Temp: 98.8 F (37.1 C)  SpO2: 93%  Weight: 189 lb 6.4 oz (85.9 kg)  Height: 5\' 8"  (1.727 m)   Body mass index is 28.8 kg/m. Physical Exam  Constitutional: She is oriented to person, place, and time. She appears well-developed and well-nourished.  HENT:  Head: Normocephalic and atraumatic.  Eyes: Pupils are equal, round, and reactive to light. Conjunctivae and EOM are normal.  Neck: Normal range of motion. Neck supple.  Cardiovascular: Normal rate and regular rhythm.   Murmur heard. Pulmonary/Chest: Effort normal and breath sounds normal. She has no wheezes. She has no rales.  Abdominal: Soft. Bowel sounds are normal. She exhibits no distension. There is no tenderness.  Musculoskeletal: She exhibits edema and tenderness.  Right hip pain, worsens with weight bearing and ROM. Trace edema BLE R>L  Neurological: She is alert and oriented to person, place, and time. She exhibits normal muscle tone.  Skin: Skin is warm and dry. There is erythema.  Right hip surgical incision intact, mild erythema peri wound, no odorous drainage.     Labs reviewed:  Recent Labs  03/11/17 0517 03/12/17 0533 03/21/17 0826  04/02/17 0406 04/03/17 0403 04/04/17 0254 04/04/17 1358  NA 142 143 142  < > 135 138 136  --   K 3.8 3.5 3.7  < > 4.1 4.8 5.2* 4.6  CL 109 106 106  < > 104 104 104  --   CO2 26 28 28   < > 26 27 28   --   GLUCOSE 96 103* 95  < > 119* 117* 104*  --   BUN 5* 7 15  < > 15 14 17   --   CREATININE 0.68 0.63 0.63  < > 0.68  0.63 0.66  --   CALCIUM 8.5* 8.5* 8.8  < > 8.7* 9.0 9.2  --   MG 2.0 2.1 2.1  --   --   --   --   --   PHOS 2.9 3.2 3.1  --   --   --   --   --   < > = values in this interval not displayed.  Recent Labs  03/11/17 0517 03/12/17 0533 03/21/17 0826  AST 24 22 15   ALT 25 26 18   ALKPHOS 47 48 56  BILITOT 0.5 0.5 0.5  PROT 5.5* 5.4* 6.2  ALBUMIN 2.7* 2.6* 3.7    Recent Labs  03/12/17 0533 03/21/17 0826 04/01/17 0530 04/01/17 0536 04/02/17 0406 04/03/17 0403  WBC 6.8 5.9 9.7  --  8.4 10.0  NEUTROABS 4.0 2,773 6.8  --   --   --   HGB 12.6 14.1 15.0 15.3* 12.8 12.0  HCT 39.1 41.7 45.7 45.0 39.8 37.8  MCV 95.4 41.7* 94.6  --  94.3 94.7  PLT 203 289 205  --  177 167   Lab Results  Component Value Date   TSH 1.897 09/13/2016   Lab Results  Component Value Date   HGBA1C 5.0 01/19/2017   Lab Results  Component Value Date   CHOL 227 (H) 01/19/2017   HDL 56 01/19/2017   LDLCALC 132 (H) 01/19/2017   TRIG 194 (H) 01/19/2017   CHOLHDL 4.1 01/19/2017    Significant Diagnostic Results in last 30 days:  Dg Chest 1 View  Result Date: 04/01/2017 CLINICAL DATA:  Fall.  Right hip fracture. EXAM: CHEST 1 VIEW COMPARISON:  CT chest and chest 03/10/2017 FINDINGS: Heart size and pulmonary vascularity are normal. Shallow inspiration with atelectasis in the right lung base. Nodular opacity in the left apex measuring 1.9 cm diameter. This was present on previous CT chest 03/10/2017 and may be decreasing in size. Follow-up CT or PET-CT scan 3 months after the previous CT remains indicated. No airspace disease or consolidation. No blunting of costophrenic angles. No pneumothorax. Calcified and tortuous aorta. IMPRESSION: Shallow inspiration with slight atelectasis in the right lung base. No evidence of active pulmonary disease.  Left apical nodule measuring 19 mm may be decreasing since a previous CT scan. Follow-up recommended. Electronically Signed   By: Lucienne Capers M.D.   On: 04/01/2017  06:15   Dg Hip Unilat W Or Wo Pelvis 2-3 Views Right  Result Date: 04/01/2017 CLINICAL DATA:  Right hip pain after a fall. EXAM: DG HIP (WITH OR WITHOUT PELVIS) 2-3V RIGHT COMPARISON:  None. FINDINGS: Acute fracture of the right femoral neck with superior subluxation of the distal fracture fragment resulting in varus angulation. No definite extension into the inter trochanteric region. No glenohumeral dislocation. Fractures of the superior and inferior right pubic rami. Sclerosis and irregularity of the right iliac wing could indicate fracture deformity or possibly bone graft donor site. This is incompletely included within the field of view. Consider CT for further evaluation clinically indicated. Postoperative changes noted in the lower lumbar spine. Surgical clips in the right pelvis. IMPRESSION: Acute fracture of the right femoral neck with varus angulation. Fractures of the right superior and inferior pubic rami. Possible fracture versus bone graft donor site in the right iliac wing. Electronically Signed   By: Lucienne Capers M.D.   On: 04/01/2017 06:11    Assessment/Plan Pubic bone fracture The right superior and inferior pubic rami fxs, pain is not well controlled, will trial Norco 5/325mg  q6h prn x 3 days, then re-eval.   Closed displaced fracture of right femoral neck (HCC) S/p hemiarthroplasty, surgical incision is intact, mild erythema peri wound, no signs of infection, pain is 10/10 while on Tylenol, will try Norco 5/325mg  q6h prn for 3 days. Observe.   Right leg DVT ([place]) 03/11/17 venous US right leg peroneal vein DVT 04/08/17 continue Eliquis, no s/s of bleeding.      Family/ staff Communication: plan of care reviewed with the patient and charge nurse.   Labs/tests ordered: none  Time spend 25 minutes.

## 2017-04-18 ENCOUNTER — Inpatient Hospital Stay (INDEPENDENT_AMBULATORY_CARE_PROVIDER_SITE_OTHER): Payer: Medicare Other | Admitting: Orthopedic Surgery

## 2017-04-25 DEATH — deceased

## 2017-05-31 ENCOUNTER — Encounter: Payer: Self-pay | Admitting: Internal Medicine

## 2018-12-07 IMAGING — CR DG CHEST 2V
2 series · 2 of 2 positions shown · non-contrast
Comparison: Portable chest 05/05/2010.

CLINICAL DATA: Weakness for 2 days. Ex-smoker with history of
hypertension.

EXAM:
CHEST  2 VIEW

[w chest lat]
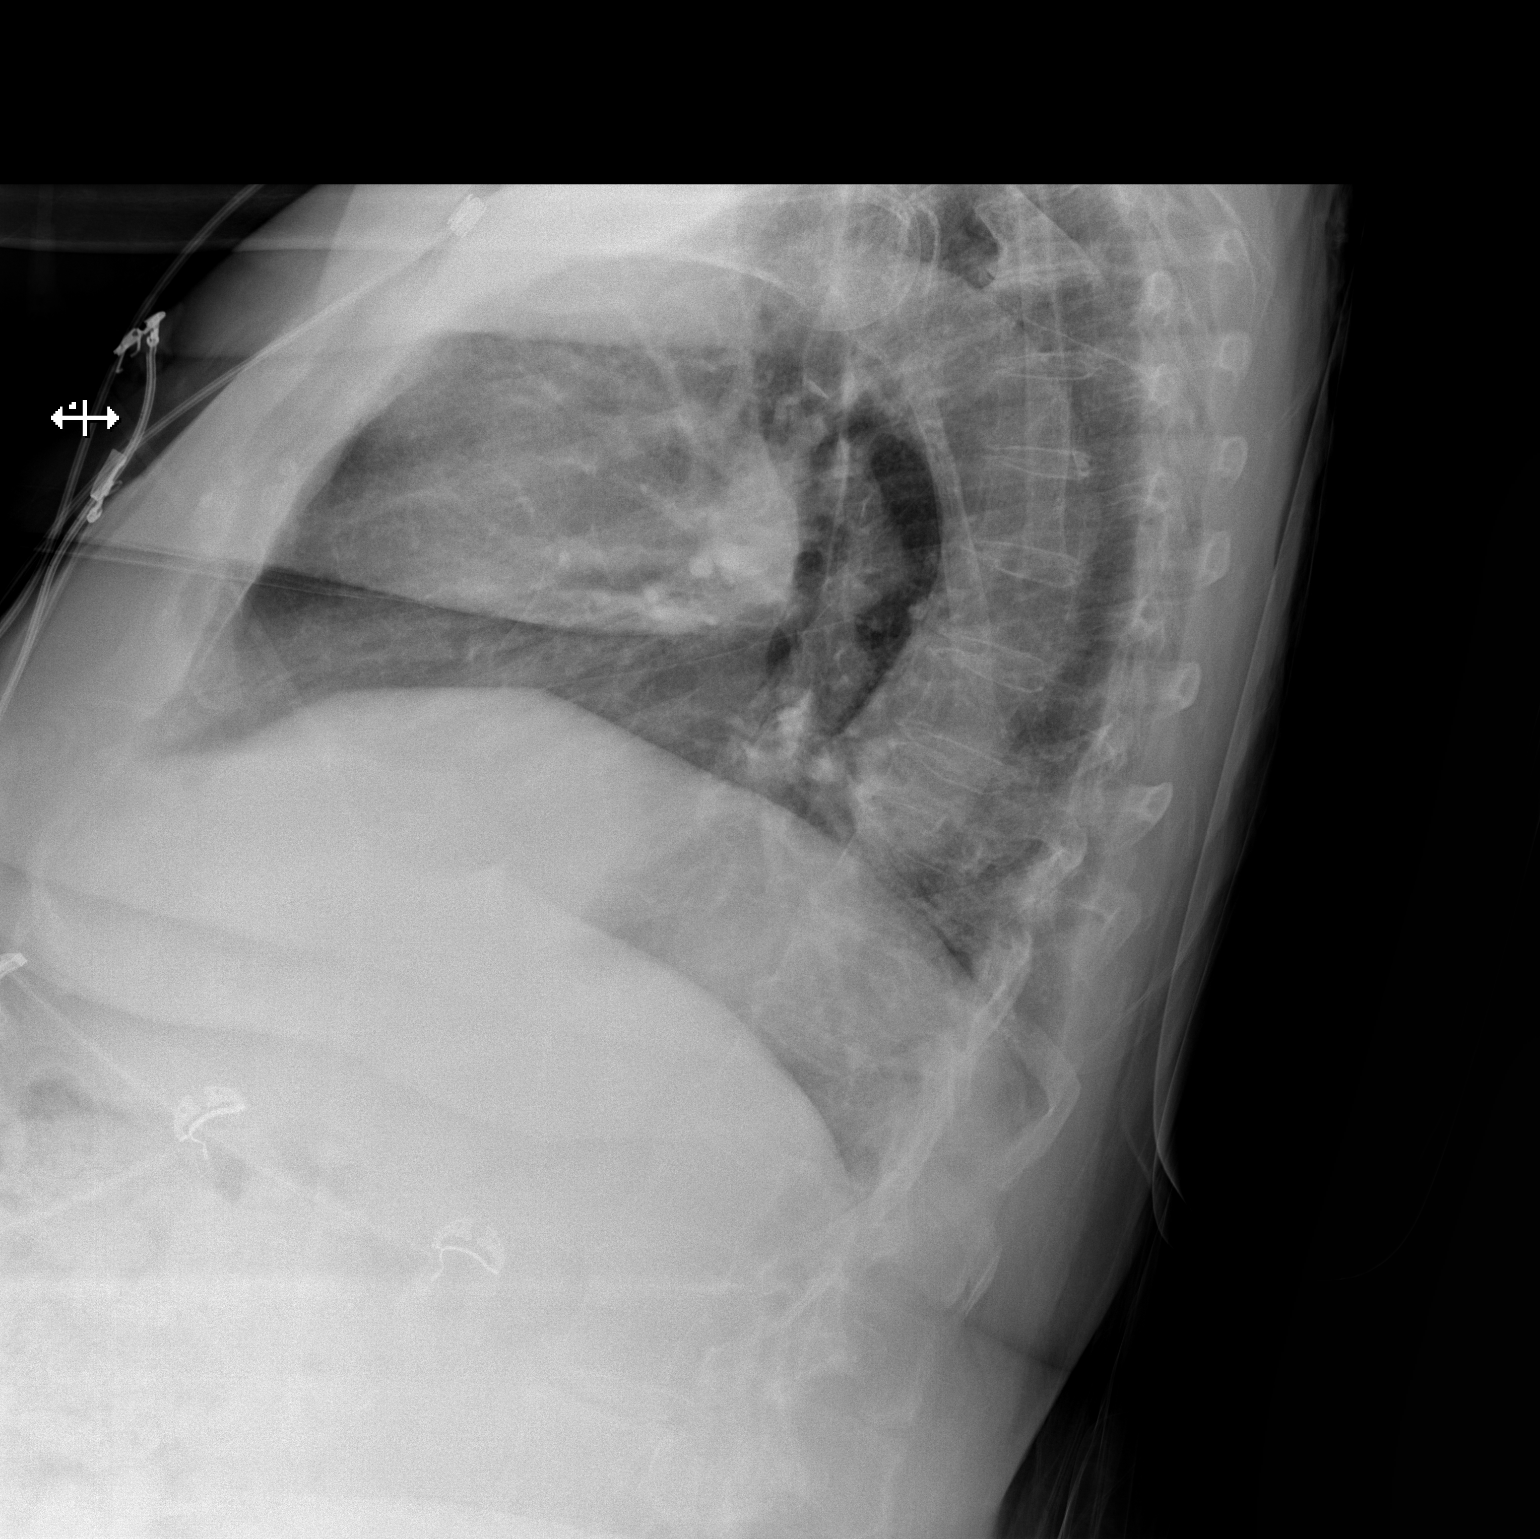

[x chest ap]
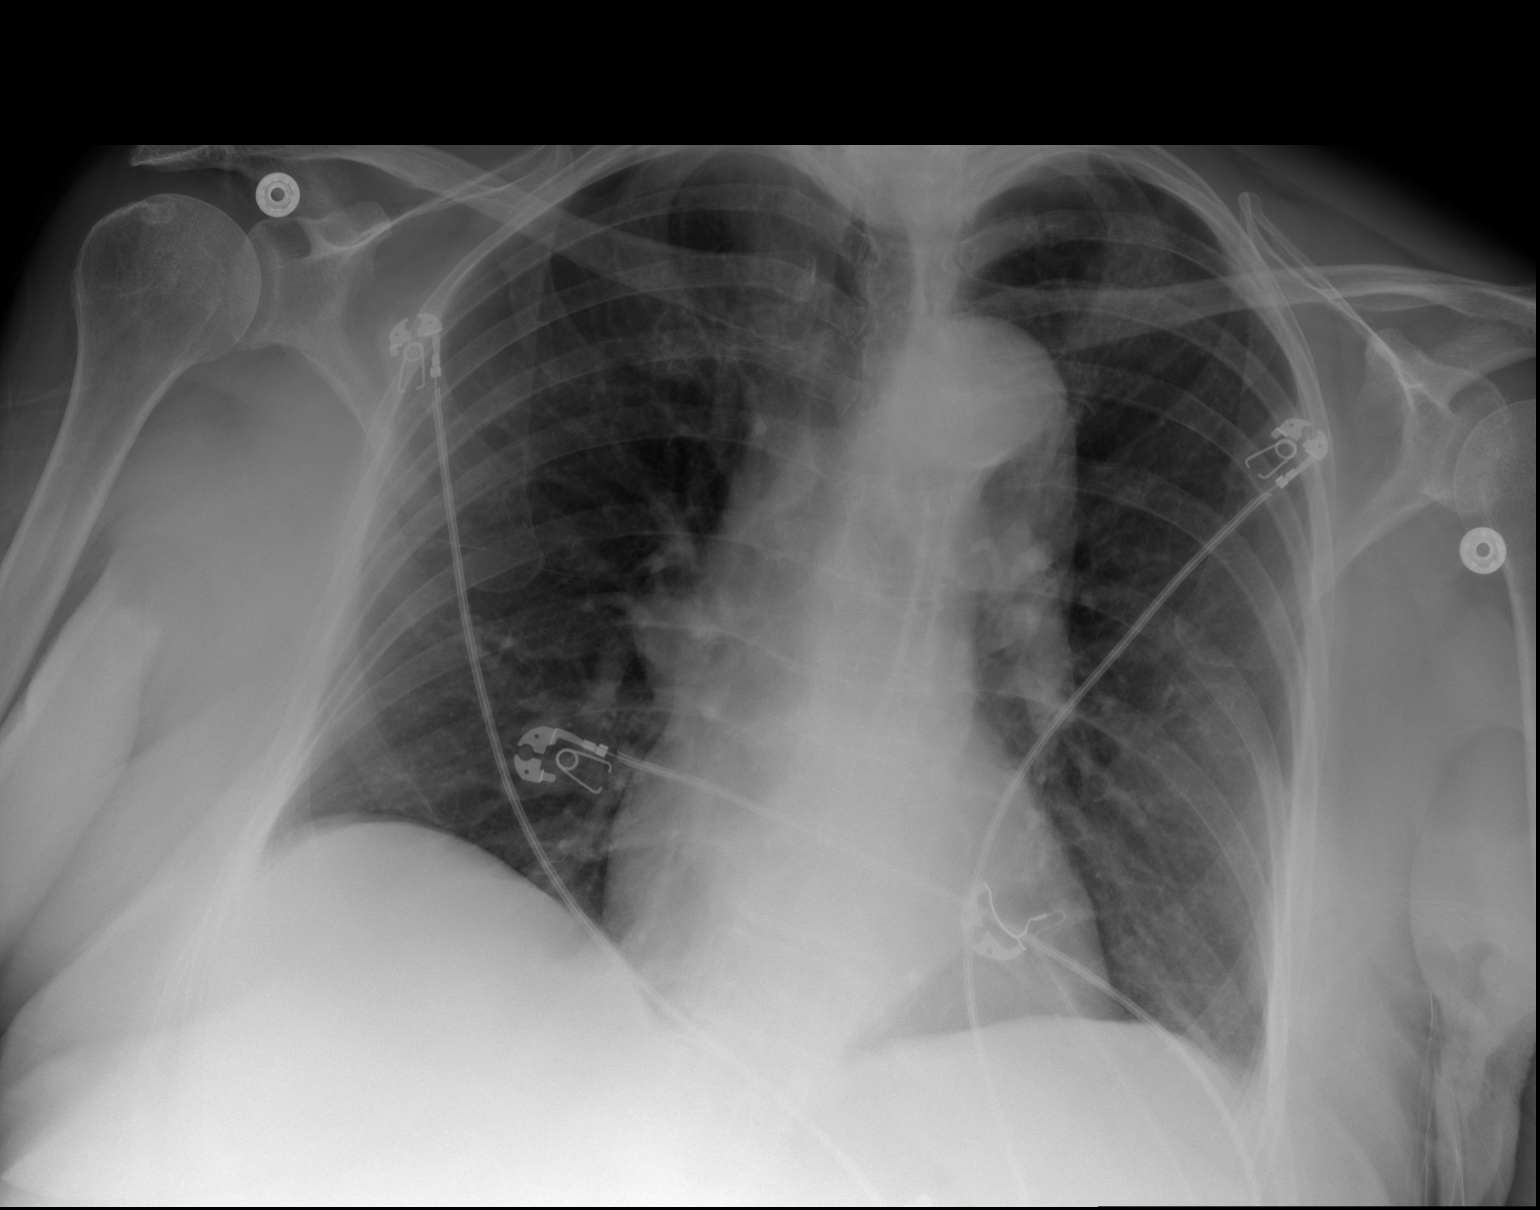

[2 of 2 positions shown; findings below may reference images not displayed]

FINDINGS: The heart size and mediastinal contours are stable. There is mild
aortic tortuosity. The lungs are clear. There is no pleural effusion
or pneumothorax. No acute osseous findings are evident. Telemetry
leads overlie the chest.
IMPRESSION: Stable chest.  No active cardiopulmonary process.
